# Patient Record
Sex: Male | Born: 1935 | Race: White | Hispanic: No | Marital: Married | Smoking: Former smoker
Health system: Southern US, Community
[De-identification: ages and names within clinical notes are randomized; demographics above are authoritative.]

## PROBLEM LIST (undated history)

## (undated) DIAGNOSIS — R7881 Bacteremia: Secondary | ICD-10-CM

## (undated) DIAGNOSIS — I509 Heart failure, unspecified: Secondary | ICD-10-CM

## (undated) DIAGNOSIS — Z8489 Family history of other specified conditions: Secondary | ICD-10-CM

## (undated) DIAGNOSIS — I472 Ventricular tachycardia, unspecified: Secondary | ICD-10-CM

## (undated) DIAGNOSIS — Z8719 Personal history of other diseases of the digestive system: Secondary | ICD-10-CM

## (undated) DIAGNOSIS — L719 Rosacea, unspecified: Secondary | ICD-10-CM

## (undated) DIAGNOSIS — N289 Disorder of kidney and ureter, unspecified: Secondary | ICD-10-CM

## (undated) DIAGNOSIS — Z87442 Personal history of urinary calculi: Secondary | ICD-10-CM

## (undated) DIAGNOSIS — G629 Polyneuropathy, unspecified: Secondary | ICD-10-CM

## (undated) DIAGNOSIS — Z95810 Presence of automatic (implantable) cardiac defibrillator: Secondary | ICD-10-CM

## (undated) DIAGNOSIS — E785 Hyperlipidemia, unspecified: Secondary | ICD-10-CM

## (undated) DIAGNOSIS — T7840XA Allergy, unspecified, initial encounter: Secondary | ICD-10-CM

## (undated) DIAGNOSIS — E349 Endocrine disorder, unspecified: Secondary | ICD-10-CM

## (undated) DIAGNOSIS — C801 Malignant (primary) neoplasm, unspecified: Secondary | ICD-10-CM

## (undated) DIAGNOSIS — E119 Type 2 diabetes mellitus without complications: Secondary | ICD-10-CM

## (undated) DIAGNOSIS — Z8711 Personal history of peptic ulcer disease: Secondary | ICD-10-CM

## (undated) DIAGNOSIS — I2510 Atherosclerotic heart disease of native coronary artery without angina pectoris: Secondary | ICD-10-CM

## (undated) DIAGNOSIS — I219 Acute myocardial infarction, unspecified: Secondary | ICD-10-CM

## (undated) DIAGNOSIS — I499 Cardiac arrhythmia, unspecified: Secondary | ICD-10-CM

## (undated) DIAGNOSIS — Z950 Presence of cardiac pacemaker: Secondary | ICD-10-CM

## (undated) DIAGNOSIS — N200 Calculus of kidney: Secondary | ICD-10-CM

## (undated) DIAGNOSIS — F419 Anxiety disorder, unspecified: Secondary | ICD-10-CM

## (undated) DIAGNOSIS — N400 Enlarged prostate without lower urinary tract symptoms: Secondary | ICD-10-CM

## (undated) DIAGNOSIS — E039 Hypothyroidism, unspecified: Secondary | ICD-10-CM

## (undated) DIAGNOSIS — R251 Tremor, unspecified: Secondary | ICD-10-CM

## (undated) DIAGNOSIS — I10 Essential (primary) hypertension: Secondary | ICD-10-CM

## (undated) DIAGNOSIS — I519 Heart disease, unspecified: Secondary | ICD-10-CM

## (undated) DIAGNOSIS — I255 Ischemic cardiomyopathy: Secondary | ICD-10-CM

## (undated) DIAGNOSIS — G500 Trigeminal neuralgia: Secondary | ICD-10-CM

## (undated) DIAGNOSIS — I4891 Unspecified atrial fibrillation: Secondary | ICD-10-CM

## (undated) DIAGNOSIS — R12 Heartburn: Secondary | ICD-10-CM

## (undated) DIAGNOSIS — B0229 Other postherpetic nervous system involvement: Secondary | ICD-10-CM

## (undated) DIAGNOSIS — K5792 Diverticulitis of intestine, part unspecified, without perforation or abscess without bleeding: Secondary | ICD-10-CM

## (undated) HISTORY — PX: CORONARY ANGIOPLASTY WITH STENT PLACEMENT: SHX49

## (undated) HISTORY — DX: Personal history of peptic ulcer disease: Z87.11

## (undated) HISTORY — PX: THYROID SURGERY: SHX805

## (undated) HISTORY — DX: Hyperlipidemia, unspecified: E78.5

## (undated) HISTORY — PX: INSERT / REPLACE / REMOVE PACEMAKER: SUR710

## (undated) HISTORY — PX: NASAL SEPTUM SURGERY: SHX37

## (undated) HISTORY — PX: OTHER SURGICAL HISTORY: SHX169

## (undated) HISTORY — PX: PACEMAKER INSERTION: SHX728

## (undated) HISTORY — DX: Cardiac arrhythmia, unspecified: I49.9

## (undated) HISTORY — DX: Anxiety disorder, unspecified: F41.9

## (undated) HISTORY — PX: CORONARY ARTERY BYPASS GRAFT: SHX141

## (undated) HISTORY — PX: CERVICAL DISCECTOMY: SHX98

## (undated) HISTORY — DX: Personal history of other diseases of the digestive system: Z87.19

## (undated) HISTORY — PX: PROSTATE SURGERY: SHX751

## (undated) HISTORY — PX: TONSILLECTOMY: SUR1361

## (undated) HISTORY — DX: Calculus of kidney: N20.0

## (undated) HISTORY — PX: CHOLECYSTECTOMY: SHX55

## (undated) HISTORY — DX: Heart disease, unspecified: I51.9

## (undated) HISTORY — PX: APPENDECTOMY: SHX54

## (undated) HISTORY — PX: BACK SURGERY: SHX140

## (undated) HISTORY — DX: Heartburn: R12

---

## 2007-07-17 DIAGNOSIS — D689 Coagulation defect, unspecified: Secondary | ICD-10-CM

## 2007-07-17 HISTORY — DX: Coagulation defect, unspecified: D68.9

## 2009-03-14 ENCOUNTER — Ambulatory Visit: Payer: Self-pay | Admitting: Pain Medicine

## 2009-04-11 ENCOUNTER — Ambulatory Visit: Payer: Self-pay | Admitting: Pain Medicine

## 2009-08-25 DIAGNOSIS — C4491 Basal cell carcinoma of skin, unspecified: Secondary | ICD-10-CM

## 2009-08-25 HISTORY — DX: Basal cell carcinoma of skin, unspecified: C44.91

## 2009-09-19 ENCOUNTER — Ambulatory Visit: Payer: Self-pay | Admitting: Pain Medicine

## 2009-12-25 ENCOUNTER — Ambulatory Visit: Payer: Self-pay | Admitting: Family Medicine

## 2010-08-15 NOTE — Assessment & Plan Note (Signed)
Summary: EYE INFECTION/JBB   Vital Signs:  Patient Profile:   77 Years Old Male CC:      infection of eyes Height:     73 inches Weight:      201 pounds Temp:     97.6 degrees F oral Pulse rate:   60 / minute Pulse rhythm:   regular Resp:     18 per minute BP sitting:   120 / 64  (left arm) Cuff size:   regular  Vitals Entered By: Providence Crosby LPN (December 25, 2009 12:02 PM)                  Current Allergies: ! CIPROHistory of Present Illness Reason for visit: infection of eyes Chief Complaint: infection of eyes History of Present Illness: swimming in pool ; has had it before  REVIEW OF SYSTEMS Constitutional Symptoms      Denies fever, chills, night sweats, weight loss, weight gain, and fatigue.  Eyes       Complains of eye pain and eye drainage.      Denies change in vision, glasses, contact lenses, and eye surgery. Ear/Nose/Throat/Mouth       Denies hearing loss/aids, change in hearing, ear pain, ear discharge, dizziness, frequent runny nose, frequent nose bleeds, sinus problems, sore throat, hoarseness, and tooth pain or bleeding.  Respiratory       Denies dry cough, productive cough, wheezing, shortness of breath, asthma, bronchitis, and emphysema/COPD.  Cardiovascular       Denies murmurs, chest pain, and tires easily with exhertion.    Gastrointestinal       Denies stomach pain, nausea/vomiting, diarrhea, constipation, blood in bowel movements, and indigestion. Genitourniary       Denies painful urination, kidney stones, and loss of urinary control. Neurological       Denies paralysis, seizures, and fainting/blackouts. Musculoskeletal       Denies muscle pain, joint pain, joint stiffness, decreased range of motion, redness, swelling, muscle weakness, and gout.  Skin       Denies bruising, unusual mles/lumps or sores, and hair/skin or nail changes.  Psych       Denies mood changes, temper/anger issues, anxiety/stress, speech problems, depression, and sleep  problems.  Past History:  Past Medical History: heart disease // pacemaker defibulator  Family History: Father:Deceased at age 24 COPD Mother: Deceased at age 69 heart disease Siblings: 1 brother 39 yoa   Social History: Marital Status: Married Children:  Occupation: retired Radio producer  Physical Exam General appearance: well developed, well nourished, no acute distress Head: normocephalic, atraumatic Eyes: injected conjunctivae Pupils: equal, round, reactive to light Ears: normal, no lesions or deformities Neck: neck supple,  trachea midline, no masses Chest/Lungs: no rales, wheezes, or rhonchi bilateral, breath sounds equal without effort Skin: Mild sunburn on face, nose Assessment New Problems: HYPERTENSION (ICD-401.1) HYPERLIPIDEMIA (ICD-272.4) DIABETES-TYPE II NIDDM, UNSPEC (ICD-250.00)   Plan New Medications/Changes: SULFACETAMIDE SODIUM 10 % SOLN (SULFACETAMIDE SODIUM) 1 or 2 drops both eyes every 4 hours  #15 mls x 0, 12/25/2009, Providence Crosby LPN   The patient and/or caregiver has been counseled thoroughly with regard to medications prescribed including dosage, schedule, interactions, rationale for use, and possible side effects and they verbalize understanding.  Diagnoses and expected course of recovery discussed and will return if not improved as expected or if the condition worsens. Patient and/or caregiver verbalized understanding.  Prescriptions: SULFACETAMIDE SODIUM 10 % SOLN (SULFACETAMIDE SODIUM) 1 or 2 drops both eyes every 4 hours  #  15 mls x 0   Entered by:   Providence Crosby LPN   Authorized by:   Kathrynn Running MD   Signed by:   Providence Crosby LPN on [id number]/9629   Method used:   Electronically to        [id number] (retail)       [address]       [place], [place]  [address]       Ph: [phone]       Fax: [phone]   RxID:   [id number]   The patient was informed that there is no on-call provider or services available at  this clinic during off-hours (when the clinic is closed).  If the patient developed a problem or concern that required immediate attention, the patient was advised to go the the nearest available urgent care or emergency department for medical care.  The patient verbalized understanding.     It was clearly explained to the patient that this [facility] is not intended to be a primary care clinic.  The patient is always better served by the continuity of care and the provider/patient relationships developed with their dedicated primary care provider.  The patient was told to be sure to follow up as soon as possible with their primary care provider to discuss treatments received and to receive further examination and testing.  The patient verbalized understanding. The will f/u with PCP ASAP.   The risks, benefits and possible side effects were clearly explained and discussed with the patient.  The patient verbalized clear understanding.  The patient was given instructions to return if symptoms don't improve, worsen or new changes develop.  If it is not during clinic hours and the patient cannot get back to this clinic then the patient was told to seek medical care at an available urgent care or emergency department.  The patient verbalized understanding.    I have reviewed the above medical office visit documention, including diagnoses, history, medications, clinical lists, orders and plan of care.   Rodney Langton, MD, FAAFP  January 09, 2010 Added new allergy or adverse reaction of CIPRO - Signed

## 2011-05-10 ENCOUNTER — Ambulatory Visit: Payer: Self-pay | Admitting: Internal Medicine

## 2011-05-17 ENCOUNTER — Ambulatory Visit: Payer: Self-pay | Admitting: Internal Medicine

## 2012-10-30 ENCOUNTER — Ambulatory Visit: Payer: Self-pay | Admitting: Unknown Physician Specialty

## 2012-10-31 LAB — PATHOLOGY REPORT

## 2012-11-04 ENCOUNTER — Ambulatory Visit: Payer: Self-pay | Admitting: Unknown Physician Specialty

## 2013-04-07 DIAGNOSIS — C4490 Unspecified malignant neoplasm of skin, unspecified: Secondary | ICD-10-CM

## 2013-04-07 HISTORY — DX: Unspecified malignant neoplasm of skin, unspecified: C44.90

## 2013-09-24 ENCOUNTER — Ambulatory Visit: Payer: Self-pay | Admitting: Unknown Physician Specialty

## 2014-07-14 ENCOUNTER — Telehealth: Payer: Self-pay | Admitting: Neurology

## 2014-07-14 NOTE — Telephone Encounter (Signed)
Pt called/returning your call. C/B [phone]

## 2014-08-31 DIAGNOSIS — I25118 Atherosclerotic heart disease of native coronary artery with other forms of angina pectoris: Secondary | ICD-10-CM | POA: Diagnosis not present

## 2014-08-31 DIAGNOSIS — Z8679 Personal history of other diseases of the circulatory system: Secondary | ICD-10-CM | POA: Diagnosis not present

## 2014-08-31 DIAGNOSIS — E782 Mixed hyperlipidemia: Secondary | ICD-10-CM | POA: Diagnosis not present

## 2014-08-31 DIAGNOSIS — I10 Essential (primary) hypertension: Secondary | ICD-10-CM | POA: Diagnosis not present

## 2014-09-28 DIAGNOSIS — I255 Ischemic cardiomyopathy: Secondary | ICD-10-CM | POA: Diagnosis not present

## 2014-09-28 DIAGNOSIS — Z125 Encounter for screening for malignant neoplasm of prostate: Secondary | ICD-10-CM | POA: Diagnosis not present

## 2014-09-28 DIAGNOSIS — E039 Hypothyroidism, unspecified: Secondary | ICD-10-CM | POA: Diagnosis not present

## 2014-09-28 DIAGNOSIS — I10 Essential (primary) hypertension: Secondary | ICD-10-CM | POA: Diagnosis not present

## 2014-09-28 DIAGNOSIS — I472 Ventricular tachycardia: Secondary | ICD-10-CM | POA: Diagnosis not present

## 2014-09-28 DIAGNOSIS — E119 Type 2 diabetes mellitus without complications: Secondary | ICD-10-CM | POA: Diagnosis not present

## 2014-10-05 DIAGNOSIS — E782 Mixed hyperlipidemia: Secondary | ICD-10-CM | POA: Diagnosis not present

## 2014-10-05 DIAGNOSIS — E039 Hypothyroidism, unspecified: Secondary | ICD-10-CM | POA: Diagnosis not present

## 2014-10-05 DIAGNOSIS — E119 Type 2 diabetes mellitus without complications: Secondary | ICD-10-CM | POA: Diagnosis not present

## 2014-10-05 DIAGNOSIS — I25810 Atherosclerosis of coronary artery bypass graft(s) without angina pectoris: Secondary | ICD-10-CM | POA: Diagnosis not present

## 2015-01-04 DIAGNOSIS — I472 Ventricular tachycardia: Secondary | ICD-10-CM | POA: Diagnosis not present

## 2015-03-03 DIAGNOSIS — I472 Ventricular tachycardia: Secondary | ICD-10-CM | POA: Diagnosis not present

## 2015-03-03 DIAGNOSIS — I255 Ischemic cardiomyopathy: Secondary | ICD-10-CM | POA: Diagnosis not present

## 2015-03-03 DIAGNOSIS — E782 Mixed hyperlipidemia: Secondary | ICD-10-CM | POA: Diagnosis not present

## 2015-03-03 DIAGNOSIS — I10 Essential (primary) hypertension: Secondary | ICD-10-CM | POA: Diagnosis not present

## 2015-03-15 DIAGNOSIS — I25810 Atherosclerosis of coronary artery bypass graft(s) without angina pectoris: Secondary | ICD-10-CM | POA: Diagnosis not present

## 2015-03-15 DIAGNOSIS — I213 ST elevation (STEMI) myocardial infarction of unspecified site: Secondary | ICD-10-CM | POA: Diagnosis not present

## 2015-03-15 DIAGNOSIS — I255 Ischemic cardiomyopathy: Secondary | ICD-10-CM | POA: Diagnosis not present

## 2015-03-24 DIAGNOSIS — I480 Paroxysmal atrial fibrillation: Secondary | ICD-10-CM | POA: Diagnosis not present

## 2015-04-07 DIAGNOSIS — H60332 Swimmer's ear, left ear: Secondary | ICD-10-CM | POA: Diagnosis not present

## 2015-04-07 DIAGNOSIS — I480 Paroxysmal atrial fibrillation: Secondary | ICD-10-CM | POA: Diagnosis not present

## 2015-04-07 DIAGNOSIS — H6123 Impacted cerumen, bilateral: Secondary | ICD-10-CM | POA: Diagnosis not present

## 2015-04-12 DIAGNOSIS — I255 Ischemic cardiomyopathy: Secondary | ICD-10-CM | POA: Diagnosis not present

## 2015-04-12 DIAGNOSIS — I213 ST elevation (STEMI) myocardial infarction of unspecified site: Secondary | ICD-10-CM | POA: Diagnosis not present

## 2015-04-12 DIAGNOSIS — I25810 Atherosclerosis of coronary artery bypass graft(s) without angina pectoris: Secondary | ICD-10-CM | POA: Diagnosis not present

## 2015-04-12 DIAGNOSIS — I472 Ventricular tachycardia: Secondary | ICD-10-CM | POA: Diagnosis not present

## 2015-05-05 DIAGNOSIS — I480 Paroxysmal atrial fibrillation: Secondary | ICD-10-CM | POA: Diagnosis not present

## 2015-05-24 DIAGNOSIS — I480 Paroxysmal atrial fibrillation: Secondary | ICD-10-CM | POA: Diagnosis not present

## 2015-06-04 DIAGNOSIS — Z794 Long term (current) use of insulin: Secondary | ICD-10-CM | POA: Diagnosis not present

## 2015-06-04 DIAGNOSIS — R739 Hyperglycemia, unspecified: Secondary | ICD-10-CM | POA: Diagnosis not present

## 2015-06-04 DIAGNOSIS — E119 Type 2 diabetes mellitus without complications: Secondary | ICD-10-CM | POA: Diagnosis not present

## 2015-06-06 DIAGNOSIS — H5210 Myopia, unspecified eye: Secondary | ICD-10-CM | POA: Diagnosis not present

## 2015-06-06 DIAGNOSIS — Z794 Long term (current) use of insulin: Secondary | ICD-10-CM | POA: Diagnosis not present

## 2015-06-06 DIAGNOSIS — E119 Type 2 diabetes mellitus without complications: Secondary | ICD-10-CM | POA: Diagnosis not present

## 2015-06-06 DIAGNOSIS — H524 Presbyopia: Secondary | ICD-10-CM | POA: Diagnosis not present

## 2015-06-16 DIAGNOSIS — I480 Paroxysmal atrial fibrillation: Secondary | ICD-10-CM | POA: Diagnosis not present

## 2015-06-21 ENCOUNTER — Emergency Department
Admission: EM | Admit: 2015-06-21 | Discharge: 2015-06-21 | Disposition: A | Discharge status: Home / Self Care | Payer: Commercial Managed Care - HMO | Attending: Emergency Medicine | Admitting: Emergency Medicine

## 2015-06-21 ENCOUNTER — Emergency Department: Payer: Commercial Managed Care - HMO

## 2015-06-21 ENCOUNTER — Encounter: Payer: Self-pay | Admitting: Emergency Medicine

## 2015-06-21 DIAGNOSIS — R309 Painful micturition, unspecified: Secondary | ICD-10-CM | POA: Insufficient documentation

## 2015-06-21 DIAGNOSIS — I10 Essential (primary) hypertension: Secondary | ICD-10-CM | POA: Insufficient documentation

## 2015-06-21 DIAGNOSIS — Z87891 Personal history of nicotine dependence: Secondary | ICD-10-CM | POA: Diagnosis not present

## 2015-06-21 DIAGNOSIS — R102 Pelvic and perineal pain: Secondary | ICD-10-CM | POA: Insufficient documentation

## 2015-06-21 DIAGNOSIS — R059 Cough, unspecified: Secondary | ICD-10-CM

## 2015-06-21 DIAGNOSIS — E1165 Type 2 diabetes mellitus with hyperglycemia: Secondary | ICD-10-CM | POA: Diagnosis not present

## 2015-06-21 DIAGNOSIS — Z794 Long term (current) use of insulin: Secondary | ICD-10-CM | POA: Diagnosis not present

## 2015-06-21 DIAGNOSIS — R002 Palpitations: Secondary | ICD-10-CM | POA: Insufficient documentation

## 2015-06-21 DIAGNOSIS — Z950 Presence of cardiac pacemaker: Secondary | ICD-10-CM | POA: Diagnosis not present

## 2015-06-21 DIAGNOSIS — R1030 Lower abdominal pain, unspecified: Secondary | ICD-10-CM | POA: Insufficient documentation

## 2015-06-21 DIAGNOSIS — N23 Unspecified renal colic: Secondary | ICD-10-CM | POA: Diagnosis not present

## 2015-06-21 DIAGNOSIS — R05 Cough: Secondary | ICD-10-CM | POA: Diagnosis not present

## 2015-06-21 HISTORY — DX: Type 2 diabetes mellitus without complications: E11.9

## 2015-06-21 HISTORY — DX: Essential (primary) hypertension: I10

## 2015-06-21 LAB — CBC
HCT: 44.8 % (ref 40.0–52.0)
Hemoglobin: 14.8 g/dL (ref 13.0–18.0)
MCH: 31.4 pg (ref 26.0–34.0)
MCHC: 33 g/dL (ref 32.0–36.0)
MCV: 95.1 fL (ref 80.0–100.0)
Platelets: 147 10*3/uL — ABNORMAL LOW (ref 150–440)
RBC: 4.72 MIL/uL (ref 4.40–5.90)
RDW: 13.8 % (ref 11.5–14.5)
WBC: 5.5 10*3/uL (ref 3.8–10.6)

## 2015-06-21 LAB — URINALYSIS COMPLETE WITH MICROSCOPIC (ARMC ONLY)
Bacteria, UA: NONE SEEN
Bilirubin Urine: NEGATIVE
Glucose, UA: >500 mg/dL — AB
Hgb urine dipstick: NEGATIVE
Ketones, ur: NEGATIVE mg/dL
Leukocytes, UA: NEGATIVE
Nitrite: NEGATIVE
Protein, ur: NEGATIVE mg/dL
Specific Gravity, Urine: 1.023 (ref 1.005–1.030)
Squamous Epithelial / LPF: NONE SEEN
pH: 5 (ref 5.0–8.0)

## 2015-06-21 LAB — GLUCOSE, CAPILLARY
Glucose-Capillary: 340 mg/dL — ABNORMAL HIGH (ref 65–99)
Glucose-Capillary: 159 mg/dL — ABNORMAL HIGH (ref 65–99)

## 2015-06-21 LAB — BASIC METABOLIC PANEL
Anion gap: 8 (ref 5–15)
BUN: 17 mg/dL (ref 6–20)
CO2: 28 mmol/L (ref 22–32)
Calcium: 9.6 mg/dL (ref 8.9–10.3)
Chloride: 101 mmol/L (ref 101–111)
Creatinine, Ser: 0.9 mg/dL (ref 0.61–1.24)
GFR calc Af Amer: >60 mL/min (ref >60)
GFR calc non Af Amer: >60 mL/min (ref >60)
Glucose, Bld: 342 mg/dL — ABNORMAL HIGH (ref 65–99)
Potassium: 3.6 mmol/L (ref 3.5–5.1)
Sodium: 137 mmol/L (ref 135–145)

## 2015-06-21 LAB — TROPONIN I: Troponin I: <0.03 ng/mL (ref <0.031)

## 2015-06-21 MED ORDER — INSULIN ASPART 100 UNIT/ML ~~LOC~~ SOLN
10.0000 [IU] | Freq: Once | SUBCUTANEOUS | Status: AC
  Administered 2015-06-21: 10 [IU] via SUBCUTANEOUS
  Filled 2015-06-21: qty 10

## 2015-06-21 MED ORDER — SODIUM CHLORIDE 0.9 % IV BOLUS (SEPSIS)
1000.0000 mL | Freq: Once | INTRAVENOUS | Status: AC
  Administered 2015-06-21: 1000 mL via INTRAVENOUS

## 2015-06-21 NOTE — ED Provider Notes (Signed)
[facility] Emergency Department Provider Note  ____________________________________________  Time seen: Approximately 9:40 PM  I have reviewed the triage vital signs and the nursing notes.   HISTORY  Chief Complaint Hyperglycemia    HPI LARWENCE Thomas is a 81 y.o. male with a history of DM with multiple recent medication changes presenting with hyperglycemia patient states that 6 months ago he was removed from insulin and switched over to metformin, to which she was allergic. He was then stabilized on glipizide for several months but began to feel poorly and was noted to be hyperglycemic. He was then taken off glipizide and put on 10 units of Lantus at night to be increased eye 2 units daily until glycemic control was obtained. He is currently at 56 units of Lantus at night and continues to have blood sugars into the 300s. He has noted painful burning with urination and some mild right lower quadrant and suprapubic pain with urination only. He has also had a cough that is occasionally productive of mucus without any fever, chills, or shortness of breath. He has not had any chest pain other than some localized pain around his pacemaker which is chronic. He has tried to see his primary care physician who has been out of the country.   Past Medical History  Diagnosis Date  . Diabetes mellitus without complication ([place])   . Hypertension     There are no active problems to display for this patient.   No past surgical history on file.  No current outpatient prescriptions on file.  Allergies Ciprofloxacin  No family history on file.  Social History Social History  Substance Use Topics  . Smoking status: Former Research scientist (life sciences)  . Smokeless tobacco: None  . Alcohol Use: Yes    Review of Systems Constitutional: No fever/chills. No lightheadedness or syncope. Eyes: No visual changes. ENT: No sore throat. No congestion. No ear pain. Cardiovascular: Denies chest  pain, positive subcutaneous pain around the area of his pacemaker, palpitations. Respiratory: Denies shortness of breath.  Positive productive cough. Gastrointestinal: No abdominal pain.  No nausea, no vomiting.  No diarrhea.  No constipation. Genitourinary: Positive for pain with urination and mild superior pubic and lower right pelvic pain with urination. No hematuria. Musculoskeletal: Negative for back pain. Skin: Negative for rash. Neurological: Negative for headaches, focal weakness or numbness.  10-point ROS otherwise negative.  ____________________________________________   PHYSICAL EXAM:  VITAL SIGNS: ED Triage Vitals  Enc Vitals Group     BP 06/21/15 1811 169/81 mmHg     Pulse Rate 06/21/15 1811 69     Resp 06/21/15 1811 18     Temp 06/21/15 1811 97.9 F (36.6 C)     Temp Source 06/21/15 1811 Oral     SpO2 06/21/15 1811 98 %     Weight 06/21/15 1811 202 lb (91.627 kg)     Height 06/21/15 1811 6\' 1"  (1.854 m)     Head Cir --      Peak Flow --      Pain Score 06/21/15 2111 1     Pain Loc --      Pain Edu? --      Excl. in [place]? --     Constitutional: Alert and oriented. Well appearing and in no acute distress. Answer question appropriately. Eyes: Conjunctivae are normal.  EOMI. no scleral icterus. Head: Atraumatic. Nose: No congestion/rhinnorhea. Mouth/Throat: Mucous membranes are moist.  Neck: No stridor.  Supple.  No JVD. Cardiovascular: Normal rate, regular  rhythm. No murmurs, rubs or gallops. Pacemaker placement in the left upper chest without any evidence of swelling, erythema or fluctuance over the insertion site. Respiratory: Normal respiratory effort.  No retractions. Lungs CTAB.  No wheezes, rales or ronchi. Gastrointestinal: Soft and nontender. No distention. No peritoneal signs. Abdominal pain is not reproducible on exam. Musculoskeletal: No LE edema.  Neurologic:  Normal speech and language. No gross focal neurologic deficits are appreciated.  Skin:   Skin is warm, dry and intact. No rash noted. Psychiatric: Mood and affect are normal. Speech and behavior are normal.  Normal judgement.  ____________________________________________   LABS (all labs ordered are listed, but only abnormal results are displayed)  Labs Reviewed  BASIC METABOLIC PANEL - Abnormal; Notable for the following:    Glucose, Bld 342 (*)    All other components within normal limits  GLUCOSE, CAPILLARY - Abnormal; Notable for the following:    Glucose-Capillary 340 (*)    All other components within normal limits  CBC - Abnormal; Notable for the following:    Platelets 147 (*)    All other components within normal limits  BLOOD GAS, VENOUS - Abnormal; Notable for the following:    pCO2, Ven 62 (*)    Bicarbonate 34.2 (*)    Acid-Base Excess 6.5 (*)    All other components within normal limits  URINALYSIS COMPLETEWITH MICROSCOPIC ([facility] ONLY) - Abnormal; Notable for the following:    Color, Urine YELLOW (*)    APPearance CLEAR (*)    Glucose, UA >500 (*)    All other components within normal limits  TROPONIN I  CBG MONITORING, ED   ____________________________________________  EKG  ED ECG REPORT I, Eula Listen, the attending physician, personally viewed and interpreted this ECG.   Date: 06/21/2015  EKG Time: 1818  Rate: 68  Rhythm: normal sinus rhythm; occasional PVCs.  Axis: Normal  Intervals:  ST&T Change: No ST elevation.  ____________________________________________  RADIOLOGY  Dg Chest 2 View  06/21/2015  CLINICAL DATA:  Cough. Weakness. Hyperglycemia. Coronary artery disease. EXAM: CHEST  2 VIEW COMPARISON:  None. FINDINGS: Heart size is within normal limits. AICD seen in expected position. Prior CABG noted. Ectasia of the thoracic aorta is demonstrated. Both lungs are clear. No evidence of pneumothorax or pleural effusion. IMPRESSION: No active cardiopulmonary disease. Electronically Signed   By: Earle Gell M.D.   On: 06/21/2015  22:08    ____________________________________________   PROCEDURES  Procedure(s) performed: None  Critical Care performed: No ____________________________________________   INITIAL IMPRESSION / ASSESSMENT AND PLAN / ED COURSE  Pertinent labs & imaging results that were available during my care of the patient were reviewed by me and considered in my medical decision making (see chart for details).  81 y.o. male with a history of diabetes and multiple medication changes over the past few months presenting with hyperglycemia. He does have painful urination as well as a cough. I will look for other possible causes of hypoglycemia including infection. If his workup here is negative, I will plan to have him follow-up with Dr. Carmie End for medication changes.  ----------------------------------------- 10:48 PM on 06/21/2015 -----------------------------------------  The patient has hyperglycemia without DKA. His urinalysis is not suspicious for UTI. He does not have evidence of pneumonia on chest x-ray or on my clinical exam. I'll plan to discharge home with follow-up with his PMD in 1-2 days to revise his insulin regimen.  ____________________________________________  FINAL CLINICAL IMPRESSION(S) / ED DIAGNOSES  Final diagnoses:  Type 2  diabetes mellitus with hyperglycemia, with long-term current use of insulin (HCC)  Urinary pain  Cough      NEW MEDICATIONS STARTED DURING THIS VISIT:  New Prescriptions   No medications on file     Eula Listen, MD 06/21/15 2249

## 2015-06-21 NOTE — ED Notes (Signed)
Pt discharged home.  Pt in NAD.  Discharge instructions given to pt.  Pt voiced understanding.  No questions or concerns at this time.  Items with pt upon discharge.  No items left in ED.

## 2015-06-21 NOTE — Discharge Instructions (Signed)
Please make an appointment to see your regular doctor in the next 1-2 days to reevaluate your insulin regimen.   Please return to the emergency department if you develop nausea or vomiting, abdominal pain, fever, fainting, or any other symptoms concerning to you.

## 2015-06-21 NOTE — ED Notes (Signed)
Pt sent over for high blood sugar. Pt brought over from [facility]

## 2015-06-23 DIAGNOSIS — I472 Ventricular tachycardia: Secondary | ICD-10-CM | POA: Diagnosis not present

## 2015-06-23 LAB — BLOOD GAS, VENOUS
Acid-Base Excess: 6.5 mmol/L — ABNORMAL HIGH (ref 0.0–3.0)
Bicarbonate: 34.2 mEq/L — ABNORMAL HIGH (ref 21.0–28.0)
Patient temperature: 37
pCO2, Ven: 62 mmHg — ABNORMAL HIGH (ref 44.0–60.0)
pH, Ven: 7.35 (ref 7.320–7.430)

## 2015-06-24 DIAGNOSIS — Z794 Long term (current) use of insulin: Secondary | ICD-10-CM | POA: Diagnosis not present

## 2015-06-24 DIAGNOSIS — E782 Mixed hyperlipidemia: Secondary | ICD-10-CM | POA: Diagnosis not present

## 2015-06-24 DIAGNOSIS — I25810 Atherosclerosis of coronary artery bypass graft(s) without angina pectoris: Secondary | ICD-10-CM | POA: Diagnosis not present

## 2015-06-24 DIAGNOSIS — J302 Other seasonal allergic rhinitis: Secondary | ICD-10-CM | POA: Diagnosis not present

## 2015-06-24 DIAGNOSIS — N200 Calculus of kidney: Secondary | ICD-10-CM | POA: Diagnosis not present

## 2015-06-24 DIAGNOSIS — I10 Essential (primary) hypertension: Secondary | ICD-10-CM | POA: Diagnosis not present

## 2015-06-24 DIAGNOSIS — E119 Type 2 diabetes mellitus without complications: Secondary | ICD-10-CM | POA: Diagnosis not present

## 2015-07-01 DIAGNOSIS — J069 Acute upper respiratory infection, unspecified: Secondary | ICD-10-CM | POA: Diagnosis not present

## 2015-07-01 DIAGNOSIS — R509 Fever, unspecified: Secondary | ICD-10-CM | POA: Diagnosis not present

## 2015-07-01 DIAGNOSIS — Z7901 Long term (current) use of anticoagulants: Secondary | ICD-10-CM | POA: Diagnosis not present

## 2015-07-02 ENCOUNTER — Other Ambulatory Visit: Payer: Self-pay

## 2015-07-02 ENCOUNTER — Emergency Department
Admission: EM | Admit: 2015-07-02 | Discharge: 2015-07-02 | Disposition: A | Discharge status: Home / Self Care | Payer: Commercial Managed Care - HMO | Attending: Emergency Medicine | Admitting: Emergency Medicine

## 2015-07-02 ENCOUNTER — Emergency Department: Payer: Commercial Managed Care - HMO

## 2015-07-02 DIAGNOSIS — I10 Essential (primary) hypertension: Secondary | ICD-10-CM | POA: Insufficient documentation

## 2015-07-02 DIAGNOSIS — Z87891 Personal history of nicotine dependence: Secondary | ICD-10-CM | POA: Diagnosis not present

## 2015-07-02 DIAGNOSIS — B349 Viral infection, unspecified: Secondary | ICD-10-CM | POA: Insufficient documentation

## 2015-07-02 DIAGNOSIS — J209 Acute bronchitis, unspecified: Secondary | ICD-10-CM | POA: Diagnosis not present

## 2015-07-02 DIAGNOSIS — E119 Type 2 diabetes mellitus without complications: Secondary | ICD-10-CM | POA: Insufficient documentation

## 2015-07-02 DIAGNOSIS — S299XXA Unspecified injury of thorax, initial encounter: Secondary | ICD-10-CM | POA: Diagnosis not present

## 2015-07-02 DIAGNOSIS — R05 Cough: Secondary | ICD-10-CM | POA: Diagnosis present

## 2015-07-02 LAB — BASIC METABOLIC PANEL
Anion gap: 8 (ref 5–15)
BUN: 14 mg/dL (ref 6–20)
CO2: 29 mmol/L (ref 22–32)
Calcium: 9.1 mg/dL (ref 8.9–10.3)
Chloride: 99 mmol/L — ABNORMAL LOW (ref 101–111)
Creatinine, Ser: 1 mg/dL (ref 0.61–1.24)
GFR calc Af Amer: >60 mL/min (ref >60)
GFR calc non Af Amer: >60 mL/min (ref >60)
Glucose, Bld: 227 mg/dL — ABNORMAL HIGH (ref 65–99)
Potassium: 3.3 mmol/L — ABNORMAL LOW (ref 3.5–5.1)
Sodium: 136 mmol/L (ref 135–145)

## 2015-07-02 LAB — CBC
HCT: 43.7 % (ref 40.0–52.0)
Hemoglobin: 14.2 g/dL (ref 13.0–18.0)
MCH: 30.8 pg (ref 26.0–34.0)
MCHC: 32.4 g/dL (ref 32.0–36.0)
MCV: 94.9 fL (ref 80.0–100.0)
Platelets: 126 10*3/uL — ABNORMAL LOW (ref 150–440)
RBC: 4.6 MIL/uL (ref 4.40–5.90)
RDW: 13.6 % (ref 11.5–14.5)
WBC: 9.4 10*3/uL (ref 3.8–10.6)

## 2015-07-02 MED ORDER — RANITIDINE HCL 150 MG PO CAPS: 150.0000 mg | ORAL_CAPSULE | Freq: Two times a day (BID) | ORAL | Status: DC

## 2015-07-02 MED ORDER — ALBUTEROL SULFATE HFA 108 (90 BASE) MCG/ACT IN AERS: 2.0000 | INHALATION_SPRAY | RESPIRATORY_TRACT | Status: DC | PRN

## 2015-07-02 MED ORDER — DEXAMETHASONE SODIUM PHOSPHATE 10 MG/ML IJ SOLN
10.0000 mg | Freq: Once | INTRAMUSCULAR | Status: AC
  Administered 2015-07-02: 10 mg via INTRAMUSCULAR
  Filled 2015-07-02: qty 1

## 2015-07-02 MED ORDER — FAMOTIDINE 20 MG PO TABS
40.0000 mg | ORAL_TABLET | Freq: Once | ORAL | Status: AC
  Administered 2015-07-02: 40 mg via ORAL
  Filled 2015-07-02: qty 2

## 2015-07-02 MED ORDER — ONDANSETRON 8 MG PO TBDP: 8.0000 mg | ORAL_TABLET | Freq: Three times a day (TID) | ORAL | Status: DC | PRN

## 2015-07-02 NOTE — ED Notes (Signed)
Pt placed on med hold until 1915, pt made aware and verbalized understanding at this time.

## 2015-07-02 NOTE — ED Provider Notes (Signed)
[facility] Emergency Department Provider Note  ____________________________________________  Time seen: 6:20 PM  I have reviewed the triage vital signs and the nursing notes.   HISTORY  Chief Complaint Cough and URI    HPI Dakota Thomas is a 81 y.o. male reports that his wife was sick with a cold about a week ago. Now for the past 2 days he is having generalized weakness, coughing that is worse at night but nonproductive, nausea, diarrhea today that he took antidiarrheals 4. Denies chest pain or shortness of breath. He saw primary care yesterday who started him on Tessalon and azithromycin. He reports that he still not feeling better. He has fallen twice in the last 2 days but denies any serious injuries. Denies syncope but states that he feels a little bit dizzy when he stands up.     Past Medical History  Diagnosis Date  . Diabetes mellitus without complication ([place])   . Hypertension      There are no active problems to display for this patient.    History reviewed. No pertinent past surgical history.   Current Outpatient Rx  Name  Route  Sig  Dispense  Refill  . albuterol (PROVENTIL HFA) 108 (90 BASE) MCG/ACT inhaler   Inhalation   Inhale 2 puffs into the lungs every 4 (four) hours as needed for wheezing or shortness of breath.   1 Inhaler   0   . ondansetron (ZOFRAN ODT) 8 MG disintegrating tablet   Oral   Take 1 tablet (8 mg total) by mouth every 8 (eight) hours as needed for nausea or vomiting.   20 tablet   0   . ranitidine (ZANTAC) 150 MG capsule   Oral   Take 1 capsule (150 mg total) by mouth 2 (two) times daily.   28 capsule   0      Allergies Ciprofloxacin   No family history on file.  Social History Social History  Substance Use Topics  . Smoking status: Former Research scientist (life sciences)  . Smokeless tobacco: None  . Alcohol Use: Yes    Review of Systems  Constitutional:   No fever or chills. No weight changes Eyes:   No  blurry vision or double vision.  ENT:   Positive sore throat. Cardiovascular:   No chest pain. Respiratory:   No dyspnea positive nonproductive cough. Gastrointestinal:   Positive for upper abdominal pain, with diarrhea.  No BRBPR or melena. Genitourinary:   Negative for dysuria, urinary retention, bloody urine, or difficulty urinating. Musculoskeletal:   Negative for back pain. No joint swelling or pain. Skin:   Negative for rash. Neurological:   Negative for headaches, focal weakness or numbness. Psychiatric:  No anxiety or depression.   Endocrine:  No hot/cold intolerance, changes in energy, or sleep difficulty.  10-point ROS otherwise negative.  ____________________________________________   PHYSICAL EXAM:  VITAL SIGNS: ED Triage Vitals  Enc Vitals Group     BP 07/02/15 1549 105/68 mmHg     Pulse Rate 07/02/15 1549 91     Resp 07/02/15 1549 20     Temp 07/02/15 1549 98.9 F (37.2 C)     Temp Source 07/02/15 1549 Oral     SpO2 07/02/15 1549 96 %     Weight 07/02/15 1549 220 lb (99.791 kg)     Height 07/02/15 1549 6\' 1"  (1.854 m)     Head Cir --      Peak Flow --      Pain Score 07/02/15  1549 6     Pain Loc --      Pain Edu? --      Excl. in [place]? --     Vital signs reviewed, nursing assessments reviewed.   Constitutional:   Alert and oriented. Well appearing and in no distress. Eyes:   No scleral icterus. No conjunctival pallor. PERRL. EOMI ENT   Head:   Normocephalic and atraumatic.   Nose:   No congestion/rhinnorhea. No septal hematoma   Mouth/Throat:   MMM, mild pharyngeal erythema. No peritonsillar mass. No uvula shift.   Neck:   No stridor. No SubQ emphysema. No meningismus. Hematological/Lymphatic/Immunilogical:   No cervical lymphadenopathy. Cardiovascular:   RRR. Normal and symmetric distal pulses are present in all extremities. No murmurs, rubs, or gallops. Respiratory:   Normal respiratory effort without tachypnea nor retractions. Breath  sounds are clear and equal bilaterally. No wheezes/rales/rhonchi. There is inducible wheezing with forceful expiration Gastrointestinal:   Soft with mild left upper quadrant tenderness. No distention. There is no CVA tenderness.  No rebound, rigidity, or guarding. Genitourinary:   deferred Musculoskeletal:   Nontender with normal range of motion in all extremities. No joint effusions.  No lower extremity tenderness.  No edema. Neurologic:   Normal speech and language.  CN 2-10 normal. Motor grossly intact. No pronator drift.  Normal gait. No gross focal neurologic deficits are appreciated.  Skin:    Skin is warm, dry and intact. No rash noted.  No petechiae, purpura, or bullae. Psychiatric:   Mood and affect are normal. Speech and behavior are normal. Patient exhibits appropriate insight and judgment.  ____________________________________________    LABS (pertinent positives/negatives) (all labs ordered are listed, but only abnormal results are displayed) Labs Reviewed  BASIC METABOLIC PANEL - Abnormal; Notable for the following:    Potassium 3.3 (*)    Chloride 99 (*)    Glucose, Bld 227 (*)    All other components within normal limits  CBC - Abnormal; Notable for the following:    Platelets 126 (*)    All other components within normal limits  CULTURE, GROUP A STREP ([facility] ONLY)  CBG MONITORING, ED   ____________________________________________   EKG  Interpreted by me Normal sinus rhythm rate of 94, normal axis and intervals. There is poor R-wave progression in anterior precordial leads. Normal ST segments and T waves. Irregular upslope of the R waves but not consistent with WPW.  ____________________________________________    RADIOLOGY  Chest x-ray unremarkable  ____________________________________________   PROCEDURES   ____________________________________________   INITIAL IMPRESSION / ASSESSMENT AND PLAN / ED COURSE  Pertinent labs & imaging results that  were available during my care of the patient were reviewed by me and considered in my medical decision making (see chart for details).  Vision well-appearing no acute distress. Vital signs are stable and normal. He presented a viral syndrome. He started and started on azithromycin by primary care. Also start him on albuterol give him a dose of Decadron and start him on acid suppression to help maximize symptom relief and assist with maintaining hydration during the course of his viral illness.     ____________________________________________   FINAL CLINICAL IMPRESSION(S) / ED DIAGNOSES  Final diagnoses:  Acute bronchitis, unspecified organism  Viral syndrome      Carrie Mew, MD 07/02/15 [phone]

## 2015-07-02 NOTE — Discharge Instructions (Signed)
Acute Bronchitis Bronchitis is inflammation of the airways that extend from the windpipe into the lungs (bronchi). The inflammation often causes mucus to develop. This leads to a cough, which is the most common symptom of bronchitis.  In acute bronchitis, the condition usually develops suddenly and goes away over time, usually in a couple weeks. Smoking, allergies, and asthma can make bronchitis worse. Repeated episodes of bronchitis may cause further lung problems.  CAUSES Acute bronchitis is most often caused by the same virus that causes a cold. The virus can spread from person to person (contagious) through coughing, sneezing, and touching contaminated objects. SIGNS AND SYMPTOMS   Cough.   Fever.   Coughing up mucus.   Body aches.   Chest congestion.   Chills.   Shortness of breath.   Sore throat.  DIAGNOSIS  Acute bronchitis is usually diagnosed through a physical exam. Your health care provider will also ask you questions about your medical history. Tests, such as chest X-rays, are sometimes done to rule out other conditions.  TREATMENT  Acute bronchitis usually goes away in a couple weeks. Oftentimes, no medical treatment is necessary. Medicines are sometimes given for relief of fever or cough. Antibiotic medicines are usually not needed but may be prescribed in certain situations. In some cases, an inhaler may be recommended to help reduce shortness of breath and control the cough. A cool mist vaporizer may also be used to help thin bronchial secretions and make it easier to clear the chest.  HOME CARE INSTRUCTIONS  Get plenty of rest.   Drink enough fluids to keep your urine clear or pale yellow (unless you have a medical condition that requires fluid restriction). Increasing fluids may help thin your respiratory secretions (sputum) and reduce chest congestion, and it will prevent dehydration.   Take medicines only as directed by your health care provider.  If  you were prescribed an antibiotic medicine, finish it all even if you start to feel better.  Avoid smoking and secondhand smoke. Exposure to cigarette smoke or irritating chemicals will make bronchitis worse. If you are a smoker, consider using nicotine gum or skin patches to help control withdrawal symptoms. Quitting smoking will help your lungs heal faster.   Reduce the chances of another bout of acute bronchitis by washing your hands frequently, avoiding people with cold symptoms, and trying not to touch your hands to your mouth, nose, or eyes.   Keep all follow-up visits as directed by your health care provider.  SEEK MEDICAL CARE IF: Your symptoms do not improve after 1 week of treatment.  SEEK IMMEDIATE MEDICAL CARE IF:  You develop an increased fever or chills.   You have chest pain.   You have severe shortness of breath.  You have bloody sputum.   You develop dehydration.  You faint or repeatedly feel like you are going to pass out.  You develop repeated vomiting.  You develop a severe headache. MAKE SURE YOU:   Understand these instructions.  Will watch your condition.  Will get help right away if you are not doing well or get worse.   This information is not intended to replace advice given to you by your health care provider. Make sure you discuss any questions you have with your health care provider.   Document Released: 08/09/2004 Document Revised: 07/23/2014 Document Reviewed: 12/23/2012 Elsevier Interactive Patient Education 2016 Elsevier Inc.  Viral Infections A viral infection can be caused by different types of viruses.Most viral infections are not serious  and resolve on their own. However, some infections may cause severe symptoms and may lead to further complications. SYMPTOMS Viruses can frequently cause:  Minor sore throat.  Aches and pains.  Headaches.  Runny nose.  Different types of rashes.  Watery  eyes.  Tiredness.  Cough.  Loss of appetite.  Gastrointestinal infections, resulting in nausea, vomiting, and diarrhea. These symptoms do not respond to antibiotics because the infection is not caused by bacteria. However, you might catch a bacterial infection following the viral infection. This is sometimes called a "superinfection." Symptoms of such a bacterial infection may include:  Worsening sore throat with pus and difficulty swallowing.  Swollen neck glands.  Chills and a high or persistent fever.  Severe headache.  Tenderness over the sinuses.  Persistent overall ill feeling (malaise), muscle aches, and tiredness (fatigue).  Persistent cough.  Yellow, green, or brown mucus production with coughing. HOME CARE INSTRUCTIONS   Only take over-the-counter or prescription medicines for pain, discomfort, diarrhea, or fever as directed by your caregiver.  Drink enough water and fluids to keep your urine clear or pale yellow. Sports drinks can provide valuable electrolytes, sugars, and hydration.  Get plenty of rest and maintain proper nutrition. Soups and broths with crackers or rice are fine. SEEK IMMEDIATE MEDICAL CARE IF:   You have severe headaches, shortness of breath, chest pain, neck pain, or an unusual rash.  You have uncontrolled vomiting, diarrhea, or you are unable to keep down fluids.  You or your child has an oral temperature above 102 F (38.9 C), not controlled by medicine.  Your baby is older than 3 months with a rectal temperature of 102 F (38.9 C) or higher.  Your baby is 53 months old or younger with a rectal temperature of 100.4 F (38 C) or higher. MAKE SURE YOU:   Understand these instructions.  Will watch your condition.  Will get help right away if you are not doing well or get worse.   This information is not intended to replace advice given to you by your health care provider. Make sure you discuss any questions you have with your health  care provider.   Document Released: 04/11/2005 Document Revised: 09/24/2011 Document Reviewed: 12/08/2014 Elsevier Interactive Patient Education [employer].

## 2015-07-02 NOTE — ED Notes (Signed)
Pt states that he is experiencing cold symptoms, weakness X 2 days. Fell X 2 times last night while trying to go to the bathroom due to weakness. Pt seen at PCP yesterday and prescribed mediations but states that he is feeling worse. Pt hands noticeably trembling.

## 2015-07-05 LAB — POCT RAPID STREP A: Streptococcus, Group A Screen (Direct): NEGATIVE

## 2015-07-05 LAB — CULTURE, GROUP A STREP (THRC)

## 2015-07-06 DIAGNOSIS — Z794 Long term (current) use of insulin: Secondary | ICD-10-CM | POA: Diagnosis not present

## 2015-07-06 DIAGNOSIS — J40 Bronchitis, not specified as acute or chronic: Secondary | ICD-10-CM | POA: Diagnosis not present

## 2015-07-06 DIAGNOSIS — I25810 Atherosclerosis of coronary artery bypass graft(s) without angina pectoris: Secondary | ICD-10-CM | POA: Diagnosis not present

## 2015-07-06 DIAGNOSIS — E119 Type 2 diabetes mellitus without complications: Secondary | ICD-10-CM | POA: Diagnosis not present

## 2015-07-06 DIAGNOSIS — I480 Paroxysmal atrial fibrillation: Secondary | ICD-10-CM | POA: Diagnosis not present

## 2015-07-17 DIAGNOSIS — I4891 Unspecified atrial fibrillation: Secondary | ICD-10-CM

## 2015-07-17 HISTORY — DX: Unspecified atrial fibrillation: I48.91

## 2015-07-27 DIAGNOSIS — I480 Paroxysmal atrial fibrillation: Secondary | ICD-10-CM | POA: Diagnosis not present

## 2015-08-09 DIAGNOSIS — I472 Ventricular tachycardia: Secondary | ICD-10-CM | POA: Diagnosis not present

## 2015-08-09 DIAGNOSIS — I25810 Atherosclerosis of coronary artery bypass graft(s) without angina pectoris: Secondary | ICD-10-CM | POA: Diagnosis not present

## 2015-08-09 DIAGNOSIS — I255 Ischemic cardiomyopathy: Secondary | ICD-10-CM | POA: Diagnosis not present

## 2015-08-09 DIAGNOSIS — E782 Mixed hyperlipidemia: Secondary | ICD-10-CM | POA: Diagnosis not present

## 2015-08-09 DIAGNOSIS — I480 Paroxysmal atrial fibrillation: Secondary | ICD-10-CM | POA: Diagnosis not present

## 2015-08-09 DIAGNOSIS — I10 Essential (primary) hypertension: Secondary | ICD-10-CM | POA: Diagnosis not present

## 2015-08-09 DIAGNOSIS — E119 Type 2 diabetes mellitus without complications: Secondary | ICD-10-CM | POA: Diagnosis not present

## 2015-08-09 DIAGNOSIS — Z794 Long term (current) use of insulin: Secondary | ICD-10-CM | POA: Diagnosis not present

## 2015-08-09 DIAGNOSIS — I340 Nonrheumatic mitral (valve) insufficiency: Secondary | ICD-10-CM | POA: Diagnosis not present

## 2015-08-17 DIAGNOSIS — L578 Other skin changes due to chronic exposure to nonionizing radiation: Secondary | ICD-10-CM | POA: Diagnosis not present

## 2015-08-17 DIAGNOSIS — L820 Inflamed seborrheic keratosis: Secondary | ICD-10-CM | POA: Diagnosis not present

## 2015-08-17 DIAGNOSIS — D485 Neoplasm of uncertain behavior of skin: Secondary | ICD-10-CM | POA: Diagnosis not present

## 2015-08-17 DIAGNOSIS — L720 Epidermal cyst: Secondary | ICD-10-CM | POA: Diagnosis not present

## 2015-08-17 DIAGNOSIS — D1800 Hemangioma unspecified site: Secondary | ICD-10-CM | POA: Diagnosis not present

## 2015-08-17 DIAGNOSIS — D0462 Carcinoma in situ of skin of left upper limb, including shoulder: Secondary | ICD-10-CM | POA: Diagnosis not present

## 2015-08-17 DIAGNOSIS — C44311 Basal cell carcinoma of skin of nose: Secondary | ICD-10-CM | POA: Diagnosis not present

## 2015-08-17 DIAGNOSIS — L821 Other seborrheic keratosis: Secondary | ICD-10-CM | POA: Diagnosis not present

## 2015-08-17 DIAGNOSIS — C4492 Squamous cell carcinoma of skin, unspecified: Secondary | ICD-10-CM

## 2015-08-17 HISTORY — DX: Squamous cell carcinoma of skin, unspecified: C44.92

## 2015-08-24 DIAGNOSIS — M545 Low back pain: Secondary | ICD-10-CM | POA: Diagnosis not present

## 2015-08-24 DIAGNOSIS — M5136 Other intervertebral disc degeneration, lumbar region: Secondary | ICD-10-CM | POA: Diagnosis not present

## 2015-09-06 DIAGNOSIS — I340 Nonrheumatic mitral (valve) insufficiency: Secondary | ICD-10-CM | POA: Diagnosis not present

## 2015-09-06 DIAGNOSIS — I255 Ischemic cardiomyopathy: Secondary | ICD-10-CM | POA: Diagnosis not present

## 2015-09-06 DIAGNOSIS — E119 Type 2 diabetes mellitus without complications: Secondary | ICD-10-CM | POA: Diagnosis not present

## 2015-09-06 DIAGNOSIS — Z794 Long term (current) use of insulin: Secondary | ICD-10-CM | POA: Diagnosis not present

## 2015-09-06 DIAGNOSIS — I25810 Atherosclerosis of coronary artery bypass graft(s) without angina pectoris: Secondary | ICD-10-CM | POA: Diagnosis not present

## 2015-09-06 DIAGNOSIS — I472 Ventricular tachycardia: Secondary | ICD-10-CM | POA: Diagnosis not present

## 2015-09-06 DIAGNOSIS — E782 Mixed hyperlipidemia: Secondary | ICD-10-CM | POA: Diagnosis not present

## 2015-09-06 DIAGNOSIS — I480 Paroxysmal atrial fibrillation: Secondary | ICD-10-CM | POA: Diagnosis not present

## 2015-09-06 DIAGNOSIS — I10 Essential (primary) hypertension: Secondary | ICD-10-CM | POA: Diagnosis not present

## 2015-09-08 DIAGNOSIS — Z85828 Personal history of other malignant neoplasm of skin: Secondary | ICD-10-CM | POA: Diagnosis not present

## 2015-09-08 DIAGNOSIS — L578 Other skin changes due to chronic exposure to nonionizing radiation: Secondary | ICD-10-CM | POA: Diagnosis not present

## 2015-09-08 DIAGNOSIS — D0462 Carcinoma in situ of skin of left upper limb, including shoulder: Secondary | ICD-10-CM | POA: Diagnosis not present

## 2015-09-30 DIAGNOSIS — Z0100 Encounter for examination of eyes and vision without abnormal findings: Secondary | ICD-10-CM | POA: Diagnosis not present

## 2015-10-01 ENCOUNTER — Emergency Department: Payer: Commercial Managed Care - HMO

## 2015-10-01 ENCOUNTER — Emergency Department
Admission: EM | Admit: 2015-10-01 | Discharge: 2015-10-01 | Disposition: A | Discharge status: Home / Self Care | Payer: Commercial Managed Care - HMO | Attending: Emergency Medicine | Admitting: Emergency Medicine

## 2015-10-01 ENCOUNTER — Encounter: Payer: Self-pay | Admitting: Emergency Medicine

## 2015-10-01 DIAGNOSIS — M545 Low back pain: Secondary | ICD-10-CM | POA: Diagnosis not present

## 2015-10-01 DIAGNOSIS — Z79899 Other long term (current) drug therapy: Secondary | ICD-10-CM | POA: Diagnosis not present

## 2015-10-01 DIAGNOSIS — Z8674 Personal history of sudden cardiac arrest: Secondary | ICD-10-CM | POA: Insufficient documentation

## 2015-10-01 DIAGNOSIS — E119 Type 2 diabetes mellitus without complications: Secondary | ICD-10-CM | POA: Diagnosis not present

## 2015-10-01 DIAGNOSIS — R1031 Right lower quadrant pain: Secondary | ICD-10-CM | POA: Diagnosis present

## 2015-10-01 DIAGNOSIS — Z87891 Personal history of nicotine dependence: Secondary | ICD-10-CM | POA: Insufficient documentation

## 2015-10-01 DIAGNOSIS — N289 Disorder of kidney and ureter, unspecified: Secondary | ICD-10-CM | POA: Diagnosis not present

## 2015-10-01 DIAGNOSIS — I10 Essential (primary) hypertension: Secondary | ICD-10-CM | POA: Diagnosis not present

## 2015-10-01 DIAGNOSIS — R109 Unspecified abdominal pain: Secondary | ICD-10-CM | POA: Diagnosis not present

## 2015-10-01 HISTORY — DX: Disorder of kidney and ureter, unspecified: N28.9

## 2015-10-01 HISTORY — DX: Acute myocardial infarction, unspecified: I21.9

## 2015-10-01 HISTORY — DX: Presence of cardiac pacemaker: Z95.0

## 2015-10-01 LAB — CBC
HCT: 41.9 % (ref 40.0–52.0)
Hemoglobin: 14.1 g/dL (ref 13.0–18.0)
MCH: 31.4 pg (ref 26.0–34.0)
MCHC: 33.6 g/dL (ref 32.0–36.0)
MCV: 93.6 fL (ref 80.0–100.0)
Platelets: 142 10*3/uL — ABNORMAL LOW (ref 150–440)
RBC: 4.48 MIL/uL (ref 4.40–5.90)
RDW: 15.2 % — ABNORMAL HIGH (ref 11.5–14.5)
WBC: 6.5 10*3/uL (ref 3.8–10.6)

## 2015-10-01 LAB — URINALYSIS COMPLETE WITH MICROSCOPIC (ARMC ONLY)
Bacteria, UA: NONE SEEN
Bilirubin Urine: NEGATIVE
Glucose, UA: 50 mg/dL — AB
Hgb urine dipstick: NEGATIVE
Ketones, ur: NEGATIVE mg/dL
Leukocytes, UA: NEGATIVE
Nitrite: NEGATIVE
Protein, ur: NEGATIVE mg/dL
RBC / HPF: NONE SEEN RBC/hpf (ref 0–5)
Specific Gravity, Urine: 1.015 (ref 1.005–1.030)
pH: 7 (ref 5.0–8.0)

## 2015-10-01 LAB — COMPREHENSIVE METABOLIC PANEL
ALT: 28 U/L (ref 17–63)
AST: 40 U/L (ref 15–41)
Albumin: 3.9 g/dL (ref 3.5–5.0)
Alkaline Phosphatase: 64 U/L (ref 38–126)
Anion gap: 4 — ABNORMAL LOW (ref 5–15)
BUN: 11 mg/dL (ref 6–20)
CO2: 31 mmol/L (ref 22–32)
Calcium: 9.1 mg/dL (ref 8.9–10.3)
Chloride: 103 mmol/L (ref 101–111)
Creatinine, Ser: 0.87 mg/dL (ref 0.61–1.24)
GFR calc Af Amer: >60 mL/min (ref >60)
GFR calc non Af Amer: >60 mL/min (ref >60)
Glucose, Bld: 133 mg/dL — ABNORMAL HIGH (ref 65–99)
Potassium: 3.9 mmol/L (ref 3.5–5.1)
Sodium: 138 mmol/L (ref 135–145)
Total Bilirubin: 0.8 mg/dL (ref 0.3–1.2)
Total Protein: 7.3 g/dL (ref 6.5–8.1)

## 2015-10-01 LAB — LIPASE, BLOOD: Lipase: 20 U/L (ref 11–51)

## 2015-10-01 MED ORDER — DIAZEPAM 5 MG PO TABS: 5.0000 mg | ORAL_TABLET | Freq: Three times a day (TID) | ORAL | Status: DC | PRN

## 2015-10-01 MED ORDER — IBUPROFEN 600 MG PO TABS: 600.0000 mg | ORAL_TABLET | Freq: Three times a day (TID) | ORAL | Status: DC | PRN

## 2015-10-01 MED ORDER — OXYCODONE-ACETAMINOPHEN 5-325 MG PO TABS
2.0000 | ORAL_TABLET | Freq: Once | ORAL | Status: AC
  Administered 2015-10-01: 2 via ORAL
  Filled 2015-10-01: qty 2

## 2015-10-01 MED ORDER — POLYETHYLENE GLYCOL 3350 17 G PO PACK: 17.0000 g | PACK | Freq: Every day | ORAL | Status: DC

## 2015-10-01 NOTE — ED Notes (Signed)
Patient presents to the ED with right upper quadrant tenderness and back pain.  Patient states he has had back pain for several days but noticed abdominal tenderness yesterday evening for the first time.  Patient denies nausea, vomiting, or diarrhea.  Area is very tender to the touch.  Patient denies pain when area is not palpated.  Patient is in no obvious distress at this time.

## 2015-10-01 NOTE — ED Notes (Signed)
NAD noted at time of D/C. Pt ambulatory to the lobby at this time. Pt refused wheelchair. Pt states his wife is here to pick him up. Denies comments/concerns at this time.

## 2015-10-01 NOTE — ED Provider Notes (Signed)
[facility] Emergency Department Provider Note     Time seen: ----------------------------------------- 4:45 PM on 10/01/2015 -----------------------------------------    I have reviewed the triage vital signs and the nursing notes.   HISTORY  Chief Complaint Abdominal Pain and Back Pain    HPI Dakota Thomas is a 82 y.o. male who presents to ER for right flank pain. Patient states hurts mostly when you press on the right side is also some right-sided low back pain. Patient states she's had back pain for several days but noticed abdominal tenderness on the right side of the first time. He denies fevers, chills, nausea, vomiting or diarrhea. Patient states the pain is sharp, he's had a history of kidney stones.   Past Medical History  Diagnosis Date  . Diabetes mellitus without complication ([place])   . Hypertension   . Heart attack ([place])   . Pacemaker   . Renal disorder     There are no active problems to display for this patient.   Past Surgical History  Procedure Laterality Date  . Cholecystectomy    . Pacemaker insertion    . Tonsillectomy      Allergies Ciprofloxacin  Social History Social History  Substance Use Topics  . Smoking status: Former Research scientist (life sciences)  . Smokeless tobacco: None  . Alcohol Use: Yes    Review of Systems Constitutional: Negative for fever. Eyes: Negative for visual changes. ENT: Negative for sore throat. Cardiovascular: Negative for chest pain. Respiratory: Negative for shortness of breath. Gastrointestinal: Positive for right flank pain Genitourinary: Negative for dysuria. Musculoskeletal: Negative for back pain. Skin: Negative for rash. Neurological: Negative for headaches, focal weakness or numbness.  10-point ROS otherwise negative.  ____________________________________________   PHYSICAL EXAM:  VITAL SIGNS: ED Triage Vitals  Enc Vitals Group     BP 10/01/15 1506 161/88 mmHg     Pulse Rate 10/01/15  1506 69     Resp 10/01/15 1506 20     Temp 10/01/15 1506 97.7 F (36.5 C)     Temp Source 10/01/15 1506 Oral     SpO2 10/01/15 1506 100 %     Weight 10/01/15 1506 210 lb (95.255 kg)     Height 10/01/15 1506 6\' 1"  (1.854 m)     Head Cir --      Peak Flow --      Pain Score 10/01/15 1507 2     Pain Loc --      Pain Edu? --      Excl. in [place]? --     Constitutional: Alert and oriented. Well appearing and in no distress. Eyes: Conjunctivae are normal. PERRL. Normal extraocular movements. ENT   Head: Normocephalic and atraumatic.   Nose: No congestion/rhinnorhea.   Mouth/Throat: Mucous membranes are moist.   Neck: No stridor. Cardiovascular: Normal rate, regular rhythm. Normal and symmetric distal pulses are present in all extremities. No murmurs, rubs, or gallops. Respiratory: Normal respiratory effort without tachypnea nor retractions. Breath sounds are clear and equal bilaterally. No wheezes/rales/rhonchi. Gastrointestinal: Right flank tenderness, no rebound or guarding. Normal bowel sounds. Musculoskeletal: Nontender with normal range of motion in all extremities. No joint effusions.  No lower extremity tenderness nor edema. Neurologic:  Normal speech and language. No gross focal neurologic deficits are appreciated.  Skin:  Skin is warm, dry and intact. No rash noted. Psychiatric: Mood and affect are normal. Speech and behavior are normal. Patient exhibits appropriate insight and judgment. ____________________________________________  EKG: Interpreted by me. Normal sinus rhythm with  a rate of 67 bpm, normal PR interval, normal QRS, normal QT interval. Normal axis, possible anterior infarct.  ____________________________________________  ED COURSE:  Pertinent labs & imaging results that were available during my care of the patient were reviewed by me and considered in my medical decision making (see chart for details). Patient is in no acute distress, will check  abdominal labs and likely imaging the right side. ____________________________________________    LABS (pertinent positives/negatives)  Labs Reviewed  COMPREHENSIVE METABOLIC PANEL - Abnormal; Notable for the following:    Glucose, Bld 133 (*)    Anion gap 4 (*)    All other components within normal limits  CBC - Abnormal; Notable for the following:    RDW 15.2 (*)    Platelets 142 (*)    All other components within normal limits  URINALYSIS COMPLETEWITH MICROSCOPIC ([facility] ONLY) - Abnormal; Notable for the following:    Color, Urine YELLOW (*)    APPearance CLEAR (*)    Glucose, UA 50 (*)    Squamous Epithelial / LPF 0-5 (*)    All other components within normal limits  LIPASE, BLOOD    RADIOLOGY Images were viewed by me  CT renal protocol IMPRESSION: 1. Significant stool burden, possibly accounting for the patient's right lower quadrant pain. Stool is identified throughout the colon and within the distal normal appearing small bowel loops. 2. No evidence for intrarenal or ureteral stones. 3. Status post appendectomy. 4. Status post cholecystectomy. 5. Cardiomegaly and coronary artery disease. 6. Small left renal cyst. 7. Remote granulomatous disease; granulomata within the spleen. 8. Atherosclerotic disease of the abdominal aorta. 9. Lumbar spondylosis. ____________________________________________  FINAL ASSESSMENT AND PLAN  Flank pain  Plan: Patient with labs and imaging as dictated above. Pain is likely either musculoskeletal or from constipation or combination of both. He'll be discharged with laxatives, muscle relaxants and is encouraged to have close follow-up with his doctor for recheck.   Earleen Newport, MD   Earleen Newport, MD 10/01/15 [phone]

## 2015-10-01 NOTE — Discharge Instructions (Signed)
Flank Pain °Flank pain refers to pain that is located on the side of the body between the upper abdomen and the back. The pain may occur over a short period of time (acute) or may be long-term or reoccurring (chronic). It may be mild or severe. Flank pain can be caused by many things. °CAUSES  °Some of the more common causes of flank pain include: °· Muscle strains.   °· Muscle spasms.   °· A disease of your spine (vertebral disk disease).   °· A lung infection (pneumonia).   °· Fluid around your lungs (pulmonary edema).   °· A kidney infection.   °· Kidney stones.   °· A very painful skin rash caused by the chickenpox virus (shingles).   °· Gallbladder disease.   °HOME CARE INSTRUCTIONS  °Home care will depend on the cause of your pain. In general, °· Rest as directed by your caregiver. °· Drink enough fluids to keep your urine clear or pale yellow. °· Only take over-the-counter or prescription medicines as directed by your caregiver. Some medicines may help relieve the pain. °· Tell your caregiver about any changes in your pain. °· Follow up with your caregiver as directed. °SEEK IMMEDIATE MEDICAL CARE IF:  °· Your pain is not controlled with medicine.   °· You have new or worsening symptoms. °· Your pain increases.   °· You have abdominal pain.   °· You have shortness of breath.   °· You have persistent nausea or vomiting.   °· You have swelling in your abdomen.   °· You feel faint or pass out.   °· You have blood in your urine. °· You have a fever or persistent symptoms for more than 2-3 days. °· You have a fever and your symptoms suddenly get worse. °MAKE SURE YOU:  °· Understand these instructions. °· Will watch your condition. °· Will get help right away if you are not doing well or get worse. °  °This information is not intended to replace advice given to you by your health care provider. Make sure you discuss any questions you have with your health care provider. °  °Document Released: 08/23/2005 Document  Revised: 03/26/2012 Document Reviewed: 02/14/2012 °Elsevier Interactive Patient Education ©2016 Elsevier Inc. ° °

## 2015-10-03 DIAGNOSIS — E119 Type 2 diabetes mellitus without complications: Secondary | ICD-10-CM | POA: Diagnosis not present

## 2015-10-03 DIAGNOSIS — Z794 Long term (current) use of insulin: Secondary | ICD-10-CM | POA: Diagnosis not present

## 2015-10-03 DIAGNOSIS — I472 Ventricular tachycardia: Secondary | ICD-10-CM | POA: Diagnosis not present

## 2015-10-03 DIAGNOSIS — I25810 Atherosclerosis of coronary artery bypass graft(s) without angina pectoris: Secondary | ICD-10-CM | POA: Diagnosis not present

## 2015-10-03 DIAGNOSIS — R0781 Pleurodynia: Secondary | ICD-10-CM | POA: Diagnosis not present

## 2015-10-03 DIAGNOSIS — I10 Essential (primary) hypertension: Secondary | ICD-10-CM | POA: Diagnosis not present

## 2015-10-03 DIAGNOSIS — R0789 Other chest pain: Secondary | ICD-10-CM | POA: Diagnosis not present

## 2015-10-11 DIAGNOSIS — N200 Calculus of kidney: Secondary | ICD-10-CM | POA: Diagnosis not present

## 2015-10-11 DIAGNOSIS — E119 Type 2 diabetes mellitus without complications: Secondary | ICD-10-CM | POA: Diagnosis not present

## 2015-10-11 DIAGNOSIS — E782 Mixed hyperlipidemia: Secondary | ICD-10-CM | POA: Diagnosis not present

## 2015-10-11 DIAGNOSIS — Z794 Long term (current) use of insulin: Secondary | ICD-10-CM | POA: Diagnosis not present

## 2015-10-11 DIAGNOSIS — I480 Paroxysmal atrial fibrillation: Secondary | ICD-10-CM | POA: Diagnosis not present

## 2015-10-11 DIAGNOSIS — J309 Allergic rhinitis, unspecified: Secondary | ICD-10-CM | POA: Diagnosis not present

## 2015-10-11 DIAGNOSIS — I10 Essential (primary) hypertension: Secondary | ICD-10-CM | POA: Diagnosis not present

## 2015-10-11 DIAGNOSIS — I25810 Atherosclerosis of coronary artery bypass graft(s) without angina pectoris: Secondary | ICD-10-CM | POA: Diagnosis not present

## 2015-10-13 DIAGNOSIS — I472 Ventricular tachycardia: Secondary | ICD-10-CM | POA: Diagnosis not present

## 2015-10-18 DIAGNOSIS — Z0000 Encounter for general adult medical examination without abnormal findings: Secondary | ICD-10-CM | POA: Diagnosis not present

## 2015-10-18 DIAGNOSIS — E039 Hypothyroidism, unspecified: Secondary | ICD-10-CM | POA: Diagnosis not present

## 2015-10-18 DIAGNOSIS — I25810 Atherosclerosis of coronary artery bypass graft(s) without angina pectoris: Secondary | ICD-10-CM | POA: Diagnosis not present

## 2015-10-18 DIAGNOSIS — E1140 Type 2 diabetes mellitus with diabetic neuropathy, unspecified: Secondary | ICD-10-CM | POA: Diagnosis not present

## 2015-10-18 DIAGNOSIS — R251 Tremor, unspecified: Secondary | ICD-10-CM | POA: Diagnosis not present

## 2015-10-18 DIAGNOSIS — I255 Ischemic cardiomyopathy: Secondary | ICD-10-CM | POA: Diagnosis not present

## 2015-10-18 DIAGNOSIS — Z794 Long term (current) use of insulin: Secondary | ICD-10-CM | POA: Diagnosis not present

## 2015-10-18 DIAGNOSIS — I480 Paroxysmal atrial fibrillation: Secondary | ICD-10-CM | POA: Diagnosis not present

## 2015-10-18 DIAGNOSIS — I10 Essential (primary) hypertension: Secondary | ICD-10-CM | POA: Diagnosis not present

## 2015-11-08 DIAGNOSIS — I480 Paroxysmal atrial fibrillation: Secondary | ICD-10-CM | POA: Diagnosis not present

## 2015-11-22 ENCOUNTER — Observation Stay
Admission: EM | Admit: 2015-11-22 | Discharge: 2015-11-23 | Disposition: A | Discharge status: Home / Self Care | Payer: Commercial Managed Care - HMO | Attending: Internal Medicine | Admitting: Internal Medicine

## 2015-11-22 DIAGNOSIS — R55 Syncope and collapse: Principal | ICD-10-CM | POA: Diagnosis present

## 2015-11-22 DIAGNOSIS — Z86010 Personal history of colonic polyps: Secondary | ICD-10-CM | POA: Diagnosis not present

## 2015-11-22 DIAGNOSIS — I10 Essential (primary) hypertension: Secondary | ICD-10-CM | POA: Diagnosis present

## 2015-11-22 DIAGNOSIS — I482 Chronic atrial fibrillation: Secondary | ICD-10-CM | POA: Diagnosis not present

## 2015-11-22 DIAGNOSIS — R031 Nonspecific low blood-pressure reading: Secondary | ICD-10-CM | POA: Diagnosis not present

## 2015-11-22 DIAGNOSIS — Z950 Presence of cardiac pacemaker: Secondary | ICD-10-CM | POA: Insufficient documentation

## 2015-11-22 DIAGNOSIS — R509 Fever, unspecified: Secondary | ICD-10-CM | POA: Insufficient documentation

## 2015-11-22 DIAGNOSIS — Z87891 Personal history of nicotine dependence: Secondary | ICD-10-CM | POA: Insufficient documentation

## 2015-11-22 DIAGNOSIS — Z794 Long term (current) use of insulin: Secondary | ICD-10-CM | POA: Insufficient documentation

## 2015-11-22 DIAGNOSIS — Z951 Presence of aortocoronary bypass graft: Secondary | ICD-10-CM | POA: Insufficient documentation

## 2015-11-22 DIAGNOSIS — R2681 Unsteadiness on feet: Secondary | ICD-10-CM | POA: Diagnosis not present

## 2015-11-22 DIAGNOSIS — Z79899 Other long term (current) drug therapy: Secondary | ICD-10-CM | POA: Diagnosis not present

## 2015-11-22 DIAGNOSIS — E119 Type 2 diabetes mellitus without complications: Secondary | ICD-10-CM | POA: Diagnosis not present

## 2015-11-22 DIAGNOSIS — I255 Ischemic cardiomyopathy: Secondary | ICD-10-CM | POA: Diagnosis not present

## 2015-11-22 DIAGNOSIS — Z833 Family history of diabetes mellitus: Secondary | ICD-10-CM | POA: Insufficient documentation

## 2015-11-22 DIAGNOSIS — I081 Rheumatic disorders of both mitral and tricuspid valves: Secondary | ICD-10-CM | POA: Insufficient documentation

## 2015-11-22 DIAGNOSIS — I951 Orthostatic hypotension: Secondary | ICD-10-CM | POA: Insufficient documentation

## 2015-11-22 DIAGNOSIS — N400 Enlarged prostate without lower urinary tract symptoms: Secondary | ICD-10-CM | POA: Insufficient documentation

## 2015-11-22 DIAGNOSIS — I2510 Atherosclerotic heart disease of native coronary artery without angina pectoris: Secondary | ICD-10-CM | POA: Diagnosis present

## 2015-11-22 DIAGNOSIS — Z881 Allergy status to other antibiotic agents status: Secondary | ICD-10-CM | POA: Insufficient documentation

## 2015-11-22 DIAGNOSIS — Z7901 Long term (current) use of anticoagulants: Secondary | ICD-10-CM | POA: Diagnosis not present

## 2015-11-22 DIAGNOSIS — Z8249 Family history of ischemic heart disease and other diseases of the circulatory system: Secondary | ICD-10-CM | POA: Diagnosis not present

## 2015-11-22 DIAGNOSIS — I252 Old myocardial infarction: Secondary | ICD-10-CM | POA: Diagnosis not present

## 2015-11-22 DIAGNOSIS — Z955 Presence of coronary angioplasty implant and graft: Secondary | ICD-10-CM | POA: Diagnosis not present

## 2015-11-22 DIAGNOSIS — N289 Disorder of kidney and ureter, unspecified: Secondary | ICD-10-CM | POA: Diagnosis not present

## 2015-11-22 DIAGNOSIS — E039 Hypothyroidism, unspecified: Secondary | ICD-10-CM | POA: Insufficient documentation

## 2015-11-22 DIAGNOSIS — I4891 Unspecified atrial fibrillation: Secondary | ICD-10-CM | POA: Diagnosis present

## 2015-11-22 HISTORY — DX: Unspecified atrial fibrillation: I48.91

## 2015-11-22 HISTORY — DX: Benign prostatic hyperplasia without lower urinary tract symptoms: N40.0

## 2015-11-22 HISTORY — DX: Atherosclerotic heart disease of native coronary artery without angina pectoris: I25.10

## 2015-11-22 LAB — CBC WITH DIFFERENTIAL/PLATELET
Basophils Absolute: 0.1 10*3/uL (ref 0–0.1)
Basophils Relative: 0 %
Eosinophils Absolute: 0 10*3/uL (ref 0–0.7)
Eosinophils Relative: 0 %
HCT: 41.4 % (ref 40.0–52.0)
Hemoglobin: 13.8 g/dL (ref 13.0–18.0)
Lymphocytes Relative: 11 %
Lymphs Abs: 1.8 10*3/uL (ref 1.0–3.6)
MCH: 30.9 pg (ref 26.0–34.0)
MCHC: 33.3 g/dL (ref 32.0–36.0)
MCV: 92.8 fL (ref 80.0–100.0)
Monocytes Absolute: 2 10*3/uL — ABNORMAL HIGH (ref 0.2–1.0)
Monocytes Relative: 12 %
Neutro Abs: 12.5 10*3/uL — ABNORMAL HIGH (ref 1.4–6.5)
Neutrophils Relative %: 77 %
Platelets: 120 10*3/uL — ABNORMAL LOW (ref 150–440)
RBC: 4.47 MIL/uL (ref 4.40–5.90)
RDW: 14.6 % — ABNORMAL HIGH (ref 11.5–14.5)
WBC: 16.3 10*3/uL — ABNORMAL HIGH (ref 3.8–10.6)

## 2015-11-22 LAB — COMPREHENSIVE METABOLIC PANEL
ALT: 14 U/L — ABNORMAL LOW (ref 17–63)
AST: 25 U/L (ref 15–41)
Albumin: 3.8 g/dL (ref 3.5–5.0)
Alkaline Phosphatase: 55 U/L (ref 38–126)
Anion gap: 8 (ref 5–15)
BUN: 17 mg/dL (ref 6–20)
CO2: 26 mmol/L (ref 22–32)
Calcium: 8.8 mg/dL — ABNORMAL LOW (ref 8.9–10.3)
Chloride: 100 mmol/L — ABNORMAL LOW (ref 101–111)
Creatinine, Ser: 1.18 mg/dL (ref 0.61–1.24)
GFR calc Af Amer: >60 mL/min (ref >60)
GFR calc non Af Amer: 57 mL/min — ABNORMAL LOW (ref >60)
Glucose, Bld: 111 mg/dL — ABNORMAL HIGH (ref 65–99)
Potassium: 4.2 mmol/L (ref 3.5–5.1)
Sodium: 134 mmol/L — ABNORMAL LOW (ref 135–145)
Total Bilirubin: 1.9 mg/dL — ABNORMAL HIGH (ref 0.3–1.2)
Total Protein: 7.2 g/dL (ref 6.5–8.1)

## 2015-11-22 LAB — CK: Total CK: 226 U/L (ref 49–397)

## 2015-11-22 LAB — URINALYSIS COMPLETE WITH MICROSCOPIC (ARMC ONLY)
Bilirubin Urine: NEGATIVE
Glucose, UA: NEGATIVE mg/dL
Leukocytes, UA: NEGATIVE
Nitrite: NEGATIVE
Protein, ur: NEGATIVE mg/dL
Specific Gravity, Urine: 1.016 (ref 1.005–1.030)
pH: 5 (ref 5.0–8.0)

## 2015-11-22 LAB — MAGNESIUM: Magnesium: 1.8 mg/dL (ref 1.7–2.4)

## 2015-11-22 LAB — TROPONIN I: Troponin I: 0.03 ng/mL (ref <0.031)

## 2015-11-22 LAB — LIPASE, BLOOD: Lipase: 17 U/L (ref 11–51)

## 2015-11-22 MED ORDER — METOPROLOL SUCCINATE ER 25 MG PO TB24
25.0000 mg | ORAL_TABLET | Freq: Every day | ORAL | Status: DC
  Administered 2015-11-23: 25 mg via ORAL
  Filled 2015-11-22: qty 1

## 2015-11-22 MED ORDER — ACETAMINOPHEN 325 MG PO TABS
650.0000 mg | ORAL_TABLET | Freq: Four times a day (QID) | ORAL | Status: DC | PRN
  Administered 2015-11-22: 650 mg via ORAL
  Filled 2015-11-22: qty 2

## 2015-11-22 MED ORDER — ONDANSETRON HCL 4 MG PO TABS: 4.0000 mg | ORAL_TABLET | Freq: Four times a day (QID) | ORAL | Status: DC | PRN

## 2015-11-22 MED ORDER — SODIUM CHLORIDE 0.9% FLUSH
3.0000 mL | Freq: Two times a day (BID) | INTRAVENOUS | Status: DC
  Administered 2015-11-22: 3 mL via INTRAVENOUS

## 2015-11-22 MED ORDER — ACETAMINOPHEN 650 MG RE SUPP: 650.0000 mg | Freq: Four times a day (QID) | RECTAL | Status: DC | PRN

## 2015-11-22 MED ORDER — HYDROCODONE-ACETAMINOPHEN 5-325 MG PO TABS
1.0000 | ORAL_TABLET | ORAL | Status: DC | PRN
  Administered 2015-11-23: 1 via ORAL
  Filled 2015-11-22: qty 1

## 2015-11-22 MED ORDER — SODIUM CHLORIDE 0.9 % IV BOLUS (SEPSIS)
500.0000 mL | INTRAVENOUS | Status: AC
  Administered 2015-11-22: 500 mL via INTRAVENOUS

## 2015-11-22 MED ORDER — LEVOTHYROXINE SODIUM 100 MCG PO TABS
100.0000 ug | ORAL_TABLET | Freq: Every day | ORAL | Status: DC
  Administered 2015-11-23: 100 ug via ORAL
  Filled 2015-11-22: qty 1

## 2015-11-22 MED ORDER — ALPRAZOLAM 0.25 MG PO TABS
0.5000 mg | ORAL_TABLET | Freq: Three times a day (TID) | ORAL | Status: DC | PRN
  Administered 2015-11-23: 0.5 mg via ORAL
  Filled 2015-11-22: qty 2

## 2015-11-22 MED ORDER — ATORVASTATIN CALCIUM 20 MG PO TABS
40.0000 mg | ORAL_TABLET | Freq: Every day | ORAL | Status: DC
  Filled 2015-11-22: qty 2

## 2015-11-22 MED ORDER — ONDANSETRON HCL 4 MG/2ML IJ SOLN: 4.0000 mg | Freq: Four times a day (QID) | INTRAMUSCULAR | Status: DC | PRN

## 2015-11-22 MED ORDER — AMIODARONE HCL 200 MG PO TABS
100.0000 mg | ORAL_TABLET | Freq: Every day | ORAL | Status: DC
  Administered 2015-11-23: 100 mg via ORAL
  Filled 2015-11-22: qty 1

## 2015-11-22 MED ORDER — INSULIN ASPART 100 UNIT/ML ~~LOC~~ SOLN: 0.0000 [IU] | Freq: Every day | SUBCUTANEOUS | Status: DC

## 2015-11-22 MED ORDER — INSULIN ASPART 100 UNIT/ML ~~LOC~~ SOLN
0.0000 [IU] | Freq: Three times a day (TID) | SUBCUTANEOUS | Status: DC
  Administered 2015-11-23: 2 [IU] via SUBCUTANEOUS
  Administered 2015-11-23: 1 [IU] via SUBCUTANEOUS
  Filled 2015-11-22: qty 1
  Filled 2015-11-22: qty 2

## 2015-11-22 MED ORDER — SODIUM CHLORIDE 0.9 % IV SOLN
INTRAVENOUS | Status: AC
  Administered 2015-11-22: 23:00:00 via INTRAVENOUS

## 2015-11-22 MED ORDER — PANTOPRAZOLE SODIUM 40 MG PO TBEC
40.0000 mg | DELAYED_RELEASE_TABLET | Freq: Every day | ORAL | Status: DC
Start: 2015-11-23 — End: 2015-11-23
  Administered 2015-11-23: 40 mg via ORAL
  Filled 2015-11-22: qty 1

## 2015-11-22 NOTE — ED Provider Notes (Signed)
[facility] Emergency Department Provider Note  ____________________________________________  Time seen: Approximately 5:05 PM  I have reviewed the triage vital signs and the nursing notes.   HISTORY  Chief Complaint Near Syncope    HPI Dakota Thomas is a 82 y.o. male who arrives by EMS for evaluation of near syncope.  Reportedly he played 18 holes of golf yesterday and between yesterday and today he has had as many as 10 episodes of nearly passing out when he stands up.  He denies chest pain, shortness of breath, fever/chills, abdominal pain, nausea, vomiting, diarrhea.  He states that he may not have had enough fluids since playing golf.  He has had no dysuria and no hematuria.  He has an extensive cardiac history that includes a triple bypass, 5 stents, a chill fibrillation, and reportedly an episode of ventricular tachycardia, but these were minimal years ago.  Dr. Nehemiah Massed is currently his cardiologist.  The episodes of near syncope are severe in intensity but he never completely loses consciousness and they are brief.  He feels better after lying flat.  They are not accompanied with any other symptoms.  They are acute in onset.  Past Medical History  Diagnosis Date  . Diabetes mellitus without complication ([place])   . Hypertension   . Heart attack ([place])   . Pacemaker   . Renal disorder   . CAD (coronary artery disease)   . A-fib ([place])   . BPH (benign prostatic hyperplasia)     Patient Active Problem List   Diagnosis Date Noted  . Near syncope 11/22/2015  . Diabetes ([place]) 11/22/2015  . CAD (coronary artery disease) 11/22/2015  . HTN (hypertension) 11/22/2015  . A-fib ([place]) 11/22/2015  . BPH (benign prostatic hyperplasia) 11/22/2015    Past Surgical History  Procedure Laterality Date  . Cholecystectomy    . Pacemaker insertion    . Tonsillectomy    . Coronary artery bypass graft    . Coronary angioplasty with stent placement    .  Appendectomy      Current Outpatient Rx  Name  Route  Sig  Dispense  Refill  . ALPRAZolam (XANAX) 0.5 MG tablet   Oral   Take 0.5 mg by mouth 3 (three) times daily as needed for anxiety.         Marland Kitchen amiodarone (PACERONE) 200 MG tablet   Oral   Take 100 mg by mouth daily.         Marland Kitchen atorvastatin (LIPITOR) 40 MG tablet   Oral   Take 40 mg by mouth daily.         Marland Kitchen levothyroxine (SYNTHROID, LEVOTHROID) 100 MCG tablet   Oral   Take 100 mcg by mouth daily before breakfast.         . metoprolol succinate (TOPROL-XL) 25 MG 24 hr tablet   Oral   Take 25 mg by mouth daily.         Marland Kitchen omeprazole (PRILOSEC) 20 MG capsule   Oral   Take 20 mg by mouth daily.         Marland Kitchen spironolactone (ALDACTONE) 25 MG tablet   Oral   Take 25 mg by mouth daily.         Marland Kitchen albuterol (PROVENTIL HFA) 108 (90 BASE) MCG/ACT inhaler   Inhalation   Inhale 2 puffs into the lungs every 4 (four) hours as needed for wheezing or shortness of breath.   1 Inhaler   0   . diazepam (  VALIUM) 5 MG tablet   Oral   Take 1 tablet (5 mg total) by mouth every 8 (eight) hours as needed for muscle spasms.   20 tablet   0   . ibuprofen (ADVIL,MOTRIN) 600 MG tablet   Oral   Take 1 tablet (600 mg total) by mouth every 8 (eight) hours as needed.   30 tablet   0   . ondansetron (ZOFRAN ODT) 8 MG disintegrating tablet   Oral   Take 1 tablet (8 mg total) by mouth every 8 (eight) hours as needed for nausea or vomiting.   20 tablet   0   . polyethylene glycol (MIRALAX / GLYCOLAX) packet   Oral   Take 17 g by mouth daily.   14 each   0   . ranitidine (ZANTAC) 150 MG capsule   Oral   Take 1 capsule (150 mg total) by mouth 2 (two) times daily.   28 capsule   0     Allergies Ciprofloxacin  Family History  Problem Relation Age of Onset  . CAD    . Diabetes    . Hypertension      Social History Social History  Substance Use Topics  . Smoking status: Former Research scientist (life sciences)  . Smokeless tobacco: None  .  Alcohol Use: Yes    Review of Systems Constitutional: No fever/chills Eyes: No visual changes. ENT: No sore throat. Cardiovascular: Denies chest pain.  Multiple near syncopal episodes since yesterday. Respiratory: Denies shortness of breath. Gastrointestinal: No abdominal pain.  No nausea, no vomiting.  No diarrhea.  No constipation. Genitourinary: Negative for dysuria. Musculoskeletal: Negative for back pain. Skin: Negative for rash. Neurological: Negative for headaches, focal weakness or numbness.    10-point ROS otherwise negative.  ____________________________________________   PHYSICAL EXAM:  VITAL SIGNS: ED Triage Vitals  Enc Vitals Group     BP --      Pulse --      Resp --      Temp --      Temp src --      SpO2 --      Weight --      Height --      Head Cir --      Peak Flow --      Pain Score --      Pain Loc --      Pain Edu? --      Excl. in [place]? --     Constitutional: Alert and oriented. Well appearing and in no acute distress. Eyes: Conjunctivae are normal. PERRL. EOMI. Head: Atraumatic. Nose: No congestion/rhinnorhea. Mouth/Throat: Mucous membranes are moist.  Oropharynx non-erythematous. Neck: No stridor.  No meningeal signs.   Cardiovascular: Normal rate, regular rhythm. Good peripheral circulation. Grossly normal heart sounds.   Respiratory: Normal respiratory effort.  No retractions. Lungs CTAB. Gastrointestinal: Soft and nontender. No distention.  Musculoskeletal: No lower extremity tenderness nor edema. No gross deformities of extremities. Neurologic:  Normal speech and language. No gross focal neurologic deficits are appreciated.  Skin:  Skin is warm, dry and intact. No rash noted. Psychiatric: Mood and affect are normal. Speech and behavior are normal.  ____________________________________________   LABS (all labs ordered are listed, but only abnormal results are displayed)  Labs Reviewed  CBC WITH DIFFERENTIAL/PLATELET - Abnormal;  Notable for the following:    WBC 16.3 (*)    RDW 14.6 (*)    Platelets 120 (*)    Neutro Abs 12.5 (*)  Monocytes Absolute 2.0 (*)    All other components within normal limits  URINALYSIS COMPLETEWITH MICROSCOPIC ([facility] ONLY) - Abnormal; Notable for the following:    Color, Urine YELLOW (*)    APPearance CLEAR (*)    Ketones, ur TRACE (*)    Hgb urine dipstick 1+ (*)    Bacteria, UA RARE (*)    Squamous Epithelial / LPF 0-5 (*)    All other components within normal limits  COMPREHENSIVE METABOLIC PANEL - Abnormal; Notable for the following:    Sodium 134 (*)    Chloride 100 (*)    Glucose, Bld 111 (*)    Calcium 8.8 (*)    ALT 14 (*)    Total Bilirubin 1.9 (*)    GFR calc non Af Amer 57 (*)    All other components within normal limits  CK  LIPASE, BLOOD  MAGNESIUM  TROPONIN I   ____________________________________________  EKG  ED ECG REPORT I, Maraya Gwilliam, the attending physician, personally viewed and interpreted this ECG.  Date: 11/22/2015 EKG Time: 17:10 Rate: 88 Rhythm: normal sinus rhythm QRS Axis: normal Intervals: normal ST/T Wave abnormalities: normal Conduction Disturbances: none Narrative Interpretation: unremarkable  ____________________________________________  RADIOLOGY   No results found.  ____________________________________________   PROCEDURES  Procedure(s) performed: None  Critical Care performed: No ____________________________________________   INITIAL IMPRESSION / ASSESSMENT AND PLAN / ED COURSE  Pertinent labs & imaging results that were available during my care of the patient were reviewed by me and considered in my medical decision making (see chart for details).  Likely volume depletion leading to orthostatic hypotension and near syncope.  Received 250 mL NS by EMS PTA, but will check orthostatics as well as basic labs, electrolytes, CK, UA, ECG.  Patient in NAD, joking with me, alert and  oriented.  ----------------------------------------- 9:10 PM on 11/22/2015 -----------------------------------------  The patient's labs had to be sent 3 times before the results came back due to hemolysis and contamination with IV fluid.  The lab results now are reassuring.  However I just went in and the patient stood up and then immediately started to fall back over.  The issue again seems to be a pressure issue and not an issue of central vertigo, for example.  I asked staff to repeat the orthostatics and to try ambulating the patient with assistance on either side of him.  ----------------------------------------- 9:58 PM on 11/22/2015 -----------------------------------------  The patient was more orthostatic after 1 L of fluids than he was previously.  He continues to have no chest pain or shortness of breath.  His lungs remain clear.  However I am afraid that he is suffering from more of an autonomic or cardiac cause of orthostasis.  He also lost bladder control when he stood up.  Discussed with patient and family, then with hospitliast - they will admit. ____________________________________________  FINAL CLINICAL IMPRESSION(S) / ED DIAGNOSES  Final diagnoses:  Near syncope  Orthostatic hypotension     MEDICATIONS GIVEN DURING THIS VISIT:  Medications  sodium chloride 0.9 % bolus 500 mL (0 mLs Intravenous Stopped 11/22/15 1946)  sodium chloride 0.9 % bolus 500 mL (0 mLs Intravenous Stopped 11/22/15 2100)     NEW OUTPATIENT MEDICATIONS STARTED DURING THIS VISIT:  New Prescriptions   No medications on file      Note:  This document was prepared using Dragon voice recognition software and may include unintentional dictation errors.   Hinda Kehr, MD 11/22/15 2201

## 2015-11-22 NOTE — ED Notes (Signed)
Patient continues to be orthostatic. Very unsteady. Patient voided on pants and underwear. Patient changed, bedding changed and warm blanket given.

## 2015-11-22 NOTE — H&P (Signed)
[place] at [place] NAME: Dakota Thomas    MR#:  [id number]  DATE OF BIRTH:  Nov 20, 1933  DATE OF ADMISSION:  11/22/2015  PRIMARY CARE PHYSICIAN: Tracie Harrier, MD   REQUESTING/REFERRING PHYSICIAN: Karma Greaser, MD  CHIEF COMPLAINT:   Chief Complaint  Patient presents with  . Near Syncope    HISTORY OF PRESENT ILLNESS:  Dakota Thomas  is a 82 y.o. male who presents with Orthostatic hypotension and subsequent near syncopal events. She states that for the past 2 days he's had 9-10 events. They'll occur when he goes from a sitting or lying position to standing, and he has a near syncope, and also loses control of his bladder.  He received significant IV fluids today, and despite this he is still very orthostatic, raising question of possible development of autonomic instability. Hospitals were called for admission for further evaluation  PAST MEDICAL HISTORY:   Past Medical History  Diagnosis Date  . Diabetes mellitus without complication ([place])   . Hypertension   . Heart attack ([place])   . Pacemaker   . Renal disorder   . CAD (coronary artery disease)   . A-fib ([place])   . BPH (benign prostatic hyperplasia)     PAST SURGICAL HISTORY:   Past Surgical History  Procedure Laterality Date  . Cholecystectomy    . Pacemaker insertion    . Tonsillectomy    . Coronary artery bypass graft    . Coronary angioplasty with stent placement    . Appendectomy      SOCIAL HISTORY:   Social History  Substance Use Topics  . Smoking status: Former Research scientist (life sciences)  . Smokeless tobacco: Not on file  . Alcohol Use: Yes    FAMILY HISTORY:   Family History  Problem Relation Age of Onset  . CAD    . Diabetes    . Hypertension      DRUG ALLERGIES:   Allergies  Allergen Reactions  . Ciprofloxacin     MEDICATIONS AT HOME:   Prior to Admission medications   Medication Sig Start Date End Date Taking? Authorizing Provider  ALPRAZolam Duanne Moron)  0.5 MG tablet Take 0.5 mg by mouth 3 (three) times daily as needed for anxiety.   Yes Historical Provider, MD  amiodarone (PACERONE) 200 MG tablet Take 100 mg by mouth daily.   Yes Historical Provider, MD  atorvastatin (LIPITOR) 40 MG tablet Take 40 mg by mouth daily.   Yes Historical Provider, MD  levothyroxine (SYNTHROID, LEVOTHROID) 100 MCG tablet Take 100 mcg by mouth daily before breakfast.   Yes Historical Provider, MD  metoprolol succinate (TOPROL-XL) 25 MG 24 hr tablet Take 25 mg by mouth daily.   Yes Historical Provider, MD  omeprazole (PRILOSEC) 20 MG capsule Take 20 mg by mouth daily.   Yes Historical Provider, MD  spironolactone (ALDACTONE) 25 MG tablet Take 25 mg by mouth daily.   Yes Historical Provider, MD  albuterol (PROVENTIL HFA) 108 (90 BASE) MCG/ACT inhaler Inhale 2 puffs into the lungs every 4 (four) hours as needed for wheezing or shortness of breath. 07/02/15   Carrie Mew, MD  diazepam (VALIUM) 5 MG tablet Take 1 tablet (5 mg total) by mouth every 8 (eight) hours as needed for muscle spasms. 10/01/15   Earleen Newport, MD  ibuprofen (ADVIL,MOTRIN) 600 MG tablet Take 1 tablet (600 mg total) by mouth every 8 (eight) hours as needed. 10/01/15   Earleen Newport, MD  ondansetron (ZOFRAN ODT) 8  MG disintegrating tablet Take 1 tablet (8 mg total) by mouth every 8 (eight) hours as needed for nausea or vomiting. 07/02/15   Carrie Mew, MD  polyethylene glycol [facility] / Floria Raveling) packet Take 17 g by mouth daily. 10/01/15   Earleen Newport, MD  ranitidine (ZANTAC) 150 MG capsule Take 1 capsule (150 mg total) by mouth 2 (two) times daily. 07/02/15   Carrie Mew, MD    REVIEW OF SYSTEMS:  Review of Systems  Constitutional: Negative for fever, chills, weight loss and malaise/fatigue.  HENT: Negative for ear pain, hearing loss and tinnitus.   Eyes: Negative for blurred vision, double vision, pain and redness.  Respiratory: Negative for cough, hemoptysis and  shortness of breath.   Cardiovascular: Negative for chest pain, palpitations, orthopnea and leg swelling.  Gastrointestinal: Negative for nausea, vomiting, abdominal pain, diarrhea and constipation.  Genitourinary: Negative for dysuria, frequency and hematuria.       Urinary incontinence associated with near syncopal events  Musculoskeletal: Negative for back pain, joint pain and neck pain.  Skin:       No acne, rash, or lesions  Neurological: Negative for dizziness, tremors, focal weakness and weakness.       Near syncope  Endo/Heme/Allergies: Negative for polydipsia. Does not bruise/bleed easily.  Psychiatric/Behavioral: Negative for depression. The patient is not nervous/anxious and does not have insomnia.      VITAL SIGNS:   Filed Vitals:   11/22/15 1845 11/22/15 1900 11/22/15 1915 11/22/15 1930  BP:  124/72  120/73  Pulse: 84 85 86 85  Temp:      TempSrc:      Resp: 25 23 20 17   Height:      Weight:      SpO2: 95% 98% 96% 97%   Wt Readings from Last 3 Encounters:  11/22/15 91.627 kg (202 lb)  10/01/15 95.255 kg (210 lb)  07/02/15 99.791 kg (220 lb)    PHYSICAL EXAMINATION:  Physical Exam  Vitals reviewed. Constitutional: He is oriented to person, place, and time. He appears well-developed and well-nourished. No distress.  HENT:  Head: Normocephalic and atraumatic.  Mouth/Throat: Oropharynx is clear and moist.  Eyes: Conjunctivae and EOM are normal. Pupils are equal, round, and reactive to light. No scleral icterus.  Neck: Normal range of motion. Neck supple. No JVD present. No thyromegaly present.  Cardiovascular: Normal rate and intact distal pulses.  Exam reveals no gallop and no friction rub.   No murmur heard. Irregular rhythm  Respiratory: Effort normal and breath sounds normal. No respiratory distress. He has no wheezes. He has no rales.  GI: Soft. Bowel sounds are normal. He exhibits no distension. There is no tenderness.  Musculoskeletal: Normal range of  motion. He exhibits no edema.  No arthritis, no gout  Lymphadenopathy:    He has no cervical adenopathy.  Neurological: He is alert and oriented to person, place, and time. No cranial nerve deficit.  No dysarthria, no aphasia  Skin: Skin is warm and dry. No rash noted. No erythema.  Psychiatric: He has a normal mood and affect. His behavior is normal. Judgment and thought content normal.    LABORATORY PANEL:   CBC  Recent Labs Lab 11/22/15 1715  WBC 16.3*  HGB 13.8  HCT 41.4  PLT 120*   ------------------------------------------------------------------------------------------------------------------  Chemistries   Recent Labs Lab 11/22/15 1922  NA 134*  K 4.2  CL 100*  CO2 26  GLUCOSE 111*  BUN 17  CREATININE 1.18  CALCIUM 8.8*  MG 1.8  AST 25  ALT 14*  ALKPHOS 55  BILITOT 1.9*   ------------------------------------------------------------------------------------------------------------------  Cardiac Enzymes  Recent Labs Lab 11/22/15 1922  TROPONINI 0.03   ------------------------------------------------------------------------------------------------------------------  RADIOLOGY:  No results found.  EKG:   Orders placed or performed during the hospital encounter of 11/22/15  . ED EKG  . ED EKG  . EKG 12-Lead  . EKG 12-Lead    IMPRESSION AND PLAN:  Principal Problem:   Near syncope - most certainly related to whatever is causing his orthostasis. We will admit him tonight, keep him on telemetry to monitor for arrhythmias, get an echocardiogram in morning, and a cardiology consult. We will also trend his cardiac enzymes tonight. Active Problems:   Diabetes ([place]) - sinus scale insulin with corresponding glucose checks and carb modified diet.   CAD (coronary artery disease) - continue home meds   HTN (hypertension) - currently stable, continue home meds except for any diuretics   A-fib ([place]) - continue home meds and anticoagulation  All the  records are reviewed and case discussed with ED provider. Management plans discussed with the patient and/or family.  DVT PROPHYLAXIS: Systemic anticoagulation  GI PROPHYLAXIS: PPI  ADMISSION STATUS: Observation  CODE STATUS: Full Code Status History    This patient does not have a recorded code status. Please follow your organizational policy for patients in this situation.    Advance Directive Documentation        Most Recent Value   Type of Advance Directive  Healthcare Power of Attorney, Living will   Pre-existing out of facility DNR order (yellow form or pink MOST form)     "MOST" Form in Place?        TOTAL TIME TAKING CARE OF THIS PATIENT: 45 minutes.    Solimar Maiden FIELDING 11/22/2015, 10:02 PM  [facility]  Office  [phone]  CC: Primary care physician; Tracie Harrier, MD

## 2015-11-22 NOTE — ED Notes (Signed)
Dr. Karma Greaser at bedside to discuss results with patient.

## 2015-11-22 NOTE — ED Notes (Signed)
Patient with multiple near syncopal episodes since last night.

## 2015-11-22 NOTE — ED Notes (Signed)
Patient is alert and oriented X 4. Is orthostatic.

## 2015-11-22 NOTE — ED Notes (Signed)
Labs redrawn from right [facility] using butterfly. Rainbow sent to lab.

## 2015-11-23 ENCOUNTER — Observation Stay
Admit: 2015-11-23 | Discharge: 2015-11-23 | Disposition: A | Discharge status: Home / Self Care | Payer: Commercial Managed Care - HMO | Attending: Internal Medicine | Admitting: Internal Medicine

## 2015-11-23 DIAGNOSIS — E119 Type 2 diabetes mellitus without complications: Secondary | ICD-10-CM | POA: Diagnosis not present

## 2015-11-23 DIAGNOSIS — I5022 Chronic systolic (congestive) heart failure: Secondary | ICD-10-CM | POA: Diagnosis not present

## 2015-11-23 DIAGNOSIS — R55 Syncope and collapse: Secondary | ICD-10-CM | POA: Diagnosis not present

## 2015-11-23 DIAGNOSIS — I10 Essential (primary) hypertension: Secondary | ICD-10-CM | POA: Diagnosis not present

## 2015-11-23 LAB — TROPONIN I
Troponin I: 0.04 ng/mL — ABNORMAL HIGH (ref <0.031)
Troponin I: 0.03 ng/mL (ref <0.031)
Troponin I: 0.03 ng/mL (ref <0.031)

## 2015-11-23 LAB — BASIC METABOLIC PANEL
Anion gap: 7 (ref 5–15)
BUN: 16 mg/dL (ref 6–20)
CO2: 27 mmol/L (ref 22–32)
Calcium: 8.7 mg/dL — ABNORMAL LOW (ref 8.9–10.3)
Chloride: 101 mmol/L (ref 101–111)
Creatinine, Ser: 1.06 mg/dL (ref 0.61–1.24)
GFR calc Af Amer: >60 mL/min (ref >60)
GFR calc non Af Amer: >60 mL/min (ref >60)
Glucose, Bld: 127 mg/dL — ABNORMAL HIGH (ref 65–99)
Potassium: 4.4 mmol/L (ref 3.5–5.1)
Sodium: 135 mmol/L (ref 135–145)

## 2015-11-23 LAB — CBC
HCT: 40.1 % (ref 40.0–52.0)
Hemoglobin: 13.4 g/dL (ref 13.0–18.0)
MCH: 30.8 pg (ref 26.0–34.0)
MCHC: 33.5 g/dL (ref 32.0–36.0)
MCV: 91.7 fL (ref 80.0–100.0)
Platelets: 108 10*3/uL — ABNORMAL LOW (ref 150–440)
RBC: 4.37 MIL/uL — ABNORMAL LOW (ref 4.40–5.90)
RDW: 14.6 % — ABNORMAL HIGH (ref 11.5–14.5)
WBC: 13.6 10*3/uL — ABNORMAL HIGH (ref 3.8–10.6)

## 2015-11-23 LAB — GLUCOSE, CAPILLARY
Glucose-Capillary: 131 mg/dL — ABNORMAL HIGH (ref 65–99)
Glucose-Capillary: 165 mg/dL — ABNORMAL HIGH (ref 65–99)

## 2015-11-23 LAB — ECHOCARDIOGRAM COMPLETE
Height: 73 in
Weight: 3219.2 oz

## 2015-11-23 LAB — HEMOGLOBIN A1C: Hgb A1c MFr Bld: 6.9 % — ABNORMAL HIGH (ref 4.0–6.0)

## 2015-11-23 MED ORDER — INSULIN GLARGINE 100 UNIT/ML ~~LOC~~ SOLN
10.0000 [IU] | Freq: Every morning | SUBCUTANEOUS | Status: DC
  Filled 2015-11-23: qty 0.1

## 2015-11-23 MED ORDER — PERFLUTREN LIPID MICROSPHERE
INTRAVENOUS | Status: AC
  Filled 2015-11-23: qty 10

## 2015-11-23 MED ORDER — GABAPENTIN 600 MG PO TABS: 600.0000 mg | ORAL_TABLET | Freq: Three times a day (TID) | ORAL | Status: DC

## 2015-11-23 MED ORDER — ENOXAPARIN SODIUM 40 MG/0.4ML ~~LOC~~ SOLN: 40.0000 mg | SUBCUTANEOUS | Status: DC

## 2015-11-23 NOTE — Evaluation (Addendum)
Physical Therapy Evaluation Patient Details Name: Dakota Thomas MRN: [id number] DOB: [birth year] Today's Date: 11/23/2015   History of Present Illness  Dakota Thomas is a 82 y.o. male who presents with Orthostatic hypotension and subsequent near syncopal events. She states that for the past 2 days he's had 9-10 events. They'll occur when he goes from a sitting or lying position to standing, and he has a near syncope, and also loses control of his bladder. He received significant IV fluids today, and despite this he is still very orthostatic, raising question of possible development of autonomic instability. Hospitals were called for admission for further evaluation  Clinical Impression  Pt demonstrates good speed and sequencing with bed mobility, transfers, and ambulation. He denies any dizziness or lightheadedness during all transfers and ambulation. He is able to ambulate a full lap around RN station without an assistive device. Horizontal and vertical head turns do not result in dizziness or instability. Pt reports that his mobility has returned to baseline. No further PT needs identified at this time. Pt is safe to discharge home with wife when medically appropriate. RN and care manager notified. Will sign off. Please reconsult if status changes.     Follow Up Recommendations No PT follow up    Equipment Recommendations  None recommended by PT    Recommendations for Other Services       Precautions / Restrictions Precautions Precautions: Fall Restrictions Weight Bearing Restrictions: No      Mobility  Bed Mobility Overal bed mobility: Independent             General bed mobility comments: Good speed/sequencing  Transfers Overall transfer level: Needs assistance Equipment used: None Transfers: Sit to/from Stand Sit to Stand: Supervision         General transfer comment: Pt demonstrates excellent speed, sequencing and stability with transfer. Safe hand placement. No  deficits identified.   Ambulation/Gait Ambulation/Gait assistance: Supervision;Min guard Ambulation Distance (Feet): 250 Feet Assistive device: None Gait Pattern/deviations: Step-through pattern   Gait velocity interpretation: at or above normal speed for age/gender General Gait Details: Ambulated first 54' with rolling walker and then discontinued. Pt demonstrates good speed and stability during ambulation. Able to perform vertical and horizontal head turns. Able to perform gait speed changes as well without instability.   Stairs            Wheelchair Mobility    Modified Rankin (Stroke Patients Only)       Balance Overall balance assessment: No apparent balance deficits (not formally assessed)                                           Pertinent Vitals/Pain Pain Assessment: No/denies pain    Home Living Family/patient expects to be discharged to:: Private residence Living Arrangements: Spouse/significant other Available Help at Discharge: Family Type of Home: House (Condo) Home Access: Level entry     Home Layout: One level Home Equipment: Shower seat (No cane or walker)      Prior Function Level of Independence: Independent         Comments: Pt drives. Plays golf regularly     Hand Dominance   Dominant Hand: Right    Extremity/Trunk Assessment   Upper Extremity Assessment: Overall WFL for tasks assessed           Lower Extremity Assessment: Overall WFL for tasks assessed  Communication   Communication: No difficulties  Cognition Arousal/Alertness: Awake/alert Behavior During Therapy: WFL for tasks assessed/performed Overall Cognitive Status: Within Functional Limits for tasks assessed                      General Comments      Exercises        Assessment/Plan    PT Assessment Patent does not need any further PT services  PT Diagnosis     PT Problem List    PT Treatment Interventions     PT  Goals (Current goals can be found in the Care Plan section)      Frequency     Barriers to discharge        Co-evaluation               End of Session Equipment Utilized During Treatment: Gait belt Activity Tolerance: Patient tolerated treatment well Patient left: in chair;with call bell/phone within reach;with chair alarm set Nurse Communication: Mobility status    Functional Assessment Tool Used: clinical judgement, gait speed Functional Limitation: Mobility: Walking and moving around Mobility: Walking and Moving Around Current Status [id number]): At least 1 percent but less than 20 percent impaired, limited or restricted Mobility: Walking and Moving Around Goal Status [phone]): At least 1 percent but less than 20 percent impaired, limited or restricted Mobility: Walking and Moving Around Discharge Status [id number]): At least 1 percent but less than 20 percent impaired, limited or restricted    Time: 1303-1330 PT Time Calculation (min) (ACUTE ONLY): 27 min   Charges:   PT Evaluation $PT Eval Low Complexity: 1 Procedure     PT G Codes:   PT G-Codes **NOT FOR INPATIENT CLASS** Functional Assessment Tool Used: clinical judgement, gait speed Functional Limitation: Mobility: Walking and moving around Mobility: Walking and Moving Around Current Status [id number]): At least 1 percent but less than 20 percent impaired, limited or restricted Mobility: Walking and Moving Around Goal Status [phone]): At least 1 percent but less than 20 percent impaired, limited or restricted Mobility: Walking and Moving Around Discharge Status [phone]): At least 1 percent but less than 20 percent impaired, limited or restricted   Phillips Grout PT, DPT   Dakota Thomas 11/23/2015, 2:39 PM

## 2015-11-23 NOTE — Progress Notes (Signed)
Per patient he takes 600mg  PO gabapentin three times a day and 60 units lantus subq every morning. Dr. Lavetta Nielsen notified. MD rounding now.

## 2015-11-23 NOTE — Care Management Obs Status (Addendum)
[place] NOTIFICATION   Patient Details  Name: Dakota Thomas MRN: [id number] Date of Birth: 1933/12/29   Medicare Observation Status Notification Given:  Yes, patient refused to sign. Placed copy on chart.     Jolly Mango, RN 11/23/2015, 1:15 PM

## 2015-11-23 NOTE — Progress Notes (Signed)
[place] at [place] NAME: Dakota Thomas    MRN#:  [id number]  DATE OF BIRTH:  1934/02/19  SUBJECTIVE:  Hospital Day: none Dakota Thomas is a 82 y.o. male presenting with Near Syncope .   Overnight events: Febrile overnight Interval Events: No complaints this morning has not yet attempted to get out of bed  REVIEW OF SYSTEMS:  CONSTITUTIONAL: No fever, fatigue or weakness.  EYES: No blurred or double vision.  EARS, NOSE, AND THROAT: No tinnitus or ear pain.  RESPIRATORY: No cough, shortness of breath, wheezing or hemoptysis.  CARDIOVASCULAR: No chest pain, orthopnea, edema.  GASTROINTESTINAL: No nausea, vomiting, diarrhea or abdominal pain.  GENITOURINARY: No dysuria, hematuria.  ENDOCRINE: No polyuria, nocturia,  HEMATOLOGY: No anemia, easy bruising or bleeding SKIN: No rash or lesion. MUSCULOSKELETAL: No joint pain or arthritis.   NEUROLOGIC: No tingling, numbness, weakness.  PSYCHIATRY: No anxiety or depression.   DRUG ALLERGIES:   Allergies  Allergen Reactions  . Ciprofloxacin     VITALS:  Blood pressure 124/62, pulse 80, temperature 98.5 F (36.9 C), temperature source Oral, resp. rate 20, height 6\' 1"  (1.854 m), weight 91.264 kg (201 lb 3.2 oz), SpO2 96 %.  PHYSICAL EXAMINATION:  VITAL SIGNS: Filed Vitals:   11/23/15 1009 11/23/15 1111  BP: 122/59 124/62  Pulse: 78 80  Temp:  98.5 F (36.9 C)  Resp:  20   GENERAL:81 y.o.male currently in no acute distress.  HEAD: Normocephalic, atraumatic.  EYES: Pupils equal, round, reactive to light. Extraocular muscles intact. No scleral icterus.  MOUTH: Moist mucosal membrane. Dentition intact. No abscess noted.  EAR, NOSE, THROAT: Clear without exudates. No external lesions.  NECK: Supple. No thyromegaly. No nodules. No JVD.  PULMONARY: Clear to ascultation, without wheeze rails or rhonci. No use of accessory muscles, Good respiratory effort. good air entry  bilaterally CHEST: Nontender to palpation.  CARDIOVASCULAR: S1 and S2. iRegular rate and rhythm. No murmurs, rubs, or gallops. No edema. Pedal pulses 2+ bilaterally.  GASTROINTESTINAL: Soft, nontender, nondistended. No masses. Positive bowel sounds. No hepatosplenomegaly.  MUSCULOSKELETAL: No swelling, clubbing, or edema. Range of motion full in all extremities.  NEUROLOGIC: Cranial nerves II through XII are intact. No gross focal neurological deficits. Sensation intact. Reflexes intact.  SKIN: No ulceration, lesions, rashes, or cyanosis. Skin warm and dry. Turgor intact.  PSYCHIATRIC: Mood, affect within normal limits. The patient is awake, alert and oriented x 3. Insight, judgment intact.      LABORATORY PANEL:   CBC  Recent Labs Lab 11/23/15 0513  WBC 13.6*  HGB 13.4  HCT 40.1  PLT 108*   ------------------------------------------------------------------------------------------------------------------  Chemistries   Recent Labs Lab 11/22/15 1922 11/23/15 0513  NA 134* 135  K 4.2 4.4  CL 100* 101  CO2 26 27  GLUCOSE 111* 127*  BUN 17 16  CREATININE 1.18 1.06  CALCIUM 8.8* 8.7*  MG 1.8  --   AST 25  --   ALT 14*  --   ALKPHOS 55  --   BILITOT 1.9*  --    ------------------------------------------------------------------------------------------------------------------  Cardiac Enzymes  Recent Labs Lab 11/23/15 1138  TROPONINI 0.03   ------------------------------------------------------------------------------------------------------------------  RADIOLOGY:  No results found.  EKG:   Orders placed or performed during the hospital encounter of 11/22/15  . ED EKG  . ED EKG  . EKG 12-Lead  . EKG 12-Lead    ASSESSMENT AND PLAN:   Dakota Thomas is a 82 y.o. male presenting with  Near Syncope . Admitted 11/22/2015 : Day #: none Pre-syncope/orthostasis: IV fluid hydration follow vital signs and symptoms 2. Fever: Check blood cultures urinalysis  unrevealing denies any new respiratory symptoms 3. Atrial fibrillation chronic amiodarone 4. Type 2 diabetes insulin requiring Lantus and sliding scale coverage 5. Hypothyroidism unspecified Synthroid   All the records are reviewed and case discussed with Care Management/Social Workerr. Management plans discussed with the patient, family and they are in agreement.  CODE STATUS: full TOTAL TIME TAKING CARE OF THIS PATIENT: 28 minutes.   POSSIBLE D/C IN 1DAYS, DEPENDING ON CLINICAL CONDITION.   Hower,  Karenann Cai.D on 11/23/2015 at 1:23 PM  Between 7am to 6pm - Pager - [phone]  After 6pm: House Pager: - [facility]  Office  [phone]  CC: Primary care physician; Tracie Harrier, MD

## 2015-11-23 NOTE — Consult Note (Signed)
[facility] Cardiology  CARDIOLOGY CONSULT NOTE  Patient ID: Dakota Thomas MRN: [id number] DOB/AGE: 1934-05-28 82 y.o.  Admit date: 11/22/2015 Referring Physician Hower Primary Physician [facility] Primary Cardiologist Nehemiah Massed Reason for Consultation near-syncope  HPI: 46 year old gentleman referred for evaluation of postural dizziness, orthostatic hypotension and near syncope. The patient has known coronary disease, status post prior MI, CABG, multiple coronary stents with known ischemic cardiomyopathy, chronic systolic congestive heart failure, status post ICD. The patient has had a history of intermittent low blood pressures, postural dizziness with orthostatic hypertension followed by Dr. Nehemiah Massed. Approximately 3 weeks ago metoprolol was decreased with minimal overall improvement. Despite symptoms of postural dizziness, the patient is active, plays golf twice a week, and does water aerobics. He was in his usual state of health yesterday, and he states that muscles gave way without loss of consciousness. She presented to [facility] emergency room where history of intravenous fluids, defer further evaluation. Patient has ruled out for myocardial infarction with negative troponin. Telemetry reveals sinus rhythm. 2-D echocardiogram was performed earlier today which revealed severely reduced left ventricular function, with estimated LV ejection fraction of 20%, with apical dyskinesis and anterior wall akinesis, consistent with prior cardiac imaging studies.  Review of systems complete and found to be negative unless listed above     Past Medical History  Diagnosis Date  . Diabetes mellitus without complication ([place])   . Hypertension   . Heart attack ([place])   . Pacemaker   . Renal disorder   . CAD (coronary artery disease)   . A-fib ([place])   . BPH (benign prostatic hyperplasia)     Past Surgical History  Procedure Laterality Date  . Cholecystectomy    . Pacemaker insertion    . Tonsillectomy    .  Coronary artery bypass graft    . Coronary angioplasty with stent placement    . Appendectomy      Prescriptions prior to admission  Medication Sig Dispense Refill Last Dose  . ALPRAZolam (XANAX) 0.5 MG tablet Take 0.5 mg by mouth 3 (three) times daily as needed for anxiety.     Marland Kitchen amiodarone (PACERONE) 200 MG tablet Take 100 mg by mouth daily.     Marland Kitchen atorvastatin (LIPITOR) 40 MG tablet Take 40 mg by mouth daily.     Marland Kitchen HYDROcodone-acetaminophen (NORCO/VICODIN) 5-325 MG tablet Take 1 tablet by mouth daily as needed.  0   . LANTUS SOLOSTAR 100 UNIT/ML Solostar Pen Inject 10 Units into the skin every morning.   12   . levothyroxine (SYNTHROID, LEVOTHROID) 100 MCG tablet Take 100 mcg by mouth daily before breakfast.     . metoprolol succinate (TOPROL-XL) 25 MG 24 hr tablet Take 25 mg by mouth daily.     Marland Kitchen NOVOLOG FLEXPEN 100 UNIT/ML FlexPen Inject 2-10 Units into the skin 3 (three) times daily. Per sliding scale  12   . omeprazole (PRILOSEC) 20 MG capsule Take 20 mg by mouth daily.     Marland Kitchen spironolactone (ALDACTONE) 25 MG tablet Take 25 mg by mouth daily.     Marland Kitchen albuterol (PROVENTIL HFA) 108 (90 BASE) MCG/ACT inhaler Inhale 2 puffs into the lungs every 4 (four) hours as needed for wheezing or shortness of breath. 1 Inhaler 0   . diazepam (VALIUM) 5 MG tablet Take 1 tablet (5 mg total) by mouth every 8 (eight) hours as needed for muscle spasms. 20 tablet 0   . gabapentin (NEURONTIN) 600 MG tablet Take 1 tablet by mouth 3 (three) times daily.     Marland Kitchen  ibuprofen (ADVIL,MOTRIN) 600 MG tablet Take 1 tablet (600 mg total) by mouth every 8 (eight) hours as needed. 30 tablet 0   . methocarbamol (ROBAXIN) 750 MG tablet Take 1 tablet by mouth 4 (four) times daily as needed.  0   . ondansetron (ZOFRAN ODT) 8 MG disintegrating tablet Take 1 tablet (8 mg total) by mouth every 8 (eight) hours as needed for nausea or vomiting. 20 tablet 0   . polyethylene glycol (MIRALAX / GLYCOLAX) packet Take 17 g by mouth daily. 14  each 0   . ranitidine (ZANTAC) 150 MG capsule Take 1 capsule (150 mg total) by mouth 2 (two) times daily. 28 capsule 0   . VOLTAREN 1 % GEL Apply 2 g topically 2 (two) times daily as needed.  3   . warfarin (COUMADIN) 2.5 MG tablet Take 1 tablet by mouth daily.     Marland Kitchen warfarin (COUMADIN) 5 MG tablet Take 1 tablet by mouth daily.      Social History   Social History  . Marital Status: Married    Spouse Name: N/A  . Number of Children: N/A  . Years of Education: N/A   Occupational History  . Not on file.   Social History Main Topics  . Smoking status: Former Research scientist (life sciences)  . Smokeless tobacco: Not on file  . Alcohol Use: Yes  . Drug Use: No  . Sexual Activity: Not on file   Other Topics Concern  . Not on file   Social History Narrative    Family History  Problem Relation Age of Onset  . CAD    . Diabetes    . Hypertension        Review of systems complete and found to be negative unless listed above      PHYSICAL EXAM  General: Well developed, well nourished, in no acute distress HEENT:  Normocephalic and atramatic Neck:  No JVD.  Lungs: Clear bilaterally to auscultation and percussion. Heart: HRRR . Normal S1 and S2 without gallops or murmurs.  Abdomen: Bowel sounds are positive, abdomen soft and non-tender  Msk:  Back normal, normal gait. Normal strength and tone for age. Extremities: No clubbing, cyanosis or edema.   Neuro: Alert and oriented X 3. Psych:  Good affect, responds appropriately  Labs:   Lab Results  Component Value Date   WBC 13.6* 11/23/2015   HGB 13.4 11/23/2015   HCT 40.1 11/23/2015   MCV 91.7 11/23/2015   PLT 108* 11/23/2015    Recent Labs Lab 11/22/15 1922 11/23/15 0513  NA 134* 135  K 4.2 4.4  CL 100* 101  CO2 26 27  BUN 17 16  CREATININE 1.18 1.06  CALCIUM 8.8* 8.7*  PROT 7.2  --   BILITOT 1.9*  --   ALKPHOS 55  --   ALT 14*  --   AST 25  --   GLUCOSE 111* 127*   Lab Results  Component Value Date   CKTOTAL 226  11/22/2015   TROPONINI 0.03 11/23/2015   No results found for: CHOL No results found for: HDL No results found for: LDLCALC No results found for: TRIG No results found for: CHOLHDL No results found for: LDLDIRECT    Radiology: No results found.  EKG: Sinus rhythm  ASSESSMENT AND PLAN:   1. Near syncope, atypical, no evidence for bradycardia or tachyarrhythmia 2. Episodic low blood pressure, postural dizziness, modestly improved after decrease  metoprolol dose 3. Known CAD, status post prior MI, status post CABG, status  post multiple coronary stents, currently without chest pain, with negative troponin 4. Known ischemic cardiomyopathy, without overt CHF, status post ICD, appears clinically stable  Recommendations  1. Agree with overall current therapy 2. Increase activity, ambulate with assistance, if patient clinical stable, consider discharge   Signed: Monna Crean MD,PhD, [facility] 11/23/2015, 12:49 PM

## 2015-11-23 NOTE — Progress Notes (Signed)
*  PRELIMINARY RESULTS* Echocardiogram 2D Echocardiogram has been performed.  Dakota Thomas 11/23/2015, 8:48 AM

## 2015-11-23 NOTE — Progress Notes (Signed)
Patient walked in hall with PT. Dr. Lavetta Nielsen notified.

## 2015-11-23 NOTE — Progress Notes (Signed)
Patient given discharge teaching and paperwork regarding medications, diet, follow-up appointments and activity. Patient understanding verbalized. No complaints at this time. IV and telemetry discontinued prior to leaving. Skin assessment as previously charted and vitals are stable; on room air. Patient being discharged to home. Caregiver/family present during discharge teaching. No further needs by Care Management. Home meds returned to patient. No new prescriptions.

## 2015-11-23 NOTE — Discharge Summary (Signed)
Dakota Thomas NAME: Dakota Thomas    MR#:  [id number]  DATE OF BIRTH:  Jun 10, 1934  DATE OF ADMISSION:  11/22/2015 ADMITTING PHYSICIAN: Lance Coon, MD  DATE OF DISCHARGE: 11/23/2015  PRIMARY CARE PHYSICIAN: Tracie Harrier, MD    ADMISSION DIAGNOSIS:  Orthostatic hypotension [I95.1] Near syncope [R55]  DISCHARGE DIAGNOSIS:  Principal Problem:   Near syncope Active Problems:   Diabetes (HCC)   CAD (coronary artery disease)   HTN (hypertension)   A-fib ([place])   SECONDARY DIAGNOSIS:   Past Medical History  Diagnosis Date  . Diabetes mellitus without complication ([place])   . Hypertension   . Heart attack ([place])   . Pacemaker   . Renal disorder   . CAD (coronary artery disease)   . A-fib ([place])   . BPH (benign prostatic hyperplasia)     HOSPITAL COURSE:  Dakota Thomas  is a 82 y.o. male admitted 11/22/2015 with chief complaint Near Syncope . Please see H&P performed by Lance Coon, MD for further information. Patient presented with the above complaints. Provided with IV fluid hydration improved back to baseline. He ambulated with physical therapy without difficulty or syncopal episodes. Of note he did experience a one-time fever of 101.2 workup including urinalysis and blood culture unrevealing no further evidence pointing towards infection at time of discharge. Patient provided with anticipatory guidance to return to hospital or PCP if worsens  DISCHARGE CONDITIONS:   Stable  CONSULTS OBTAINED:  Treatment Team:  Isaias Cowman, MD  DRUG ALLERGIES:   Allergies  Allergen Reactions  . Ciprofloxacin     DISCHARGE MEDICATIONS:   Current Discharge Medication List    CONTINUE these medications which have NOT CHANGED   Details  ALPRAZolam (XANAX) 0.5 MG tablet Take 0.5 mg by mouth 3 (three) times daily as needed for anxiety.    amiodarone (PACERONE) 200 MG tablet Take 100 mg by mouth daily.    atorvastatin  (LIPITOR) 40 MG tablet Take 40 mg by mouth daily.    HYDROcodone-acetaminophen (NORCO/VICODIN) 5-325 MG tablet Take 1 tablet by mouth daily as needed. Refills: 0    LANTUS SOLOSTAR 100 UNIT/ML Solostar Pen Inject 10 Units into the skin every morning.  Refills: 12    levothyroxine (SYNTHROID, LEVOTHROID) 100 MCG tablet Take 100 mcg by mouth daily before breakfast.    metoprolol succinate (TOPROL-XL) 25 MG 24 hr tablet Take 25 mg by mouth daily.    NOVOLOG FLEXPEN 100 UNIT/ML FlexPen Inject 2-10 Units into the skin 3 (three) times daily. Per sliding scale Refills: 12    omeprazole (PRILOSEC) 20 MG capsule Take 20 mg by mouth daily.    spironolactone (ALDACTONE) 25 MG tablet Take 25 mg by mouth daily.    albuterol (PROVENTIL HFA) 108 (90 BASE) MCG/ACT inhaler Inhale 2 puffs into the lungs every 4 (four) hours as needed for wheezing or shortness of breath. Qty: 1 Inhaler, Refills: 0    diazepam (VALIUM) 5 MG tablet Take 1 tablet (5 mg total) by mouth every 8 (eight) hours as needed for muscle spasms. Qty: 20 tablet, Refills: 0    gabapentin (NEURONTIN) 600 MG tablet Take 1 tablet by mouth 3 (three) times daily.    ibuprofen (ADVIL,MOTRIN) 600 MG tablet Take 1 tablet (600 mg total) by mouth every 8 (eight) hours as needed. Qty: 30 tablet, Refills: 0    methocarbamol (ROBAXIN) 750 MG tablet Take 1 tablet by mouth 4 (four) times daily as needed. Refills: 0  ondansetron (ZOFRAN ODT) 8 MG disintegrating tablet Take 1 tablet (8 mg total) by mouth every 8 (eight) hours as needed for nausea or vomiting. Qty: 20 tablet, Refills: 0    polyethylene glycol (MIRALAX / GLYCOLAX) packet Take 17 g by mouth daily. Qty: 14 each, Refills: 0    ranitidine (ZANTAC) 150 MG capsule Take 1 capsule (150 mg total) by mouth 2 (two) times daily. Qty: 28 capsule, Refills: 0    VOLTAREN 1 % GEL Apply 2 g topically 2 (two) times daily as needed. Refills: 3    !! warfarin (COUMADIN) 2.5 MG tablet Take 1  tablet by mouth daily.    !! warfarin (COUMADIN) 5 MG tablet Take 1 tablet by mouth daily.     !! - Potential duplicate medications found. Please discuss with provider.       DISCHARGE INSTRUCTIONS:    DIET:  Diabetic diet  DISCHARGE CONDITION:  Stable  ACTIVITY:  Activity as tolerated  OXYGEN:  Home Oxygen: No.   Oxygen Delivery: room air  DISCHARGE LOCATION:  home   If you experience worsening of your admission symptoms, develop shortness of breath, life threatening emergency, suicidal or homicidal thoughts you must seek medical attention immediately by calling 911 or calling your MD immediately  if symptoms less severe.  You Must read complete instructions/literature along with all the possible adverse reactions/side effects for all the Medicines you take and that have been prescribed to you. Take any new Medicines after you have completely understood and accpet all the possible adverse reactions/side effects.   Please note  You were cared for by a hospitalist during your hospital stay. If you have any questions about your discharge medications or the care you received while you were in the hospital after you are discharged, you can call the unit and asked to speak with the hospitalist on call if the hospitalist that took care of you is not available. Once you are discharged, your primary care physician will handle any further medical issues. Please note that NO REFILLS for any discharge medications will be authorized once you are discharged, as it is imperative that you return to your primary care physician (or establish a relationship with a primary care physician if you do not have one) for your aftercare needs so that they can reassess your need for medications and monitor your lab values.    On the day of Discharge:   VITAL SIGNS:  Blood pressure 124/62, pulse 80, temperature 98.5 F (36.9 C), temperature source Oral, resp. rate 20, height 6\' 1"  (1.854 m), weight  91.264 kg (201 lb 3.2 oz), SpO2 96 %.  I/O:   Intake/Output Summary (Last 24 hours) at 11/23/15 1338 Last data filed at 11/23/15 1130  Gross per 24 hour  Intake 936.25 ml  Output      0 ml  Net 936.25 ml    PHYSICAL EXAMINATION:  GENERAL:  82 y.o.-year-old patient lying in the bed with no acute distress.  EYES: Pupils equal, round, reactive to light and accommodation. No scleral icterus. Extraocular muscles intact.  HEENT: Head atraumatic, normocephalic. Oropharynx and nasopharynx clear.  NECK:  Supple, no jugular venous distention. No thyroid enlargement, no tenderness.  LUNGS: Normal breath sounds bilaterally, no wheezing, rales,rhonchi or crepitation. No use of accessory muscles of respiration.  CARDIOVASCULAR: S1, S2 irregular. No murmurs, rubs, or gallops.  ABDOMEN: Soft, non-tender, non-distended. Bowel sounds present. No organomegaly or mass.  EXTREMITIES: No pedal edema, cyanosis, or clubbing.  NEUROLOGIC: Cranial nerves  II through XII are intact. Muscle strength 5/5 in all extremities. Sensation intact. Gait not checked.  PSYCHIATRIC: The patient is alert and oriented x 3.  SKIN: No obvious rash, lesion, or ulcer.   DATA REVIEW:   CBC  Recent Labs Lab 11/23/15 0513  WBC 13.6*  HGB 13.4  HCT 40.1  PLT 108*    Chemistries   Recent Labs Lab 11/22/15 1922 11/23/15 0513  NA 134* 135  K 4.2 4.4  CL 100* 101  CO2 26 27  GLUCOSE 111* 127*  BUN 17 16  CREATININE 1.18 1.06  CALCIUM 8.8* 8.7*  MG 1.8  --   AST 25  --   ALT 14*  --   ALKPHOS 55  --   BILITOT 1.9*  --     Cardiac Enzymes  Recent Labs Lab 11/23/15 1138  TROPONINI 0.03    Microbiology Results  Results for orders placed or performed during the hospital encounter of 11/22/15  CULTURE, BLOOD (ROUTINE X 2) w Reflex to PCR ID Panel     Status: None (Preliminary result)   Collection Time: 11/23/15  9:03 AM  Result Value Ref Range Status   Specimen Description BLOOD LEFT ASSIST CONTROL   Final   Special Requests   Final    BOTTLES DRAWN AEROBIC AND ANAEROBIC  ANAEROBIC 8CC, AEROBIC 12CC   Culture NO GROWTH < 12 HOURS  Final   Report Status PENDING  Incomplete  CULTURE, BLOOD (ROUTINE X 2) w Reflex to PCR ID Panel     Status: None (Preliminary result)   Collection Time: 11/23/15  9:08 AM  Result Value Ref Range Status   Specimen Description BLOOD RIGHT ASSIST CONTROL  Final   Special Requests BOTTLES DRAWN AEROBIC AND ANAEROBIC  [place]  Final   Culture NO GROWTH < 12 HOURS  Final   Report Status PENDING  Incomplete    RADIOLOGY:  No results found.   Management plans discussed with the patient, family and they are in agreement.  CODE STATUS:     Code Status Orders        Start     Ordered   11/22/15 2321  Full code   Continuous     11/22/15 2321    Code Status History    Date Active Date Inactive Code Status Order ID Comments User Context   This patient has a current code status but no historical code status.    Advance Directive Documentation        Most Recent Value   Type of Advance Directive  Healthcare Power of Attorney, Living will   Pre-existing out of facility DNR order (yellow form or pink MOST form)     "MOST" Form in Place?        TOTAL TIME TAKING CARE OF THIS PATIENT: 28 minutes.    Hower,  Karenann Cai.D on 11/23/2015 at 1:38 PM  Between 7am to 6pm - Pager - [phone]  After 6pm go to [email or web address] - [employer] [place]  Office  [phone]  CC: Primary care physician; Tracie Harrier, MD

## 2015-11-23 NOTE — Progress Notes (Signed)
Notified Dr. Marcille Blanco of troponin of 0.04. No new orders.

## 2015-11-23 NOTE — Progress Notes (Signed)
A&O,. Up with assist. Admitted for syncopal episodes. High fall risks due to multiple falls. IV fluids infusing. Troponin at 0.04.

## 2015-11-24 ENCOUNTER — Encounter: Payer: Self-pay | Admitting: Emergency Medicine

## 2015-11-24 ENCOUNTER — Emergency Department: Payer: Commercial Managed Care - HMO

## 2015-11-24 ENCOUNTER — Inpatient Hospital Stay
Admission: EM | Admit: 2015-11-24 | Discharge: 2015-11-28 | DRG: 690 | Disposition: A | Discharge status: Home / Self Care | Payer: Commercial Managed Care - HMO | Attending: Internal Medicine | Admitting: Internal Medicine

## 2015-11-24 DIAGNOSIS — Z7901 Long term (current) use of anticoagulants: Secondary | ICD-10-CM

## 2015-11-24 DIAGNOSIS — Z7982 Long term (current) use of aspirin: Secondary | ICD-10-CM

## 2015-11-24 DIAGNOSIS — I509 Heart failure, unspecified: Secondary | ICD-10-CM | POA: Diagnosis present

## 2015-11-24 DIAGNOSIS — N400 Enlarged prostate without lower urinary tract symptoms: Secondary | ICD-10-CM | POA: Diagnosis present

## 2015-11-24 DIAGNOSIS — Z833 Family history of diabetes mellitus: Secondary | ICD-10-CM

## 2015-11-24 DIAGNOSIS — K529 Noninfective gastroenteritis and colitis, unspecified: Secondary | ICD-10-CM | POA: Diagnosis present

## 2015-11-24 DIAGNOSIS — Z9889 Other specified postprocedural states: Secondary | ICD-10-CM

## 2015-11-24 DIAGNOSIS — E1140 Type 2 diabetes mellitus with diabetic neuropathy, unspecified: Secondary | ICD-10-CM | POA: Diagnosis not present

## 2015-11-24 DIAGNOSIS — N3000 Acute cystitis without hematuria: Secondary | ICD-10-CM | POA: Diagnosis not present

## 2015-11-24 DIAGNOSIS — B952 Enterococcus as the cause of diseases classified elsewhere: Secondary | ICD-10-CM | POA: Diagnosis present

## 2015-11-24 DIAGNOSIS — I110 Hypertensive heart disease with heart failure: Secondary | ICD-10-CM | POA: Diagnosis present

## 2015-11-24 DIAGNOSIS — A499 Bacterial infection, unspecified: Secondary | ICD-10-CM | POA: Diagnosis not present

## 2015-11-24 DIAGNOSIS — Z951 Presence of aortocoronary bypass graft: Secondary | ICD-10-CM

## 2015-11-24 DIAGNOSIS — E876 Hypokalemia: Secondary | ICD-10-CM | POA: Diagnosis present

## 2015-11-24 DIAGNOSIS — N390 Urinary tract infection, site not specified: Secondary | ICD-10-CM

## 2015-11-24 DIAGNOSIS — Z794 Long term (current) use of insulin: Secondary | ICD-10-CM

## 2015-11-24 DIAGNOSIS — Z9049 Acquired absence of other specified parts of digestive tract: Secondary | ICD-10-CM

## 2015-11-24 DIAGNOSIS — Z888 Allergy status to other drugs, medicaments and biological substances status: Secondary | ICD-10-CM | POA: Diagnosis not present

## 2015-11-24 DIAGNOSIS — R7881 Bacteremia: Secondary | ICD-10-CM | POA: Diagnosis not present

## 2015-11-24 DIAGNOSIS — Z87891 Personal history of nicotine dependence: Secondary | ICD-10-CM

## 2015-11-24 DIAGNOSIS — Z955 Presence of coronary angioplasty implant and graft: Secondary | ICD-10-CM | POA: Diagnosis not present

## 2015-11-24 DIAGNOSIS — I252 Old myocardial infarction: Secondary | ICD-10-CM

## 2015-11-24 DIAGNOSIS — Z8249 Family history of ischemic heart disease and other diseases of the circulatory system: Secondary | ICD-10-CM

## 2015-11-24 DIAGNOSIS — I482 Chronic atrial fibrillation: Secondary | ICD-10-CM | POA: Diagnosis present

## 2015-11-24 DIAGNOSIS — Z95810 Presence of automatic (implantable) cardiac defibrillator: Secondary | ICD-10-CM

## 2015-11-24 DIAGNOSIS — I4891 Unspecified atrial fibrillation: Secondary | ICD-10-CM | POA: Diagnosis not present

## 2015-11-24 DIAGNOSIS — I2510 Atherosclerotic heart disease of native coronary artery without angina pectoris: Secondary | ICD-10-CM | POA: Diagnosis not present

## 2015-11-24 DIAGNOSIS — I959 Hypotension, unspecified: Secondary | ICD-10-CM | POA: Diagnosis present

## 2015-11-24 DIAGNOSIS — Z79899 Other long term (current) drug therapy: Secondary | ICD-10-CM

## 2015-11-24 DIAGNOSIS — Z0389 Encounter for observation for other suspected diseases and conditions ruled out: Secondary | ICD-10-CM | POA: Diagnosis not present

## 2015-11-24 DIAGNOSIS — E039 Hypothyroidism, unspecified: Secondary | ICD-10-CM | POA: Diagnosis present

## 2015-11-24 DIAGNOSIS — R05 Cough: Secondary | ICD-10-CM | POA: Diagnosis not present

## 2015-11-24 LAB — BLOOD CULTURE ID PANEL (REFLEXED)
Acinetobacter baumannii: NOT DETECTED
Candida albicans: NOT DETECTED
Candida glabrata: NOT DETECTED
Candida krusei: NOT DETECTED
Candida parapsilosis: NOT DETECTED
Candida tropicalis: NOT DETECTED
Carbapenem resistance: NOT DETECTED
Enterobacter cloacae complex: NOT DETECTED
Enterobacteriaceae species: NOT DETECTED
Enterococcus species: DETECTED — AB
Escherichia coli: NOT DETECTED
Haemophilus influenzae: NOT DETECTED
Klebsiella oxytoca: NOT DETECTED
Klebsiella pneumoniae: NOT DETECTED
Listeria monocytogenes: NOT DETECTED
Methicillin resistance: NOT DETECTED
Neisseria meningitidis: NOT DETECTED
Proteus species: NOT DETECTED
Pseudomonas aeruginosa: NOT DETECTED
Serratia marcescens: NOT DETECTED
Staphylococcus aureus (BCID): NOT DETECTED
Staphylococcus species: NOT DETECTED
Streptococcus agalactiae: NOT DETECTED
Streptococcus pneumoniae: NOT DETECTED
Streptococcus pyogenes: NOT DETECTED
Streptococcus species: NOT DETECTED
Vancomycin resistance: NOT DETECTED

## 2015-11-24 LAB — CBC WITH DIFFERENTIAL/PLATELET
Basophils Absolute: 0 10*3/uL (ref 0–0.1)
Basophils Relative: 0 %
Eosinophils Absolute: 0.1 10*3/uL (ref 0–0.7)
Eosinophils Relative: 1 %
HCT: 36.5 % — ABNORMAL LOW (ref 40.0–52.0)
Hemoglobin: 12.4 g/dL — ABNORMAL LOW (ref 13.0–18.0)
Lymphocytes Relative: 16 %
Lymphs Abs: 1.4 10*3/uL (ref 1.0–3.6)
MCH: 30.9 pg (ref 26.0–34.0)
MCHC: 34 g/dL (ref 32.0–36.0)
MCV: 90.9 fL (ref 80.0–100.0)
Monocytes Absolute: 1.2 10*3/uL — ABNORMAL HIGH (ref 0.2–1.0)
Monocytes Relative: 13 %
Neutro Abs: 6.3 10*3/uL (ref 1.4–6.5)
Neutrophils Relative %: 70 %
Platelets: 121 10*3/uL — ABNORMAL LOW (ref 150–440)
RBC: 4.01 MIL/uL — ABNORMAL LOW (ref 4.40–5.90)
RDW: 14.5 % (ref 11.5–14.5)
WBC: 8.9 10*3/uL (ref 3.8–10.6)

## 2015-11-24 LAB — COMPREHENSIVE METABOLIC PANEL
ALT: 14 U/L — ABNORMAL LOW (ref 17–63)
AST: 19 U/L (ref 15–41)
Albumin: 3.3 g/dL — ABNORMAL LOW (ref 3.5–5.0)
Alkaline Phosphatase: 48 U/L (ref 38–126)
Anion gap: 8 (ref 5–15)
BUN: 16 mg/dL (ref 6–20)
CO2: 24 mmol/L (ref 22–32)
Calcium: 8.5 mg/dL — ABNORMAL LOW (ref 8.9–10.3)
Chloride: 102 mmol/L (ref 101–111)
Creatinine, Ser: 0.96 mg/dL (ref 0.61–1.24)
GFR calc Af Amer: >60 mL/min (ref >60)
GFR calc non Af Amer: >60 mL/min (ref >60)
Glucose, Bld: 181 mg/dL — ABNORMAL HIGH (ref 65–99)
Potassium: 3.4 mmol/L — ABNORMAL LOW (ref 3.5–5.1)
Sodium: 134 mmol/L — ABNORMAL LOW (ref 135–145)
Total Bilirubin: 1.4 mg/dL — ABNORMAL HIGH (ref 0.3–1.2)
Total Protein: 6.8 g/dL (ref 6.5–8.1)

## 2015-11-24 LAB — GLUCOSE, CAPILLARY
Glucose-Capillary: 110 mg/dL — ABNORMAL HIGH (ref 65–99)
Glucose-Capillary: 197 mg/dL — ABNORMAL HIGH (ref 65–99)

## 2015-11-24 LAB — PROTIME-INR
INR: 2.45
Prothrombin Time: 26.3 seconds — ABNORMAL HIGH (ref 11.4–15.0)

## 2015-11-24 LAB — URINALYSIS COMPLETE WITH MICROSCOPIC (ARMC ONLY)
Bilirubin Urine: NEGATIVE
Glucose, UA: NEGATIVE mg/dL
Hgb urine dipstick: NEGATIVE
Leukocytes, UA: NEGATIVE
Nitrite: NEGATIVE
Protein, ur: NEGATIVE mg/dL
Specific Gravity, Urine: 1.012 (ref 1.005–1.030)
Squamous Epithelial / LPF: NONE SEEN
pH: 6 (ref 5.0–8.0)

## 2015-11-24 LAB — MAGNESIUM: Magnesium: 1.8 mg/dL (ref 1.7–2.4)

## 2015-11-24 MED ORDER — WARFARIN SODIUM 5 MG PO TABS: 5.0000 mg | ORAL_TABLET | ORAL | Status: DC

## 2015-11-24 MED ORDER — ALPRAZOLAM 0.5 MG PO TABS
0.5000 mg | ORAL_TABLET | Freq: Once | ORAL | Status: AC
  Administered 2015-11-24: 0.5 mg via ORAL
  Filled 2015-11-24: qty 1

## 2015-11-24 MED ORDER — ACETAMINOPHEN 325 MG PO TABS: 650.0000 mg | ORAL_TABLET | Freq: Four times a day (QID) | ORAL | Status: DC | PRN

## 2015-11-24 MED ORDER — DEXTROSE 5 % IV SOLN
1.0000 g | Freq: Once | INTRAVENOUS | Status: AC
  Administered 2015-11-24: 1 g via INTRAVENOUS
  Filled 2015-11-24: qty 10

## 2015-11-24 MED ORDER — HYDROCODONE-ACETAMINOPHEN 5-325 MG PO TABS
1.0000 | ORAL_TABLET | Freq: Once | ORAL | Status: AC
  Administered 2015-11-24: 1 via ORAL
  Filled 2015-11-24: qty 1

## 2015-11-24 MED ORDER — VITAMIN B-6 50 MG PO TABS
100.0000 mg | ORAL_TABLET | Freq: Every day | ORAL | Status: DC
  Administered 2015-11-26 – 2015-11-27 (×2): 100 mg via ORAL
  Filled 2015-11-24: qty 1
  Filled 2015-11-24 (×4): qty 2

## 2015-11-24 MED ORDER — ALPRAZOLAM 0.5 MG PO TABS
0.5000 mg | ORAL_TABLET | Freq: Three times a day (TID) | ORAL | Status: DC | PRN
  Administered 2015-11-24 – 2015-11-27 (×9): 0.5 mg via ORAL
  Filled 2015-11-24 (×9): qty 1

## 2015-11-24 MED ORDER — WARFARIN SODIUM 5 MG PO TABS
2.5000 mg | ORAL_TABLET | ORAL | Status: DC
  Administered 2015-11-24: 2.5 mg via ORAL
  Filled 2015-11-24: qty 1

## 2015-11-24 MED ORDER — INSULIN ASPART 100 UNIT/ML ~~LOC~~ SOLN
0.0000 [IU] | Freq: Every day | SUBCUTANEOUS | Status: DC
Start: 2015-11-24 — End: 2015-11-28

## 2015-11-24 MED ORDER — POTASSIUM CHLORIDE CRYS ER 20 MEQ PO TBCR
40.0000 meq | EXTENDED_RELEASE_TABLET | Freq: Once | ORAL | Status: AC
  Administered 2015-11-24: 40 meq via ORAL
  Filled 2015-11-24: qty 2

## 2015-11-24 MED ORDER — LEVOTHYROXINE SODIUM 100 MCG PO TABS
100.0000 ug | ORAL_TABLET | Freq: Every day | ORAL | Status: DC
  Administered 2015-11-25 – 2015-11-27 (×3): 100 ug via ORAL
  Filled 2015-11-24 (×3): qty 1

## 2015-11-24 MED ORDER — LOPERAMIDE HCL 2 MG PO CAPS
2.0000 mg | ORAL_CAPSULE | ORAL | Status: DC | PRN
  Administered 2015-11-26 – 2015-11-27 (×7): 2 mg via ORAL
  Filled 2015-11-24 (×7): qty 1

## 2015-11-24 MED ORDER — INSULIN GLARGINE 100 UNIT/ML ~~LOC~~ SOLN
25.0000 [IU] | Freq: Every day | SUBCUTANEOUS | Status: DC
  Administered 2015-11-24 – 2015-11-27 (×4): 25 [IU] via SUBCUTANEOUS
  Filled 2015-11-24 (×5): qty 0.25

## 2015-11-24 MED ORDER — PIPERACILLIN-TAZOBACTAM 3.375 G IVPB 30 MIN
3.3750 g | INTRAVENOUS | Status: AC
  Administered 2015-11-24: 3.375 g via INTRAVENOUS
  Filled 2015-11-24: qty 50

## 2015-11-24 MED ORDER — GABAPENTIN 600 MG PO TABS
600.0000 mg | ORAL_TABLET | Freq: Three times a day (TID) | ORAL | Status: DC
  Administered 2015-11-24 – 2015-11-27 (×10): 600 mg via ORAL
  Filled 2015-11-24 (×11): qty 1

## 2015-11-24 MED ORDER — ONDANSETRON HCL 4 MG/2ML IJ SOLN: 4.0000 mg | Freq: Four times a day (QID) | INTRAMUSCULAR | Status: DC | PRN

## 2015-11-24 MED ORDER — ALBUTEROL SULFATE (2.5 MG/3ML) 0.083% IN NEBU: 2.5000 mg | INHALATION_SOLUTION | RESPIRATORY_TRACT | Status: DC | PRN

## 2015-11-24 MED ORDER — GABAPENTIN 300 MG PO CAPS
600.0000 mg | ORAL_CAPSULE | Freq: Once | ORAL | Status: AC
  Administered 2015-11-24: 600 mg via ORAL
  Filled 2015-11-24: qty 2

## 2015-11-24 MED ORDER — AMIODARONE HCL 200 MG PO TABS
100.0000 mg | ORAL_TABLET | Freq: Every day | ORAL | Status: DC
  Administered 2015-11-25 – 2015-11-27 (×3): 100 mg via ORAL
  Filled 2015-11-24 (×3): qty 1

## 2015-11-24 MED ORDER — PANTOPRAZOLE SODIUM 40 MG PO TBEC
40.0000 mg | DELAYED_RELEASE_TABLET | Freq: Every day | ORAL | Status: DC
  Administered 2015-11-25 – 2015-11-27 (×3): 40 mg via ORAL
  Filled 2015-11-24 (×3): qty 1

## 2015-11-24 MED ORDER — WARFARIN - PHARMACIST DOSING INPATIENT
Freq: Every day | Status: DC
  Administered 2015-11-24 – 2015-11-27 (×4)

## 2015-11-24 MED ORDER — INSULIN ASPART 100 UNIT/ML ~~LOC~~ SOLN
0.0000 [IU] | Freq: Three times a day (TID) | SUBCUTANEOUS | Status: DC
  Administered 2015-11-25 – 2015-11-26 (×4): 1 [IU] via SUBCUTANEOUS
  Administered 2015-11-26: 2 [IU] via SUBCUTANEOUS
  Administered 2015-11-26: 1 [IU] via SUBCUTANEOUS
  Administered 2015-11-27: 3 [IU] via SUBCUTANEOUS
  Filled 2015-11-24: qty 1
  Filled 2015-11-24: qty 2
  Filled 2015-11-24 (×3): qty 1
  Filled 2015-11-24: qty 3
  Filled 2015-11-24: qty 1

## 2015-11-24 MED ORDER — SODIUM CHLORIDE 0.9 % IV SOLN
INTRAVENOUS | Status: DC
  Administered 2015-11-24 – 2015-11-25 (×2): via INTRAVENOUS

## 2015-11-24 MED ORDER — ONDANSETRON HCL 4 MG PO TABS: 4.0000 mg | ORAL_TABLET | Freq: Four times a day (QID) | ORAL | Status: DC | PRN

## 2015-11-24 MED ORDER — ACETAMINOPHEN 650 MG RE SUPP: 650.0000 mg | Freq: Four times a day (QID) | RECTAL | Status: DC | PRN

## 2015-11-24 MED ORDER — OMEGA-3-ACID ETHYL ESTERS 1 G PO CAPS
1000.0000 mg | ORAL_CAPSULE | Freq: Every day | ORAL | Status: DC
Start: 2015-11-24 — End: 2015-11-28
  Administered 2015-11-25 – 2015-11-27 (×3): 1000 mg via ORAL
  Filled 2015-11-24 (×3): qty 1

## 2015-11-24 MED ORDER — PIPERACILLIN-TAZOBACTAM 3.375 G IVPB
3.3750 g | Freq: Three times a day (TID) | INTRAVENOUS | Status: DC
  Administered 2015-11-24 – 2015-11-25 (×2): 3.375 g via INTRAVENOUS
  Filled 2015-11-24 (×4): qty 50

## 2015-11-24 NOTE — Progress Notes (Signed)
ANTICOAGULATION CONSULT NOTE - Initial Consult  Pharmacy Consult for Coumadin Indication: atrial fibrillation  Allergies  Allergen Reactions  . Ciprofloxacin   . Levaquin [Levofloxacin] Swelling    Reaction: swelling of throat   . Tannic Acid Hives    Patient Measurements: Height: 6\' 1"  (185.4 cm) Weight: 202 lb (91.627 kg) IBW/kg (Calculated) : 79.9  Vital Signs: Temp: 98.4 F (36.9 C) (05/11 0923) Temp Source: Oral (05/11 0923) BP: 117/65 mmHg (05/11 1500) Pulse Rate: 59 (05/11 1500)  Labs:  Recent Labs  11/22/15 1715  11/22/15 1922 11/22/15 2325 11/23/15 0513 11/23/15 1138 11/24/15 1006  HGB 13.8  --   --   --  13.4  --  12.4*  HCT 41.4  --   --   --  40.1  --  36.5*  PLT 120*  --   --   --  108*  --  121*  LABPROT  --   --   --   --   --   --  26.3*  INR  --   --   --   --   --   --  2.45  CREATININE  --   --  1.18  --  1.06  --  0.96  CKTOTAL  --   --  226  --   --   --   --   TROPONINI  --   < > 0.03 0.04* 0.03 0.03  --   < > = values in this interval not displayed.  Estimated Creatinine Clearance: 70.5 mL/min (by C-G formula based on Cr of 0.96).   Medical History: Past Medical History  Diagnosis Date  . Diabetes mellitus without complication ([place])   . Hypertension   . Heart attack ([place])   . Pacemaker   . Renal disorder   . CAD (coronary artery disease)   . A-fib ([place])   . BPH (benign prostatic hyperplasia)     Medications:  Prescriptions prior to admission  Medication Sig Dispense Refill Last Dose  . Alpha-Lipoic Acid 600 MG CAPS Take 600 mg by mouth daily.   11/23/2015 at Unknown time  . ALPRAZolam (XANAX) 0.5 MG tablet Take 0.5 mg by mouth 3 (three) times daily as needed for anxiety.   prn at prn  . amiodarone (PACERONE) 200 MG tablet Take 100 mg by mouth daily.   11/24/2015 at Unknown time  . Coenzyme Q10 (CO Q 10) 10 MG CAPS Take 10 mg by mouth daily.   11/24/2015 at Unknown time  . gabapentin (NEURONTIN) 600 MG tablet Take 1 tablet by  mouth 3 (three) times daily.   11/24/2015 at Unknown time  . HYDROcodone-acetaminophen (NORCO/VICODIN) 5-325 MG tablet Take 1 tablet by mouth daily as needed.  0 prn at prn  . insulin glargine (LANTUS) 100 unit/mL SOPN Inject 25 Units into the skin at bedtime.   11/23/2015 at Unknown time  . levothyroxine (SYNTHROID, LEVOTHROID) 100 MCG tablet Take 100 mcg by mouth daily before breakfast.   11/24/2015 at Unknown time  . loperamide (IMODIUM) 2 MG capsule Take 2 mg by mouth as needed for diarrhea or loose stools.   prn at prn  . metoprolol succinate (TOPROL-XL) 25 MG 24 hr tablet Take 25 mg by mouth daily.   11/24/2015 at 0600  . Multiple Vitamin (MULTIVITAMIN WITH MINERALS) TABS tablet Take 1 tablet by mouth daily.   11/23/2015 at Unknown time  . NOVOLOG FLEXPEN 100 UNIT/ML FlexPen Inject 2-10 Units into the skin 3 (three) times  daily. Per sliding scale  12 11/24/2015 at Unknown time  . omeprazole (PRILOSEC) 20 MG capsule Take 20 mg by mouth daily.   11/24/2015 at Unknown time  . Potassium Gluconate 550 (90 K) MG TABS Take 90 mg by mouth every other day.   11/23/2015 at Unknown time  . pyridOXINE (B-6) 50 MG tablet Take 100 mg by mouth daily.   11/23/2015 at Unknown time  . spironolactone (ALDACTONE) 25 MG tablet Take 25 mg by mouth daily.   11/24/2015 at Unknown time  . testosterone cypionate (DEPOTESTOSTERONE CYPIONATE) 200 MG/ML injection Inject 200 mg into the muscle every 28 (twenty-eight) days.   Past Month at Unknown time  . warfarin (COUMADIN) 2.5 MG tablet Take 1 tablet by mouth daily. Take 2.5mg  Monday and Thursday   11/21/2015 at pm   . warfarin (COUMADIN) 5 MG tablet Take 1 tablet by mouth daily. Take 5mg  Sunday, Tuesday, Wednesday, Friday, Saturday   11/23/2015 at Unknown time   Scheduled:  . amiodarone  100 mg Oral Daily  . gabapentin  600 mg Oral TID  . insulin aspart  0-5 Units Subcutaneous QHS  . insulin aspart  0-9 Units Subcutaneous TID WC  . insulin glargine  25 Units Subcutaneous QHS  .  [START ON 11/25/2015] levothyroxine  100 mcg Oral QAC breakfast  . omega-3 acid ethyl esters  1,000 mg Oral Daily  . pantoprazole  40 mg Oral Daily  . piperacillin-tazobactam (ZOSYN)  IV  3.375 g Intravenous Q8H  . potassium chloride  40 mEq Oral Once  . pyridOXINE  100 mg Oral Daily  . warfarin  2.5 mg Oral Once per day on Mon Thu  . [START ON 11/25/2015] warfarin  5 mg Oral Once per day on Sun Tue Wed Fri Sat  . Warfarin - Pharmacist Dosing Inpatient   Does not apply q1800   Infusions:  . sodium chloride 50 mL/hr at 11/24/15 1645    Assessment: 82 y/o M admitted with bacteremia on warfarin PTA for atrial fibrillation.   Goal of Therapy:  INR 2-3  Plan:  INR is therapeutic at 2.45. Will continue outpatient dosing of Coumadin 5 mg daily except 2.5 mg on Monday and Thursday. Will f/u AM INR.   Ulice Dash D 11/24/2015,5:39 PM

## 2015-11-24 NOTE — ED Provider Notes (Signed)
[facility] Emergency Department Provider Note   ____________________________________________  Time seen: Approximately 10am I have reviewed the triage vital signs and the triage nursing note.  HISTORY  Chief Complaint Abnormal Labs    Historian Patient  HPI Dakota Thomas is a 82 y.o. male who was admitted overnight 5/9-5/10 ( discharged yesterday) for near syncope. He was found at that time had an elevated white blood cell count, but no documented fevers, negative blood cultures within 12 hours anddischarged home feeling a little bit better.  Patient states he was called this morning and told he had an abnormal laboratory finding.  Patient states he's continued to feel fatigued and multiple episodes of dizziness/near syncope. No chest pain. He's felt hot without a documented fever.  This morning she had some urinary incontinence which is abnormal for him. Denies frequency or dysuria.    Past Medical History  Diagnosis Date  . Diabetes mellitus without complication ([place])   . Hypertension   . Heart attack ([place])   . Pacemaker   . Renal disorder   . CAD (coronary artery disease)   . A-fib ([place])   . BPH (benign prostatic hyperplasia)     Patient Active Problem List   Diagnosis Date Noted  . Bacteremia 11/24/2015  . Near syncope 11/22/2015  . Diabetes ([place]) 11/22/2015  . CAD (coronary artery disease) 11/22/2015  . HTN (hypertension) 11/22/2015  . A-fib ([place]) 11/22/2015  . BPH (benign prostatic hyperplasia) 11/22/2015    Past Surgical History  Procedure Laterality Date  . Cholecystectomy    . Pacemaker insertion    . Tonsillectomy    . Coronary artery bypass graft    . Coronary angioplasty with stent placement    . Appendectomy      Current Outpatient Rx  Name  Route  Sig  Dispense  Refill  . Alpha-Lipoic Acid 600 MG CAPS   Oral   Take 600 mg by mouth daily.         Marland Kitchen ALPRAZolam (XANAX) 0.5 MG tablet   Oral   Take 0.5 mg by mouth  3 (three) times daily as needed for anxiety.         Marland Kitchen amiodarone (PACERONE) 200 MG tablet   Oral   Take 100 mg by mouth daily.         . Coenzyme Q10 (CO Q 10) 10 MG CAPS   Oral   Take 10 mg by mouth daily.         Marland Kitchen gabapentin (NEURONTIN) 600 MG tablet   Oral   Take 1 tablet by mouth 3 (three) times daily.         Marland Kitchen HYDROcodone-acetaminophen (NORCO/VICODIN) 5-325 MG tablet   Oral   Take 1 tablet by mouth daily as needed.      0   . insulin glargine (LANTUS) 100 unit/mL SOPN   Subcutaneous   Inject 25 Units into the skin at bedtime.         Marland Kitchen levothyroxine (SYNTHROID, LEVOTHROID) 100 MCG tablet   Oral   Take 100 mcg by mouth daily before breakfast.         . loperamide (IMODIUM) 2 MG capsule   Oral   Take 2 mg by mouth as needed for diarrhea or loose stools.         . metoprolol succinate (TOPROL-XL) 25 MG 24 hr tablet   Oral   Take 25 mg by mouth daily.         Marland Kitchen  Multiple Vitamin (MULTIVITAMIN WITH MINERALS) TABS tablet   Oral   Take 1 tablet by mouth daily.         Marland Kitchen NOVOLOG FLEXPEN 100 UNIT/ML FlexPen   Subcutaneous   Inject 2-10 Units into the skin 3 (three) times daily. Per sliding scale      12     Dispense as written.   Marland Kitchen omeprazole (PRILOSEC) 20 MG capsule   Oral   Take 20 mg by mouth daily.         . Potassium Gluconate 550 (90 K) MG TABS   Oral   Take 90 mg by mouth every other day.         . pyridOXINE (B-6) 50 MG tablet   Oral   Take 100 mg by mouth daily.         Marland Kitchen spironolactone (ALDACTONE) 25 MG tablet   Oral   Take 25 mg by mouth daily.         Marland Kitchen testosterone cypionate (DEPOTESTOSTERONE CYPIONATE) 200 MG/ML injection   Intramuscular   Inject 200 mg into the muscle every 28 (twenty-eight) days.         Marland Kitchen warfarin (COUMADIN) 2.5 MG tablet   Oral   Take 1 tablet by mouth daily. Take 2.5mg  Monday and Thursday         . warfarin (COUMADIN) 5 MG tablet   Oral   Take 1 tablet by mouth daily. Take 5mg   Sunday, Tuesday, Wednesday, Friday, Saturday           Allergies Ciprofloxacin; Levaquin; and Tannic acid  Family History  Problem Relation Age of Onset  . CAD    . Diabetes    . Hypertension      Social History Social History  Substance Use Topics  . Smoking status: Former Research scientist (life sciences)  . Smokeless tobacco: None  . Alcohol Use: Yes    Review of Systems  Constitutional:Subjective fevers without documented. Eyes: Negative for visual changes. ENT: Negative for sore throat. Cardiovascular: Negative for chest pain. Respiratory: Negative for shortness of breath. Gastrointestinal: Negative for abdominal pain, vomiting and diarrhea. Genitourinary: Negative for dysuria. Musculoskeletal: Negative for back pain. Skin: Negative for rash. Neurological: Negative for headache. Positive for dizziness and fatigue. 10 point Review of Systems otherwise negative ____________________________________________   PHYSICAL EXAM:  VITAL SIGNS: ED Triage Vitals  Enc Vitals Group     BP 11/24/15 0923 101/65 mmHg     Pulse Rate 11/24/15 0923 72     Resp 11/24/15 0923 16     Temp 11/24/15 0923 98.4 F (36.9 C)     Temp Source 11/24/15 0923 Oral     SpO2 11/24/15 0923 92 %     Weight 11/24/15 0923 202 lb (91.627 kg)     Height 11/24/15 0923 6\' 1"  (1.854 m)     Head Cir --      Peak Flow --      Pain Score 11/24/15 0927 0     Pain Loc --      Pain Edu? --      Excl. in [place]? --      Constitutional: Alert and oriented. Well appearing and in no distress. HEENT   Head: Normocephalic and atraumatic.      Eyes: Conjunctivae are normal. PERRL. Normal extraocular movements.      Ears:         Nose: No congestion/rhinnorhea.   Mouth/Throat: Mucous membranes are moist.   Neck: No stridor. Cardiovascular/Chest: Normal rate, regular  rhythm.  No murmurs, rubs, or gallops. Respiratory: Normal respiratory effort without tachypnea nor retractions. Breath sounds are clear and equal  bilaterally. No wheezes/rales/rhonchi. Gastrointestinal: Soft. No distention, no guarding, no rebound. Nontender.    Genitourinary/rectal:Deferred Musculoskeletal: Nontender with normal range of motion in all extremities. No joint effusions.  No lower extremity tenderness.  No edema. Neurologic:  Normal speech and language. No gross or focal neurologic deficits are appreciated. Skin:  Skin is warm, dry and intact. No rash noted. Psychiatric: Mood and affect are normal. Speech and behavior are normal. Patient exhibits appropriate insight and judgment.  ____________________________________________   EKG I, Lisa Roca, MD, the attending physician have personally viewed and interpreted all ECGs.  None ____________________________________________  LABS (pertinent positives/negatives)  Urinalysis rare bacteria, 0-5 white blood cells, 0-5 red blood cells, negative for nitrites and trace ketones are present White blood count 8.9, hemoglobin is 12.4 and platelet count 121 Sodium 134, potassium 3.4 and otherwise no significant abnormalities on metabolic panel  ____________________________________________  RADIOLOGY All Xrays were viewed by me. Imaging interpreted by Radiologist.  Chest two-view: No acute cardio point process. __________________________________________  PROCEDURES  Procedure(s) performed: None  Critical Care performed: None  ____________________________________________   ED COURSE / ASSESSMENT AND PLAN  Pertinent labs & imaging results that were available during my care of the patient were reviewed by me and considered in my medical decision making (see chart for details).   I reviewed patient's blood culture result positive for enterococcus within the last 24 hours.  In terms of his symptoms, he is stating that the near syncope is a little bit improved, but still there and he still very fatigued. He is also now having some urinary incontinence.  His white  blood cell count is actually improved and within normal limits today, however I am concerned about the positive enterococcus blood culture indicating bacteremia with some persistent symptoms.  I am going to give him a dose of Rocephin, thinking that the most likely etiology may be UTI. His urine does show rare bacteria culture will be sent.    CONSULTATIONS:   Hospitalist for [employer]. Bridgett Larsson.   Patient / Family / Caregiver informed of clinical course, medical decision-making process, and agree with plan.   ___________________________________________   FINAL CLINICAL IMPRESSION(S) / ED DIAGNOSES   Final diagnoses:  UTI (lower urinary tract infection)  Bacteremia              Note: This dictation was prepared with Dragon dictation. Any transcriptional errors that result from this process are unintentional   Lisa Roca, MD 11/24/15 1433

## 2015-11-24 NOTE — ED Notes (Signed)
Pt presents to ED after receiving a call from his pharmacy telling him his labwork was abnormal. Pt states he does not know what labs are abnormal. Pt states he feels a little foggy and has no bladder control. Pt states was discharged from [facility] yesterday after being treated for dehydration.

## 2015-11-24 NOTE — H&P (Addendum)
[facility] [place] NAME: Dakota Thomas    MR#:  [id number]  DATE OF BIRTH:  02-22-1934  DATE OF ADMISSION:  11/24/2015  PRIMARY CARE PHYSICIAN: Tracie Harrier, MD   REQUESTING/REFERRING PHYSICIAN: Lisa Roca, MD  CHIEF COMPLAINT:   Chief Complaint  Patient presents with  . Abnormal Labs    Positive blood culture. HISTORY OF PRESENT ILLNESS:  Dakota Thomas  is a 82 y.o. male with a known history of Hypertension, chronic A. fib and CAD. The patient was discharged yesterday after workup for near syncope. The patient has no complaints. He was called back due to positive blood culture, which showed positive enterococcus. He complains of fatigue and multiple episodes of dizziness and near syncope episode. He has no chest pain or palpitation. He had leukocytosis 2 days ago, but WBC is normal today. His blood pressure decreased to 80s, was treated with normal saline.  PAST MEDICAL HISTORY:   Past Medical History  Diagnosis Date  . Diabetes mellitus without complication ([place])   . Hypertension   . Heart attack ([place])   . Pacemaker   . Renal disorder   . CAD (coronary artery disease)   . A-fib ([place])   . BPH (benign prostatic hyperplasia)     PAST SURGICAL HISTORY:   Past Surgical History  Procedure Laterality Date  . Cholecystectomy    . Pacemaker insertion    . Tonsillectomy    . Coronary artery bypass graft    . Coronary angioplasty with stent placement    . Appendectomy      SOCIAL HISTORY:   Social History  Substance Use Topics  . Smoking status: Former Research scientist (life sciences)  . Smokeless tobacco: Not on file  . Alcohol Use: Yes    FAMILY HISTORY:   Family History  Problem Relation Age of Onset  . CAD    . Diabetes    . Hypertension      DRUG ALLERGIES:   Allergies  Allergen Reactions  . Ciprofloxacin   . Levaquin [Levofloxacin] Swelling    Reaction: swelling of throat   . Tannic Acid Hives    REVIEW OF  SYSTEMS:  CONSTITUTIONAL: No fever, Dizziness and generalized weakness.  EYES: No blurred or double vision.  EARS, NOSE, AND THROAT: No tinnitus or ear pain.  RESPIRATORY: No cough, shortness of breath, wheezing or hemoptysis.  CARDIOVASCULAR: No chest pain, orthopnea, edema.  GASTROINTESTINAL: No nausea, vomiting,  or abdominal pain. Had chronic diarrhea, but no bowel movement today. GENITOURINARY: No dysuria, hematuria.  ENDOCRINE: No polyuria, nocturia,  HEMATOLOGY: No anemia, easy bruising or bleeding SKIN: No rash or lesion. MUSCULOSKELETAL: No joint pain or arthritis.   NEUROLOGIC: No tingling, numbness, weakness.  PSYCHIATRY: No anxiety or depression.   MEDICATIONS AT HOME:   Prior to Admission medications   Medication Sig Start Date End Date Taking? Authorizing Provider  Alpha-Lipoic Acid 600 MG CAPS Take 600 mg by mouth daily.   Yes Historical Provider, MD  ALPRAZolam Duanne Moron) 0.5 MG tablet Take 0.5 mg by mouth 3 (three) times daily as needed for anxiety.   Yes Historical Provider, MD  amiodarone (PACERONE) 200 MG tablet Take 100 mg by mouth daily.   Yes Historical Provider, MD  Coenzyme Q10 (CO Q 10) 10 MG CAPS Take 10 mg by mouth daily.   Yes Historical Provider, MD  gabapentin (NEURONTIN) 600 MG tablet Take 1 tablet by mouth 3 (three) times daily. 10/10/15  Yes Historical Provider, MD  HYDROcodone-acetaminophen (  NORCO/VICODIN) 5-325 MG tablet Take 1 tablet by mouth daily as needed. 10/12/15  Yes Historical Provider, MD  insulin glargine (LANTUS) 100 unit/mL SOPN Inject 25 Units into the skin at bedtime.   Yes Historical Provider, MD  levothyroxine (SYNTHROID, LEVOTHROID) 100 MCG tablet Take 100 mcg by mouth daily before breakfast.   Yes Historical Provider, MD  loperamide (IMODIUM) 2 MG capsule Take 2 mg by mouth as needed for diarrhea or loose stools.   Yes Historical Provider, MD  metoprolol succinate (TOPROL-XL) 25 MG 24 hr tablet Take 25 mg by mouth daily.   Yes Historical  Provider, MD  Multiple Vitamin (MULTIVITAMIN WITH MINERALS) TABS tablet Take 1 tablet by mouth daily.   Yes Historical Provider, MD  NOVOLOG FLEXPEN 100 UNIT/ML FlexPen Inject 2-10 Units into the skin 3 (three) times daily. Per sliding scale 10/04/15  Yes Historical Provider, MD  omeprazole (PRILOSEC) 20 MG capsule Take 20 mg by mouth daily.   Yes Historical Provider, MD  Potassium Gluconate 550 (90 K) MG TABS Take 90 mg by mouth every other day.   Yes Historical Provider, MD  pyridOXINE (B-6) 50 MG tablet Take 100 mg by mouth daily.   Yes Historical Provider, MD  spironolactone (ALDACTONE) 25 MG tablet Take 25 mg by mouth daily.   Yes Historical Provider, MD  testosterone cypionate (DEPOTESTOSTERONE CYPIONATE) 200 MG/ML injection Inject 200 mg into the muscle every 28 (twenty-eight) days.   Yes Historical Provider, MD  warfarin (COUMADIN) 2.5 MG tablet Take 1 tablet by mouth daily. Take 2.5mg  Monday and Thursday 09/09/15  Yes Historical Provider, MD  warfarin (COUMADIN) 5 MG tablet Take 1 tablet by mouth daily. Take 5mg  Sunday, Tuesday, Wednesday, Friday, Saturday 09/09/15  Yes Historical Provider, MD      VITAL SIGNS:  Blood pressure 117/65, pulse 59, temperature 98.4 F (36.9 C), temperature source Oral, resp. rate 16, height 6\' 1"  (1.854 m), weight 91.627 kg (202 lb), SpO2 92 %.  PHYSICAL EXAMINATION:  GENERAL:  82 y.o.-year-old patient lying in the bed with no acute distress.  EYES: Pupils equal, round, reactive to light and accommodation. No scleral icterus. Extraocular muscles intact.  HEENT: Head atraumatic, normocephalic. Oropharynx and nasopharynx clear.  NECK:  Supple, no jugular venous distention. No thyroid enlargement, no tenderness.  LUNGS: Normal breath sounds bilaterally, no wheezing, rales,rhonchi or crepitation. No use of accessory muscles of respiration.  CARDIOVASCULAR: S1, S2 normal. No murmurs, rubs, or gallops.  ABDOMEN: Soft, nontender, nondistended. Bowel sounds  present. No organomegaly or mass.  EXTREMITIES: No pedal edema, cyanosis, or clubbing.  NEUROLOGIC: Cranial nerves II through XII are intact. Muscle strength 5/5 in all extremities. Sensation intact. Gait not checked.  PSYCHIATRIC: The patient is alert and oriented x 3.  SKIN: No obvious rash, lesion, or ulcer.   LABORATORY PANEL:   CBC  Recent Labs Lab 11/24/15 1006  WBC 8.9  HGB 12.4*  HCT 36.5*  PLT 121*   ------------------------------------------------------------------------------------------------------------------  Chemistries   Recent Labs Lab 11/22/15 1922  11/24/15 1006  NA 134*  < > 134*  K 4.2  < > 3.4*  CL 100*  < > 102  CO2 26  < > 24  GLUCOSE 111*  < > 181*  BUN 17  < > 16  CREATININE 1.18  < > 0.96  CALCIUM 8.8*  < > 8.5*  MG 1.8  --   --   AST 25  --  19  ALT 14*  --  14*  ALKPHOS 55  --  48  BILITOT 1.9*  --  1.4*  < > = values in this interval not displayed. ------------------------------------------------------------------------------------------------------------------  Cardiac Enzymes  Recent Labs Lab 11/23/15 1138  TROPONINI 0.03   ------------------------------------------------------------------------------------------------------------------  RADIOLOGY:  Dg Chest 2 View  11/24/2015  CLINICAL DATA:  Cough with leukocytosis. History of diabetes and hypertension. EXAM: CHEST  2 VIEW COMPARISON:  07/02/2015 and 06/21/2015. FINDINGS: The left subclavian AICD leads appear unchanged. The heart size and mediastinal contours are stable status post CABG. There is interval improved aeration of the lungs which are now clear. There is mild chronic central airway thickening. There is a stable calcified left lower lobe granuloma. No pleural effusion or pneumothorax. Mild degenerative changes throughout the spine are stable. IMPRESSION: No acute cardiopulmonary process. Electronically Signed   By: Richardean Sale M.D.   On: 11/24/2015 10:40    EKG:    Orders placed or performed during the hospital encounter of 11/22/15  . ED EKG  . ED EKG  . EKG 12-Lead  . EKG 12-Lead    IMPRESSION AND PLAN:   Bacteremia. The patient will be admitted to medical floor. Continue Zosyn. Follow-up blood culture and ID consult.  Hypotension. Hold hypertension medication and give IV fluid support.  Hypokalemia. Give potassium supplement and follow-up magnesium level and potassium level.  Hypertension. Hold hypertension medication due to hypotension.  Chronic A. Fib. Hold Lopressor due to hypotension. Continue amiodarone,  Coumadin pharmacy to dose.  Diabetes. Start sliding scale and continue Lantus 25 units at bedtime.   All the records are reviewed and case discussed with ED provider. Management plans discussed with the patient, family and they are in agreement.  CODE STATUS: Full code TOTAL TIME TAKING CARE OF THIS PATIENT: 53 minutes.    Demetrios Loll M.D on 11/24/2015 at 4:16 PM  Between 7am to 6pm - Pager - [phone]  After 6pm go to [email or web address] - password [email or web address] [place] [facility]  Office  [phone]  CC: Primary care physician; Tracie Harrier, MD

## 2015-11-24 NOTE — Progress Notes (Signed)
Pharmacy Antibiotic Note  Dakota Thomas is a 82 y.o. male admitted on 11/24/2015 with bacteremia. Patient presented to ED 5/10 and was discharged. BCID results from 5/10 showing Enterococcus with no vancomycin resistance.  Pharmacy has been consulted for Zosyn dosing.  Plan: Spoke to Dr. Bridgett Larsson regarding BCID results with enterococcus and no vancomycin resistance in 1/2 blood cultures from 5/10. Dr. Bridgett Larsson wants to treat with Zosyn for now which should cover the Enterococcus as well as ampicillin pending further culture/sensitivity results.   Zosyn 3.375 g iv once in ED then Zosyn 3.375g IV q8h (4 hour infusion) starting 6 hours after initial dose.  Height: 6\' 1"  (185.4 cm) Weight: 202 lb (91.627 kg) IBW/kg (Calculated) : 79.9  Temp (24hrs), Avg:98.4 F (36.9 C), Min:98.4 F (36.9 C), Max:98.4 F (36.9 C)   Recent Labs Lab 11/22/15 1715 11/22/15 1922 11/23/15 0513 11/24/15 1006  WBC 16.3*  --  13.6* 8.9  CREATININE  --  1.18 1.06 0.96    Estimated Creatinine Clearance: 70.5 mL/min (by C-G formula based on Cr of 0.96).    Allergies  Allergen Reactions  . Ciprofloxacin   . Levaquin [Levofloxacin] Swelling    Reaction: swelling of throat   . Tannic Acid Hives    Antimicrobials this admission: Ceftriaxone 5/11 >> x 1 Zosyn 5/11 >>   Dose adjustments this admission:   Microbiology results: 5/10 BCx: Enterococcus (no vanc resistance) in 1/2 5/11 BCx: pending 5/11 UCx: pending    Thank you for allowing pharmacy to be a part of this patient's care.  Ulice Dash D 11/24/2015 4:27 PM

## 2015-11-24 NOTE — Progress Notes (Signed)
BCID Follow up BCID with one bottle growing Enterococcus (vanc resistance not detected).  Spoke with Dr. Lavetta Nielsen, who was the discharging physician, who stated to call pt and see how he is doing. If he has sxs (e.g. fever, chills), then recommend to come back to the hospital but if he is asymptomatic ok to stay at home. Called pt and informed him of this. Pt stated that he still feels tired and not his usual self and will come back to the emergency department for follow up.

## 2015-11-25 LAB — CBC
HCT: 36.4 % — ABNORMAL LOW (ref 40.0–52.0)
Hemoglobin: 12.2 g/dL — ABNORMAL LOW (ref 13.0–18.0)
MCH: 31.2 pg (ref 26.0–34.0)
MCHC: 33.5 g/dL (ref 32.0–36.0)
MCV: 93.1 fL (ref 80.0–100.0)
Platelets: 132 10*3/uL — ABNORMAL LOW (ref 150–440)
RBC: 3.91 MIL/uL — ABNORMAL LOW (ref 4.40–5.90)
RDW: 14.4 % (ref 11.5–14.5)
WBC: 7.2 10*3/uL (ref 3.8–10.6)

## 2015-11-25 LAB — GLUCOSE, CAPILLARY
Glucose-Capillary: 127 mg/dL — ABNORMAL HIGH (ref 65–99)
Glucose-Capillary: 135 mg/dL — ABNORMAL HIGH (ref 65–99)
Glucose-Capillary: 136 mg/dL — ABNORMAL HIGH (ref 65–99)
Glucose-Capillary: 182 mg/dL — ABNORMAL HIGH (ref 65–99)

## 2015-11-25 LAB — BASIC METABOLIC PANEL
Anion gap: 8 (ref 5–15)
BUN: 13 mg/dL (ref 6–20)
CO2: 25 mmol/L (ref 22–32)
Calcium: 8.2 mg/dL — ABNORMAL LOW (ref 8.9–10.3)
Chloride: 104 mmol/L (ref 101–111)
Creatinine, Ser: 0.9 mg/dL (ref 0.61–1.24)
GFR calc Af Amer: >60 mL/min (ref >60)
GFR calc non Af Amer: >60 mL/min (ref >60)
Glucose, Bld: 136 mg/dL — ABNORMAL HIGH (ref 65–99)
Potassium: 4.1 mmol/L (ref 3.5–5.1)
Sodium: 137 mmol/L (ref 135–145)

## 2015-11-25 LAB — PROTIME-INR
INR: 2.78
Prothrombin Time: 28.9 seconds — ABNORMAL HIGH (ref 11.4–15.0)

## 2015-11-25 MED ORDER — SODIUM CHLORIDE 0.9 % IV SOLN
2.0000 g | INTRAVENOUS | Status: DC
  Administered 2015-11-25 – 2015-11-28 (×15): 2 g via INTRAVENOUS
  Filled 2015-11-25 (×22): qty 2000

## 2015-11-25 MED ORDER — HYDROCODONE-ACETAMINOPHEN 5-325 MG PO TABS
1.0000 | ORAL_TABLET | Freq: Four times a day (QID) | ORAL | Status: DC | PRN
  Administered 2015-11-25 – 2015-11-27 (×2): 1 via ORAL
  Filled 2015-11-25 (×2): qty 1

## 2015-11-25 MED ORDER — VANCOMYCIN HCL 10 G IV SOLR
1250.0000 mg | INTRAVENOUS | Status: AC
  Administered 2015-11-25: 1250 mg via INTRAVENOUS
  Filled 2015-11-25: qty 1250

## 2015-11-25 MED ORDER — WARFARIN SODIUM 5 MG PO TABS
2.5000 mg | ORAL_TABLET | Freq: Every day | ORAL | Status: DC
  Administered 2015-11-25: 2.5 mg via ORAL
  Filled 2015-11-25: qty 1

## 2015-11-25 MED ORDER — VANCOMYCIN HCL 10 G IV SOLR
1250.0000 mg | Freq: Two times a day (BID) | INTRAVENOUS | Status: DC
  Administered 2015-11-25 – 2015-11-26 (×2): 1250 mg via INTRAVENOUS
  Filled 2015-11-25 (×3): qty 1250

## 2015-11-25 NOTE — Consult Note (Addendum)
[facility] Infectious Disease     Reason for Consult: enterococcal bacteremia   Referring Physician: Bobetta Lime Date of Admission:  11/24/2015   Active Problems:   Bacteremia   HPI: Dakota Thomas is a 82 y.o. male admitted after bcx turned + for enterococcus in one of 2 bottles done for a fever during recent admission 5/9 with near syncope and orthostatic hypotension.   He does have severe CHF, CAD and has an AICD in place.  He has improved clincially with fluids. He does on further questioning report not feeling himself for the last month and he and his wife report occas chills for 1-2 weeks.  He had a fever to 101.2 at last admission and did not know he had a temperature.  He has had another low grade temp to 100.5 since admission. WBC on initial admission was 16.3 but now down to 7.2  He has a hx of what sounds like BPH and had cyrotherapy in the distant past. HAs been having some R groin discomfort with urination recently but no recent procedures, dental work He had episode of RUQ pain a month ago and had wu at [facility] with CT stone protocol 3/18 which revealed no stones, but did have enlarged prostate.    Past Medical History  Diagnosis Date  . Diabetes mellitus without complication ([place])   . Hypertension   . Heart attack ([place])   . Pacemaker   . Renal disorder   . CAD (coronary artery disease)   . A-fib ([place])   . BPH (benign prostatic hyperplasia)    Past Surgical History  Procedure Laterality Date  . Cholecystectomy    . Pacemaker insertion    . Tonsillectomy    . Coronary artery bypass graft    . Coronary angioplasty with stent placement    . Appendectomy     Social History  Substance Use Topics  . Smoking status: Former Research scientist (life sciences)  . Smokeless tobacco: None  . Alcohol Use: Yes   Family History  Problem Relation Age of Onset  . CAD    . Diabetes    . Hypertension      Allergies:  Allergies  Allergen Reactions  . Ciprofloxacin   . Levaquin [Levofloxacin] Swelling     Reaction: swelling of throat   . Tannic Acid Hives    Current antibiotics: Antibiotics Given (last 72 hours)    Date/Time Action Medication Dose Rate   11/24/15 2140 Given   piperacillin-tazobactam (ZOSYN) IVPB 3.375 g 3.375 g 12.5 mL/hr   11/25/15 0543 Given   piperacillin-tazobactam (ZOSYN) IVPB 3.375 g 3.375 g 12.5 mL/hr      MEDICATIONS: . amiodarone  100 mg Oral Daily  . gabapentin  600 mg Oral TID  . insulin aspart  0-5 Units Subcutaneous QHS  . insulin aspart  0-9 Units Subcutaneous TID WC  . insulin glargine  25 Units Subcutaneous QHS  . levothyroxine  100 mcg Oral QAC breakfast  . omega-3 acid ethyl esters  1,000 mg Oral Daily  . pantoprazole  40 mg Oral Daily  . piperacillin-tazobactam (ZOSYN)  IV  3.375 g Intravenous Q8H  . pyridOXINE  100 mg Oral Daily  . warfarin  2.5 mg Oral q1800  . Warfarin - Pharmacist Dosing Inpatient   Does not apply q1800    Review of Systems - 11 systems reviewed and negative per HPI   OBJECTIVE: Temp:  [97.7 F (36.5 C)-100.5 F (38.1 C)] 97.7 F (36.5 C) (05/12 1220) Pulse Rate:  [  57-70] 57 (05/12 1220) Resp:  [16-20] 16 (05/12 1220) BP: (109-117)/(57-65) 109/57 mmHg (05/12 1220) SpO2:  [92 %-98 %] 97 % (05/12 1220) Physical Exam  Constitutional: He is oriented to person, place, and time. He appears well-developed and well-nourished. No distress.  HENT: [place], eomi Mouth/Throat: Oropharynx is clear and moist. No oropharyngeal exudate.  Cardiovascular: Normal rate, regular rhythm, distant L AICD site with no tenderness, or erythema Pulmonary/Chest: Effort normal and breath sounds normal. No respiratory distress. He has no wheezes.  Abdominal: Soft. Bowel sounds are normal. He exhibits no distension. There is no tenderness.  Lymphadenopathy: He has no cervical adenopathy.  Neurological: He is alert and oriented to person, place, and time.  Skin: Skin is warm and dry. No rash noted. No erythema. Some bruising, no stigmata of  endocarditis Psychiatric: He has a normal mood and affect. His behavior is normal.   LABS: Results for orders placed or performed during the hospital encounter of 11/24/15 (from the past 48 hour(s))  Urinalysis complete, with microscopic     Status: Abnormal   Collection Time: 11/24/15 10:06 AM  Result Value Ref Range   Color, Urine YELLOW (A) YELLOW   APPearance CLEAR (A) CLEAR   Glucose, UA NEGATIVE NEGATIVE mg/dL   Bilirubin Urine NEGATIVE NEGATIVE   Ketones, ur TRACE (A) NEGATIVE mg/dL   Specific Gravity, Urine 1.012 1.005 - 1.030   Hgb urine dipstick NEGATIVE NEGATIVE   pH 6.0 5.0 - 8.0   Protein, ur NEGATIVE NEGATIVE mg/dL   Nitrite NEGATIVE NEGATIVE   Leukocytes, UA NEGATIVE NEGATIVE   RBC / HPF 0-5 0 - 5 RBC/hpf   WBC, UA 0-5 0 - 5 WBC/hpf   Bacteria, UA RARE (A) NONE SEEN   Squamous Epithelial / LPF NONE SEEN NONE SEEN   Mucous PRESENT    Hyaline Casts, UA PRESENT   Urine culture     Status: None (Preliminary result)   Collection Time: 11/24/15 10:06 AM  Result Value Ref Range   Specimen Description URINE, CLEAN CATCH    Special Requests Normal    Culture CULTURE REINCUBATED FOR BETTER GROWTH    Report Status PENDING   CBC with Differential     Status: Abnormal   Collection Time: 11/24/15 10:06 AM  Result Value Ref Range   WBC 8.9 3.8 - 10.6 K/uL   RBC 4.01 (L) 4.40 - 5.90 MIL/uL   Hemoglobin 12.4 (L) 13.0 - 18.0 g/dL   HCT 36.5 (L) 40.0 - 52.0 %   MCV 90.9 80.0 - 100.0 fL   MCH 30.9 26.0 - 34.0 pg   MCHC 34.0 32.0 - 36.0 g/dL   RDW 14.5 11.5 - 14.5 %   Platelets 121 (L) 150 - 440 K/uL   Neutrophils Relative % 70% %   Neutro Abs 6.3 1.4 - 6.5 K/uL   Lymphocytes Relative 16% %   Lymphs Abs 1.4 1.0 - 3.6 K/uL   Monocytes Relative 13% %   Monocytes Absolute 1.2 (H) 0.2 - 1.0 K/uL   Eosinophils Relative 1% %   Eosinophils Absolute 0.1 0 - 0.7 K/uL   Basophils Relative 0% %   Basophils Absolute 0.0 0 - 0.1 K/uL  Comprehensive metabolic panel     Status:  Abnormal   Collection Time: 11/24/15 10:06 AM  Result Value Ref Range   Sodium 134 (L) 135 - 145 mmol/L   Potassium 3.4 (L) 3.5 - 5.1 mmol/L   Chloride 102 101 - 111 mmol/L   CO2 24 22 -  32 mmol/L   Glucose, Bld 181 (H) 65 - 99 mg/dL   BUN 16 6 - 20 mg/dL   Creatinine, Ser 0.96 0.61 - 1.24 mg/dL   Calcium 8.5 (L) 8.9 - 10.3 mg/dL   Total Protein 6.8 6.5 - 8.1 g/dL   Albumin 3.3 (L) 3.5 - 5.0 g/dL   AST 19 15 - 41 U/L   ALT 14 (L) 17 - 63 U/L   Alkaline Phosphatase 48 38 - 126 U/L   Total Bilirubin 1.4 (H) 0.3 - 1.2 mg/dL   GFR calc non Af Amer >60 >60 mL/min   GFR calc Af Amer >60 >60 mL/min    Comment: (NOTE) The eGFR has been calculated using the CKD EPI equation. This calculation has not been validated in all clinical situations. eGFR's persistently <60 mL/min signify possible Chronic Kidney Disease.    Anion gap 8 5 - 15  Protime-INR     Status: Abnormal   Collection Time: 11/24/15 10:06 AM  Result Value Ref Range   Prothrombin Time 26.3 (H) 11.4 - 15.0 seconds   INR 2.45   Magnesium     Status: None   Collection Time: 11/24/15 10:06 AM  Result Value Ref Range   Magnesium 1.8 1.7 - 2.4 mg/dL  Glucose, capillary     Status: Abnormal   Collection Time: 11/24/15  5:02 PM  Result Value Ref Range   Glucose-Capillary 110 (H) 65 - 99 mg/dL  Glucose, capillary     Status: Abnormal   Collection Time: 11/24/15  8:49 PM  Result Value Ref Range   Glucose-Capillary 197 (H) 65 - 99 mg/dL  Basic metabolic panel     Status: Abnormal   Collection Time: 11/25/15  4:54 AM  Result Value Ref Range   Sodium 137 135 - 145 mmol/L   Potassium 4.1 3.5 - 5.1 mmol/L   Chloride 104 101 - 111 mmol/L   CO2 25 22 - 32 mmol/L   Glucose, Bld 136 (H) 65 - 99 mg/dL   BUN 13 6 - 20 mg/dL   Creatinine, Ser 0.90 0.61 - 1.24 mg/dL   Calcium 8.2 (L) 8.9 - 10.3 mg/dL   GFR calc non Af Amer >60 >60 mL/min   GFR calc Af Amer >60 >60 mL/min    Comment: (NOTE) The eGFR has been calculated using the  CKD EPI equation. This calculation has not been validated in all clinical situations. eGFR's persistently <60 mL/min signify possible Chronic Kidney Disease.    Anion gap 8 5 - 15  CBC     Status: Abnormal   Collection Time: 11/25/15  4:54 AM  Result Value Ref Range   WBC 7.2 3.8 - 10.6 K/uL   RBC 3.91 (L) 4.40 - 5.90 MIL/uL   Hemoglobin 12.2 (L) 13.0 - 18.0 g/dL   HCT 36.4 (L) 40.0 - 52.0 %   MCV 93.1 80.0 - 100.0 fL   MCH 31.2 26.0 - 34.0 pg   MCHC 33.5 32.0 - 36.0 g/dL   RDW 14.4 11.5 - 14.5 %   Platelets 132 (L) 150 - 440 K/uL  Protime-INR     Status: Abnormal   Collection Time: 11/25/15  4:54 AM  Result Value Ref Range   Prothrombin Time 28.9 (H) 11.4 - 15.0 seconds   INR 2.78   Glucose, capillary     Status: Abnormal   Collection Time: 11/25/15  7:32 AM  Result Value Ref Range   Glucose-Capillary 127 (H) 65 - 99 mg/dL   Comment  1 Notify RN   Glucose, capillary     Status: Abnormal   Collection Time: 11/25/15 11:23 AM  Result Value Ref Range   Glucose-Capillary 135 (H) 65 - 99 mg/dL   Comment 1 Notify RN    No components found for: ESR, C REACTIVE PROTEIN MICRO: Recent Results (from the past 720 hour(s))  CULTURE, BLOOD (ROUTINE X 2) w Reflex to PCR ID Panel     Status: None (Preliminary result)   Collection Time: 11/23/15  9:03 AM  Result Value Ref Range Status   Specimen Description BLOOD LEFT ASSIST CONTROL  Final   Special Requests   Final    BOTTLES DRAWN AEROBIC AND ANAEROBIC  ANAEROBIC 8CC, AEROBIC 12CC   Culture NO GROWTH 1 DAY  Final   Report Status PENDING  Incomplete  CULTURE, BLOOD (ROUTINE X 2) w Reflex to PCR ID Panel     Status: Abnormal (Preliminary result)   Collection Time: 11/23/15  9:08 AM  Result Value Ref Range Status   Specimen Description BLOOD RIGHT ASSIST CONTROL  Final   Special Requests BOTTLES DRAWN AEROBIC AND ANAEROBIC  [place]  Final   Culture  Setup Time   Final    GRAM POSITIVE COCCI AEROBIC BOTTLE ONLY CRITICAL RESULT CALLED TO,  READ BACK BY AND VERIFIED WITH: [place] ON 11/24/15 AT 0531 BY TLB CONFIRMED BY TLB/CAF    Culture (A)  Final    ENTEROCOCCUS SPECIES AEROBIC BOTTLE ONLY SUSCEPTIBILITIES TO FOLLOW    Report Status PENDING  Incomplete  Blood Culture ID Panel (Reflexed)     Status: Abnormal   Collection Time: 11/23/15  9:08 AM  Result Value Ref Range Status   Enterococcus species DETECTED (A) NOT DETECTED Final    Comment: CRITICAL RESULT CALLED TO, READ BACK BY AND VERIFIED WITH: Nate Cookson at 0531 11/24/15 TLB    Vancomycin resistance NOT DETECTED NOT DETECTED Final   Listeria monocytogenes NOT DETECTED NOT DETECTED Final   Staphylococcus species NOT DETECTED NOT DETECTED Final   Staphylococcus aureus NOT DETECTED NOT DETECTED Final   Methicillin resistance NOT DETECTED NOT DETECTED Final   Streptococcus species NOT DETECTED NOT DETECTED Final   Streptococcus agalactiae NOT DETECTED NOT DETECTED Final   Streptococcus pneumoniae NOT DETECTED NOT DETECTED Final   Streptococcus pyogenes NOT DETECTED NOT DETECTED Final   Acinetobacter baumannii NOT DETECTED NOT DETECTED Final   Enterobacteriaceae species NOT DETECTED NOT DETECTED Final   Enterobacter cloacae complex NOT DETECTED NOT DETECTED Final   Escherichia coli NOT DETECTED NOT DETECTED Final   Klebsiella oxytoca NOT DETECTED NOT DETECTED Final   Klebsiella pneumoniae NOT DETECTED NOT DETECTED Final   Proteus species NOT DETECTED NOT DETECTED Final   Serratia marcescens NOT DETECTED NOT DETECTED Final   Carbapenem resistance NOT DETECTED NOT DETECTED Final   Haemophilus influenzae NOT DETECTED NOT DETECTED Final   Neisseria meningitidis NOT DETECTED NOT DETECTED Final   Pseudomonas aeruginosa NOT DETECTED NOT DETECTED Final   Candida albicans NOT DETECTED NOT DETECTED Final   Candida glabrata NOT DETECTED NOT DETECTED Final   Candida krusei NOT DETECTED NOT DETECTED Final   Candida parapsilosis NOT DETECTED NOT DETECTED Final   Candida  tropicalis NOT DETECTED NOT DETECTED Final  Urine culture     Status: None (Preliminary result)   Collection Time: 11/24/15 10:06 AM  Result Value Ref Range Status   Specimen Description URINE, CLEAN CATCH  Final   Special Requests Normal  Final   Culture CULTURE REINCUBATED FOR  BETTER GROWTH  Final   Report Status PENDING  Incomplete    IMAGING: Dg Chest 2 View  11/24/2015  CLINICAL DATA:  Cough with leukocytosis. History of diabetes and hypertension. EXAM: CHEST  2 VIEW COMPARISON:  07/02/2015 and 06/21/2015. FINDINGS: The left subclavian AICD leads appear unchanged. The heart size and mediastinal contours are stable status post CABG. There is interval improved aeration of the lungs which are now clear. There is mild chronic central airway thickening. There is a stable calcified left lower lobe granuloma. No pleural effusion or pneumothorax. Mild degenerative changes throughout the spine are stable. IMPRESSION: No acute cardiopulmonary process. Electronically Signed   By: Richardean Sale M.D.   On: 11/24/2015 10:40    Assessment:   JESIAH GRISMER is a 82 y.o. male with enterococcal bacteremia likely from a urinary source.  I spoke with micro and it does appear enterococcus is also growing in his urine culture and he has BPH on CT scan and having urinary symptoms of discomfort and frequency I am very concerned for possible seeding of his AICD wires or endocarditis since he has has had several weeks of feeling poorer than usual and has had a week or 2 of intermittent chills.   Recommendations Repeat cx is pending from blood and urine Would continue inpatient status and IV abx until TEE done Add back vanco until sensitivities available. Change zosyn to ampicillin - I have ordered both of these. If over weekend the organism is sensitive to ampicillin I would change to just ampicillin. If repeat BCX is positive over the weekend please add synergistic gentimicin  I spoke with Dr Ubaldo Glassing in  cardiology and will do on Monday  a TEE  If vegetation noted would need consideration of removal of AICD wires and would need the addition of gent to his abx regimen.   Consider starting BPH meds given sxs and enlargement on CT scan  This was a high level consult and I discussed the case with cardiology, and with hospitalist. Discussed the above recs extensively with patient and his wife Thank you very much for allowing me to participate in the care of this patient. Please call with questions.   Cheral Marker. Ola Spurr, MD

## 2015-11-25 NOTE — Progress Notes (Signed)
ANTICOAGULATION CONSULT NOTE - Initial Consult  Pharmacy Consult for Coumadin Indication: atrial fibrillation  Allergies  Allergen Reactions  . Ciprofloxacin   . Levaquin [Levofloxacin] Swelling    Reaction: swelling of throat   . Tannic Acid Hives    Patient Measurements: Height: 6\' 1"  (185.4 cm) Weight: 202 lb (91.627 kg) IBW/kg (Calculated) : 79.9  Vital Signs: Temp: 98.2 F (36.8 C) (05/12 0539) Temp Source: Oral (05/12 0539) BP: 109/63 mmHg (05/12 0539) Pulse Rate: 66 (05/12 0539)  Labs:  Recent Labs  11/22/15 1922 11/22/15 2325 11/23/15 0513 11/23/15 1138 11/24/15 1006 11/25/15 0454  HGB  --   --  13.4  --  12.4* 12.2*  HCT  --   --  40.1  --  36.5* 36.4*  PLT  --   --  108*  --  121* 132*  LABPROT  --   --   --   --  26.3* 28.9*  INR  --   --   --   --  2.45 2.78  CREATININE 1.18  --  1.06  --  0.96 0.90  CKTOTAL 226  --   --   --   --   --   TROPONINI 0.03 0.04* 0.03 0.03  --   --     Estimated Creatinine Clearance: 75.2 mL/min (by C-G formula based on Cr of 0.9).   Medical History: Past Medical History  Diagnosis Date  . Diabetes mellitus without complication ([place])   . Hypertension   . Heart attack ([place])   . Pacemaker   . Renal disorder   . CAD (coronary artery disease)   . A-fib ([place])   . BPH (benign prostatic hyperplasia)     Assessment: Pharmacy consulted to dose warfarin for 82 yo male with history significant for atrial fibrillation. Patient takes warfarin 5mg  on Sunday/Tuesday/Wednesday/Friday/Saturday and warfarin 2.5mg  on Monday/Thursday. Patient is currently receiving Zosyn which has the potential to effect INR.   Goal of Therapy:  INR 2-3  Plan:  Patient received warfarin 2.5mg  on 5/11. Will order warfarin 2.5mg  on 5/12 and obtain INR with am labs.   Pharmacy will continue to monitor and adjust per consult.    Nghia Mcentee L 11/25/2015,8:38 AM

## 2015-11-25 NOTE — Progress Notes (Signed)
Pharmacy Antibiotic Note  Dakota Thomas is a 82 y.o. male admitted on 11/24/2015 with bacteremia. Patient presented to ED 5/10 and was discharged. BCID results from 5/10 showing Enterococcus with no vancomycin resistance.  Pharmacy has been consulted for Zosyn dosing. Zosyn d/c per ID with plans to use ampicillin and vancomycin due to concern for enterococcal endocarditis. Plan is to d/c vancomycin if organism is sensitive to ampicillin. If repeat BCx is positive, will need gentamicin synergy per ID.   Ke: 0.07 h-1  Vd: 64.1 L  T1/2: 9.9 h   Plan: Vancomycin 1250 mg iv q 12 hours with stacked dosing and a trough with the 5th total dose. Goal trough 15-20 mcg/ml.   Height: 6\' 1"  (185.4 cm) Weight: 202 lb (91.627 kg) IBW/kg (Calculated) : 79.9  Temp (24hrs), Avg:98.8 F (37.1 C), Min:97.7 F (36.5 C), Max:100.5 F (38.1 C)   Recent Labs Lab 11/22/15 1715 11/22/15 1922 11/23/15 0513 11/24/15 1006 11/25/15 0454  WBC 16.3*  --  13.6* 8.9 7.2  CREATININE  --  1.18 1.06 0.96 0.90    Estimated Creatinine Clearance: 75.2 mL/min (by C-G formula based on Cr of 0.9).    Allergies  Allergen Reactions  . Ciprofloxacin   . Levaquin [Levofloxacin] Swelling    Reaction: swelling of throat   . Tannic Acid Hives    Antimicrobials this admission: Ceftriaxone 5/11 >> x 1 Zosyn 5/11 >> 5/12 Ampicillin 5/12 >>  Vancomycin 5/12 >>  Dose adjustments this admission:   Microbiology results: 5/10 BCx: Enterococcus (no vanc resistance) in 1/2 5/11 BCx: NGTD 5/11 UCx: pending    Thank you for allowing pharmacy to be a part of this patient's care.  Ulice Dash D 11/25/2015 3:12 PM

## 2015-11-25 NOTE — Progress Notes (Signed)
[facility] [place] NAME: Dakota Thomas    MR#:  [id number]  DATE OF BIRTH:  Jun 20, 1934  SUBJECTIVE:   Patient recently discharged from the hospital due to syncopal symptoms. He returned back to the hospital his blood cultures were positive for enterococcus. Patient presently complains of some dysuria and frequency. No fevers. No other complaints presently.  REVIEW OF SYSTEMS:    Review of Systems  Constitutional: Negative for fever and chills.  HENT: Negative for congestion and tinnitus.   Eyes: Negative for blurred vision and double vision.  Respiratory: Negative for cough, shortness of breath and wheezing.   Cardiovascular: Negative for chest pain, orthopnea and PND.  Gastrointestinal: Negative for nausea, vomiting, abdominal pain and diarrhea.  Genitourinary: Positive for dysuria and frequency. Negative for hematuria.  Neurological: Negative for dizziness, sensory change and focal weakness.  All other systems reviewed and are negative.   Nutrition: Heart Healthy/Carb control.  Tolerating Diet: Yes Tolerating PT: Ambulatory   DRUG ALLERGIES:   Allergies  Allergen Reactions  . Ciprofloxacin   . Levaquin [Levofloxacin] Swelling    Reaction: swelling of throat   . Tannic Acid Hives    VITALS:  Blood pressure 109/57, pulse 57, temperature 97.7 F (36.5 C), temperature source Oral, resp. rate 16, height 6\' 1"  (1.854 m), weight 91.627 kg (202 lb), SpO2 97 %.  PHYSICAL EXAMINATION:   Physical Exam  GENERAL:  82 y.o.-year-old patient lying in the bed with no acute distress.  EYES: Pupils equal, round, reactive to light and accommodation. No scleral icterus. Extraocular muscles intact.  HEENT: Head atraumatic, normocephalic. Oropharynx and nasopharynx clear.  NECK:  Supple, no jugular venous distention. No thyroid enlargement, no tenderness.  LUNGS: Normal breath sounds bilaterally, no wheezing, rales, rhonchi. No use of  accessory muscles of respiration.  CARDIOVASCULAR: S1, S2 normal. No murmurs, rubs, or gallops.  ABDOMEN: Soft, nontender, nondistended. Bowel sounds present. No organomegaly or mass.  EXTREMITIES: No cyanosis, clubbing or edema b/l.    NEUROLOGIC: Cranial nerves II through XII are intact. No focal Motor or sensory deficits b/l.   PSYCHIATRIC: The patient is alert and oriented x 3.  SKIN: No obvious rash, lesion, or ulcer.    LABORATORY PANEL:   CBC  Recent Labs Lab 11/25/15 0454  WBC 7.2  HGB 12.2*  HCT 36.4*  PLT 132*   ------------------------------------------------------------------------------------------------------------------  Chemistries   Recent Labs Lab 11/24/15 1006 11/25/15 0454  NA 134* 137  K 3.4* 4.1  CL 102 104  CO2 24 25  GLUCOSE 181* 136*  BUN 16 13  CREATININE 0.96 0.90  CALCIUM 8.5* 8.2*  MG 1.8  --   AST 19  --   ALT 14*  --   ALKPHOS 48  --   BILITOT 1.4*  --    ------------------------------------------------------------------------------------------------------------------  Cardiac Enzymes  Recent Labs Lab 11/23/15 1138  TROPONINI 0.03   ------------------------------------------------------------------------------------------------------------------  RADIOLOGY:  Dg Chest 2 View  11/24/2015  CLINICAL DATA:  Cough with leukocytosis. History of diabetes and hypertension. EXAM: CHEST  2 VIEW COMPARISON:  07/02/2015 and 06/21/2015. FINDINGS: The left subclavian AICD leads appear unchanged. The heart size and mediastinal contours are stable status post CABG. There is interval improved aeration of the lungs which are now clear. There is mild chronic central airway thickening. There is a stable calcified left lower lobe granuloma. No pleural effusion or pneumothorax. Mild degenerative changes throughout the spine are stable. IMPRESSION: No acute cardiopulmonary process. Electronically Signed  By: Richardean Sale M.D.   On: 11/24/2015 10:40      ASSESSMENT AND PLAN:   82 year old male with past medical history of diabetes, hypertension, history of AICD, chronic atrial fibrillation, BPH, coronary artery disease who presents to the hospital due to positive blood cultures.  1. Bacteremia-patient's blood cultures are positive for enterococcus. Source is unclear but suspected to be urinary in nature given his dysuria and frequency. -Appreciate infectious disease input and they're concerned about possible endocarditis given his prolonged symptoms of chills and fever and not feeling well. Plan for doing a TEE on Monday. -Antibiotics changed to IV vancomycin, ampicillin. Repeat blood cultures so far negative. -Follow fever curve and follow clinically.  2. History of atrial fibrillation-currently rate controlled.  -continue amiodarone. Continue warfarin.  3. Hypothyroidism-continue Synthroid.  4. Type 2 diabetes without complication-continue Lantus, sliding scale insulin.  5. Chronic diarrhea-continue as needed Imodium.  6. Diabetic neuropathy-continue gabapentin.     All the records are reviewed and case discussed with Care Management/Social Workerr. Management plans discussed with the patient, family and they are in agreement.  CODE STATUS: Full  DVT Prophylaxis: Coumadin  TOTAL TIME TAKING CARE OF THIS PATIENT: 30 minutes.   POSSIBLE D/C IN 2-3 DAYS, DEPENDING ON CLINICAL CONDITION.   Henreitta Leber M.D on 11/25/2015 at 2:56 PM  Between 7am to 6pm - Pager - [phone]  After 6pm go to [email or web address] - password [email or web address] [facility]  Office  [phone]  CC: Primary care physician; Tracie Harrier, MD

## 2015-11-25 NOTE — Progress Notes (Signed)
Called Dr. Verdell Carmine to get an order for Norco.  He gave a verbal order over the phone.

## 2015-11-26 LAB — GLUCOSE, CAPILLARY
Glucose-Capillary: 123 mg/dL — ABNORMAL HIGH (ref 65–99)
Glucose-Capillary: 197 mg/dL — ABNORMAL HIGH (ref 65–99)
Glucose-Capillary: 122 mg/dL — ABNORMAL HIGH (ref 65–99)
Glucose-Capillary: 172 mg/dL — ABNORMAL HIGH (ref 65–99)

## 2015-11-26 LAB — BASIC METABOLIC PANEL
Anion gap: 3 — ABNORMAL LOW (ref 5–15)
BUN: 10 mg/dL (ref 6–20)
CO2: 29 mmol/L (ref 22–32)
Calcium: 8.4 mg/dL — ABNORMAL LOW (ref 8.9–10.3)
Chloride: 107 mmol/L (ref 101–111)
Creatinine, Ser: 0.78 mg/dL (ref 0.61–1.24)
GFR calc Af Amer: >60 mL/min (ref >60)
GFR calc non Af Amer: >60 mL/min (ref >60)
Glucose, Bld: 160 mg/dL — ABNORMAL HIGH (ref 65–99)
Potassium: 3.9 mmol/L (ref 3.5–5.1)
Sodium: 139 mmol/L (ref 135–145)

## 2015-11-26 LAB — URINE CULTURE
Culture: >=100000 — AB
Special Requests: NORMAL

## 2015-11-26 LAB — PROTIME-INR
INR: 3.12
Prothrombin Time: 31.5 seconds — ABNORMAL HIGH (ref 11.4–15.0)

## 2015-11-26 LAB — CBC
HCT: 36.9 % — ABNORMAL LOW (ref 40.0–52.0)
Hemoglobin: 12.3 g/dL — ABNORMAL LOW (ref 13.0–18.0)
MCH: 31.2 pg (ref 26.0–34.0)
MCHC: 33.4 g/dL (ref 32.0–36.0)
MCV: 93.3 fL (ref 80.0–100.0)
Platelets: 136 10*3/uL — ABNORMAL LOW (ref 150–440)
RBC: 3.96 MIL/uL — ABNORMAL LOW (ref 4.40–5.90)
RDW: 14.7 % — ABNORMAL HIGH (ref 11.5–14.5)
WBC: 6.2 10*3/uL (ref 3.8–10.6)

## 2015-11-26 MED ORDER — TAMSULOSIN HCL 0.4 MG PO CAPS
0.4000 mg | ORAL_CAPSULE | Freq: Every day | ORAL | Status: DC
  Administered 2015-11-26 – 2015-11-27 (×2): 0.4 mg via ORAL
  Filled 2015-11-26 (×2): qty 1

## 2015-11-26 MED ORDER — WARFARIN SODIUM 2 MG PO TABS
2.0000 mg | ORAL_TABLET | Freq: Every day | ORAL | Status: DC
  Administered 2015-11-26 – 2015-11-27 (×2): 2 mg via ORAL
  Filled 2015-11-26 (×2): qty 1

## 2015-11-26 NOTE — Progress Notes (Signed)
Patient ID: Dakota Thomas, male   DOB: Apr 16, 1934, 82 y.o.   MRN: [id number] [facility] PROGRESS NOTE  Dakota Thomas [id number] DOB: 04/26/1934 DOA: 11/24/2015 PCP: Tracie Harrier, MD  HPI/Subjective: Patient feeling fine. Urinating quite a bit. Painful in the abdomen with urination.  Objective: Filed Vitals:   11/26/15 0406 11/26/15 1306  BP: 133/74 116/69  Pulse: 64 63  Temp: 98.3 F (36.8 C) 98.2 F (36.8 C)  Resp: 18 16    Filed Weights   11/24/15 0923  Weight: 91.627 kg (202 lb)    ROS: Review of Systems  Constitutional: Negative for fever and chills.  Eyes: Negative for blurred vision.  Respiratory: Negative for cough and shortness of breath.   Cardiovascular: Negative for chest pain.  Gastrointestinal: Positive for abdominal pain. Negative for nausea, vomiting, diarrhea and constipation.  Genitourinary: Positive for dysuria.  Musculoskeletal: Negative for joint pain.  Neurological: Negative for dizziness and headaches.   Exam: Physical Exam  Constitutional: He is oriented to person, place, and time.  HENT:  Nose: No mucosal edema.  Mouth/Throat: No oropharyngeal exudate or posterior oropharyngeal edema.  Eyes: Conjunctivae, EOM and lids are normal. Pupils are equal, round, and reactive to light.  Neck: No JVD present. Carotid bruit is not present. No edema present. No thyroid mass and no thyromegaly present.  Cardiovascular: S1 normal and S2 normal.  Exam reveals no gallop.   No murmur heard. Pulses:      Dorsalis pedis pulses are 2+ on the right side, and 2+ on the left side.  Respiratory: No respiratory distress. He has no wheezes. He has no rhonchi. He has no rales.  GI: Soft. Bowel sounds are normal. There is tenderness in the right lower quadrant.  Musculoskeletal:       Right ankle: He exhibits swelling.       Left ankle: He exhibits swelling.  Lymphadenopathy:    He has no cervical adenopathy.  Neurological: He is alert and oriented to  person, place, and time. No cranial nerve deficit.  Skin: Skin is warm. No rash noted. Nails show no clubbing.  Psychiatric: He has a normal mood and affect.      Data Reviewed: Basic Metabolic Panel:  Recent Labs Lab 11/22/15 1922 11/23/15 0513 11/24/15 1006 11/25/15 0454 11/26/15 0441  NA 134* 135 134* 137 139  K 4.2 4.4 3.4* 4.1 3.9  CL 100* 101 102 104 107  CO2 26 27 24 25 29   GLUCOSE 111* 127* 181* 136* 160*  BUN 17 16 16 13 10   CREATININE 1.18 1.06 0.96 0.90 0.78  CALCIUM 8.8* 8.7* 8.5* 8.2* 8.4*  MG 1.8  --  1.8  --   --    Liver Function Tests:  Recent Labs Lab 11/22/15 1922 11/24/15 1006  AST 25 19  ALT 14* 14*  ALKPHOS 55 48  BILITOT 1.9* 1.4*  PROT 7.2 6.8  ALBUMIN 3.8 3.3*    Recent Labs Lab 11/22/15 1922  LIPASE 17   CBC:  Recent Labs Lab 11/22/15 1715 11/23/15 0513 11/24/15 1006 11/25/15 0454 11/26/15 0441  WBC 16.3* 13.6* 8.9 7.2 6.2  NEUTROABS 12.5*  --  6.3  --   --   HGB 13.8 13.4 12.4* 12.2* 12.3*  HCT 41.4 40.1 36.5* 36.4* 36.9*  MCV 92.8 91.7 90.9 93.1 93.3  PLT 120* 108* 121* 132* 136*   Cardiac Enzymes:  Recent Labs Lab 11/22/15 1922 11/22/15 2325 11/23/15 0513 11/23/15 1138  CKTOTAL 226  --   --   --  TROPONINI 0.03 0.04* 0.03 0.03    CBG:  Recent Labs Lab 11/25/15 1123 11/25/15 1633 11/25/15 2105 11/26/15 0739 11/26/15 1119  GLUCAP 135* 136* 182* 123* 197*    Recent Results (from the past 240 hour(s))  CULTURE, BLOOD (ROUTINE X 2) w Reflex to PCR ID Panel     Status: None (Preliminary result)   Collection Time: 11/23/15  9:03 AM  Result Value Ref Range Status   Specimen Description BLOOD LEFT ASSIST CONTROL  Final   Special Requests   Final    BOTTLES DRAWN AEROBIC AND ANAEROBIC  ANAEROBIC 8CC, AEROBIC 12CC   Culture NO GROWTH 3 DAYS  Final   Report Status PENDING  Incomplete  CULTURE, BLOOD (ROUTINE X 2) w Reflex to PCR ID Panel     Status: Abnormal (Preliminary result)   Collection Time:  11/23/15  9:08 AM  Result Value Ref Range Status   Specimen Description BLOOD RIGHT ASSIST CONTROL  Final   Special Requests BOTTLES DRAWN AEROBIC AND ANAEROBIC  [place]  Final   Culture  Setup Time   Final    GRAM POSITIVE COCCI AEROBIC BOTTLE ONLY CRITICAL RESULT CALLED TO, READ BACK BY AND VERIFIED WITH: NATE COOKSON ON 11/24/15 AT 0531 BY TLB CONFIRMED BY TLB/CAF    Culture ENTEROCOCCUS FAECALIS AEROBIC BOTTLE ONLY  (A)  Final   Report Status PENDING  Incomplete   Organism ID, Bacteria ENTEROCOCCUS FAECALIS  Final      Susceptibility   Enterococcus faecalis - MIC*    AMPICILLIN <=2 SENSITIVE Sensitive     VANCOMYCIN 1 SENSITIVE Sensitive     GENTAMICIN SYNERGY SENSITIVE Sensitive     LINEZOLID 2 SENSITIVE Sensitive     * ENTEROCOCCUS FAECALIS  Blood Culture ID Panel (Reflexed)     Status: Abnormal   Collection Time: 11/23/15  9:08 AM  Result Value Ref Range Status   Enterococcus species DETECTED (A) NOT DETECTED Final    Comment: CRITICAL RESULT CALLED TO, READ BACK BY AND VERIFIED WITH: Nate Cookson at 0531 11/24/15 TLB    Vancomycin resistance NOT DETECTED NOT DETECTED Final   Listeria monocytogenes NOT DETECTED NOT DETECTED Final   Staphylococcus species NOT DETECTED NOT DETECTED Final   Staphylococcus aureus NOT DETECTED NOT DETECTED Final   Methicillin resistance NOT DETECTED NOT DETECTED Final   Streptococcus species NOT DETECTED NOT DETECTED Final   Streptococcus agalactiae NOT DETECTED NOT DETECTED Final   Streptococcus pneumoniae NOT DETECTED NOT DETECTED Final   Streptococcus pyogenes NOT DETECTED NOT DETECTED Final   Acinetobacter baumannii NOT DETECTED NOT DETECTED Final   Enterobacteriaceae species NOT DETECTED NOT DETECTED Final   Enterobacter cloacae complex NOT DETECTED NOT DETECTED Final   Escherichia coli NOT DETECTED NOT DETECTED Final   Klebsiella oxytoca NOT DETECTED NOT DETECTED Final   Klebsiella pneumoniae NOT DETECTED NOT DETECTED Final   Proteus  species NOT DETECTED NOT DETECTED Final   Serratia marcescens NOT DETECTED NOT DETECTED Final   Carbapenem resistance NOT DETECTED NOT DETECTED Final   Haemophilus influenzae NOT DETECTED NOT DETECTED Final   Neisseria meningitidis NOT DETECTED NOT DETECTED Final   Pseudomonas aeruginosa NOT DETECTED NOT DETECTED Final   Candida albicans NOT DETECTED NOT DETECTED Final   Candida glabrata NOT DETECTED NOT DETECTED Final   Candida krusei NOT DETECTED NOT DETECTED Final   Candida parapsilosis NOT DETECTED NOT DETECTED Final   Candida tropicalis NOT DETECTED NOT DETECTED Final  Urine culture     Status: Abnormal   Collection  Time: 11/24/15 10:06 AM  Result Value Ref Range Status   Specimen Description URINE, CLEAN CATCH  Final   Special Requests Normal  Final   Culture >=100,000 COLONIES/mL ENTEROCOCCUS FAECALIS (A)  Final   Report Status 11/26/2015 FINAL  Final   Organism ID, Bacteria ENTEROCOCCUS FAECALIS (A)  Final      Susceptibility   Enterococcus faecalis - MIC*    AMPICILLIN <=2 SENSITIVE Sensitive     LEVOFLOXACIN 1 SENSITIVE Sensitive     NITROFURANTOIN <=16 SENSITIVE Sensitive     VANCOMYCIN 1 SENSITIVE Sensitive     * >=100,000 COLONIES/mL ENTEROCOCCUS FAECALIS  Culture, blood (routine x 2)     Status: None (Preliminary result)   Collection Time: 11/24/15 10:06 AM  Result Value Ref Range Status   Specimen Description BLOOD RIGHT HAND  Final   Special Requests BOTTLES DRAWN AEROBIC AND ANAEROBIC 7ML  Final   Culture NO GROWTH 2 DAYS  Final   Report Status PENDING  Incomplete  Culture, blood (routine x 2)     Status: None (Preliminary result)   Collection Time: 11/24/15 10:45 AM  Result Value Ref Range Status   Specimen Description BLOOD LEFT AC  Final   Special Requests   Final    BOTTLES DRAWN AEROBIC AND ANAEROBIC AER 6ML ANA 5ML   Culture NO GROWTH 2 DAYS  Final   Report Status PENDING  Incomplete     Scheduled Meds: . amiodarone  100 mg Oral Daily  . ampicillin  (OMNIPEN) IV  2 g Intravenous Q4H  . gabapentin  600 mg Oral TID  . insulin aspart  0-5 Units Subcutaneous QHS  . insulin aspart  0-9 Units Subcutaneous TID WC  . insulin glargine  25 Units Subcutaneous QHS  . levothyroxine  100 mcg Oral QAC breakfast  . omega-3 acid ethyl esters  1,000 mg Oral Daily  . pantoprazole  40 mg Oral Daily  . pyridOXINE  100 mg Oral Daily  . vancomycin  1,250 mg Intravenous Q12H  . warfarin  2 mg Oral q1800  . Warfarin - Pharmacist Dosing Inpatient   Does not apply q1800   Continuous Infusions: . sodium chloride 50 mL/hr at 11/25/15 1225    Assessment/Plan:  1. Bacteremia with enterococcus. Likely urine source. IV antibiotics until TEE done. Can get rid of the vancomycin since the enterococcus is sensitive to ampicillin.  2. Urinary frequency and lower abdominal pain. I will start low-dose Flomax  3. Atrial fibrillation on amiodarone and warfarin 4. Hypothyroidism unspecified on Synthroid 5. Type 2 diabetes without complication on Lantus and sliding scale insulin 6. Chronic diarrhea. As needed Imodium 7. Diabetic neuropathy on gabapentin  Code Status:     Code Status Orders        Start     Ordered   11/24/15 1624  Full code   Continuous     11/24/15 1623    Code Status History    Date Active Date Inactive Code Status Order ID Comments User Context   11/22/2015 11:21 PM 11/23/2015  5:31 PM Full Code [id number]  Lance Coon, MD Inpatient    Advance Directive Documentation        Most Recent Value   Type of Advance Directive  Healthcare Power of Attorney, Living will   Pre-existing out of facility DNR order (yellow form or pink MOST form)     "MOST" Form in Place?       Family Communication: Wife at bedside Disposition Plan: Awaiting TEE on  Monday and then further recommendations by infectious disease on antibiotic therapy  Consultants:  Infectious disease  Cardiology  Antibiotics:  Ampicillin  Time spent: 25 minutes  Loletha Grayer  [employer]

## 2015-11-26 NOTE — Progress Notes (Signed)
ANTICOAGULATION CONSULT NOTE -Follow Up Consult  Pharmacy Consult for Coumadin Indication: atrial fibrillation  Allergies  Allergen Reactions  . Ciprofloxacin   . Levaquin [Levofloxacin] Swelling    Reaction: swelling of throat   . Tannic Acid Hives    Patient Measurements: Height: 6\' 1"  (185.4 cm) Weight: 202 lb (91.627 kg) IBW/kg (Calculated) : 79.9  Vital Signs: Temp: 98.3 F (36.8 C) (05/13 0406) Temp Source: Oral (05/13 0406) BP: 133/74 mmHg (05/13 0406) Pulse Rate: 64 (05/13 0406)  Labs:  Recent Labs  11/23/15 1138  11/24/15 1006 11/25/15 0454 11/26/15 0441  HGB  --   < > 12.4* 12.2* 12.3*  HCT  --   --  36.5* 36.4* 36.9*  PLT  --   --  121* 132* 136*  LABPROT  --   --  26.3* 28.9* 31.5*  INR  --   --  2.45 2.78 3.12  CREATININE  --   --  0.96 0.90 0.78  TROPONINI 0.03  --   --   --   --   < > = values in this interval not displayed.  Estimated Creatinine Clearance: 84.6 mL/min (by C-G formula based on Cr of 0.78).   Medical History: Past Medical History  Diagnosis Date  . Diabetes mellitus without complication ([place])   . Hypertension   . Heart attack ([place])   . Pacemaker   . Renal disorder   . CAD (coronary artery disease)   . A-fib ([place])   . BPH (benign prostatic hyperplasia)     Assessment: Pharmacy consulted to dose warfarin for 82 yo male with history significant for atrial fibrillation. Patient takes warfarin 5mg  on Sunday/Tuesday/Wednesday/Friday/Saturday and warfarin 2.5mg  on Monday/Thursday. Patient is currently receiving Zosyn which has the potential to effect INR.   5/11 INR 2.45, Warfarin 2.5 mg 5/12 INR 2.78, Warfarin 2.5 mg 5/13 INR 3.12  Goal of Therapy:  INR 2-3  Plan:  INR is supratherapeutic. Will decrease dose to warfarin 2mg  on 5/13 and obtain INR with am labs.   Pharmacy will continue to monitor and adjust per consult.    [place] Clinical Pharmacist 11/26/2015,8:18 AM

## 2015-11-26 NOTE — Progress Notes (Deleted)
Notified Dr Leslye Peer of pt bladder scan result of 576 ml; also notified Dr that pt became agitated when I offered him the urinal and pt refused to try to void; Dr acknowledged, ordered I&O cath q6 hrs PRN

## 2015-11-27 LAB — GLUCOSE, CAPILLARY
Glucose-Capillary: 114 mg/dL — ABNORMAL HIGH (ref 65–99)
Glucose-Capillary: 216 mg/dL — ABNORMAL HIGH (ref 65–99)
Glucose-Capillary: 115 mg/dL — ABNORMAL HIGH (ref 65–99)
Glucose-Capillary: 172 mg/dL — ABNORMAL HIGH (ref 65–99)

## 2015-11-27 LAB — VANCOMYCIN, TROUGH: Vancomycin Tr: 5 ug/mL — ABNORMAL LOW (ref 10–20)

## 2015-11-27 LAB — PROTIME-INR
INR: 2.78
Prothrombin Time: 28.9 seconds — ABNORMAL HIGH (ref 11.4–15.0)

## 2015-11-27 NOTE — Progress Notes (Signed)
Patient ID: Dakota Thomas, male   DOB: 23-May-1934, 82 y.o.   MRN: [id number] [facility] PROGRESS NOTE  Dakota Thomas [id number] DOB: 1933/10/31 DOA: 11/24/2015 PCP: Tracie Harrier, MD  HPI/Subjective: Patient feeling okay. Less pain with urination and less abdominal pain. Offers no other complaints  Objective: Filed Vitals:   11/26/15 2059 11/27/15 0520  BP: 122/64 111/63  Pulse: 64 66  Temp: 98.6 F (37 C) 98.2 F (36.8 C)  Resp: 16 17    Filed Weights   11/24/15 0923  Weight: 91.627 kg (202 lb)    ROS: Review of Systems  Constitutional: Negative for fever and chills.  Eyes: Negative for blurred vision.  Respiratory: Negative for cough and shortness of breath.   Cardiovascular: Negative for chest pain.  Gastrointestinal: Positive for abdominal pain and diarrhea. Negative for nausea, vomiting and constipation.  Genitourinary: Positive for dysuria.  Musculoskeletal: Negative for joint pain.  Neurological: Negative for dizziness and headaches.   Exam: Physical Exam  Constitutional: He is oriented to person, place, and time.  HENT:  Nose: No mucosal edema.  Mouth/Throat: No oropharyngeal exudate or posterior oropharyngeal edema.  Eyes: Conjunctivae, EOM and lids are normal. Pupils are equal, round, and reactive to light.  Neck: No JVD present. Carotid bruit is not present. No edema present. No thyroid mass and no thyromegaly present.  Cardiovascular: S1 normal and S2 normal.  Exam reveals no gallop.   No murmur heard. Pulses:      Dorsalis pedis pulses are 2+ on the right side, and 2+ on the left side.  Respiratory: No respiratory distress. He has no wheezes. He has no rhonchi. He has no rales.  GI: Soft. Bowel sounds are normal. There is no tenderness.  Musculoskeletal:       Right ankle: He exhibits swelling.       Left ankle: He exhibits swelling.  Lymphadenopathy:    He has no cervical adenopathy.  Neurological: He is alert and oriented to person,  place, and time. No cranial nerve deficit.  Skin: Skin is warm. No rash noted. Nails show no clubbing.  Psychiatric: He has a normal mood and affect.      Data Reviewed: Basic Metabolic Panel:  Recent Labs Lab 11/22/15 1922 11/23/15 0513 11/24/15 1006 11/25/15 0454 11/26/15 0441  NA 134* 135 134* 137 139  K 4.2 4.4 3.4* 4.1 3.9  CL 100* 101 102 104 107  CO2 26 27 24 25 29   GLUCOSE 111* 127* 181* 136* 160*  BUN 17 16 16 13 10   CREATININE 1.18 1.06 0.96 0.90 0.78  CALCIUM 8.8* 8.7* 8.5* 8.2* 8.4*  MG 1.8  --  1.8  --   --    Liver Function Tests:  Recent Labs Lab 11/22/15 1922 11/24/15 1006  AST 25 19  ALT 14* 14*  ALKPHOS 55 48  BILITOT 1.9* 1.4*  PROT 7.2 6.8  ALBUMIN 3.8 3.3*    Recent Labs Lab 11/22/15 1922  LIPASE 17   CBC:  Recent Labs Lab 11/22/15 1715 11/23/15 0513 11/24/15 1006 11/25/15 0454 11/26/15 0441  WBC 16.3* 13.6* 8.9 7.2 6.2  NEUTROABS 12.5*  --  6.3  --   --   HGB 13.8 13.4 12.4* 12.2* 12.3*  HCT 41.4 40.1 36.5* 36.4* 36.9*  MCV 92.8 91.7 90.9 93.1 93.3  PLT 120* 108* 121* 132* 136*   Cardiac Enzymes:  Recent Labs Lab 11/22/15 1922 11/22/15 2325 11/23/15 0513 11/23/15 1138  CKTOTAL 226  --   --   --  TROPONINI 0.03 0.04* 0.03 0.03    CBG:  Recent Labs Lab 11/26/15 1119 11/26/15 1636 11/26/15 2122 11/27/15 0737 11/27/15 1140  GLUCAP 197* 122* 172* 114* 216*    Recent Results (from the past 240 hour(s))  CULTURE, BLOOD (ROUTINE X 2) w Reflex to PCR ID Panel     Status: None (Preliminary result)   Collection Time: 11/23/15  9:03 AM  Result Value Ref Range Status   Specimen Description BLOOD LEFT ASSIST CONTROL  Final   Special Requests   Final    BOTTLES DRAWN AEROBIC AND ANAEROBIC  ANAEROBIC 8CC, AEROBIC 12CC   Culture NO GROWTH 4 DAYS  Final   Report Status PENDING  Incomplete  CULTURE, BLOOD (ROUTINE X 2) w Reflex to PCR ID Panel     Status: Abnormal (Preliminary result)   Collection Time: 11/23/15  9:08  AM  Result Value Ref Range Status   Specimen Description BLOOD RIGHT ASSIST CONTROL  Final   Special Requests BOTTLES DRAWN AEROBIC AND ANAEROBIC  [place]  Final   Culture  Setup Time   Final    GRAM POSITIVE COCCI AEROBIC BOTTLE ONLY CRITICAL RESULT CALLED TO, READ BACK BY AND VERIFIED WITH: NATE COOKSON ON 11/24/15 AT 0531 BY TLB CONFIRMED BY TLB/CAF    Culture ENTEROCOCCUS FAECALIS AEROBIC BOTTLE ONLY  (A)  Final   Report Status PENDING  Incomplete   Organism ID, Bacteria ENTEROCOCCUS FAECALIS  Final      Susceptibility   Enterococcus faecalis - MIC*    AMPICILLIN <=2 SENSITIVE Sensitive     VANCOMYCIN 1 SENSITIVE Sensitive     GENTAMICIN SYNERGY SENSITIVE Sensitive     LINEZOLID 2 SENSITIVE Sensitive     * ENTEROCOCCUS FAECALIS  Blood Culture ID Panel (Reflexed)     Status: Abnormal   Collection Time: 11/23/15  9:08 AM  Result Value Ref Range Status   Enterococcus species DETECTED (A) NOT DETECTED Final    Comment: CRITICAL RESULT CALLED TO, READ BACK BY AND VERIFIED WITH: Nate Cookson at 0531 11/24/15 TLB    Vancomycin resistance NOT DETECTED NOT DETECTED Final   Listeria monocytogenes NOT DETECTED NOT DETECTED Final   Staphylococcus species NOT DETECTED NOT DETECTED Final   Staphylococcus aureus NOT DETECTED NOT DETECTED Final   Methicillin resistance NOT DETECTED NOT DETECTED Final   Streptococcus species NOT DETECTED NOT DETECTED Final   Streptococcus agalactiae NOT DETECTED NOT DETECTED Final   Streptococcus pneumoniae NOT DETECTED NOT DETECTED Final   Streptococcus pyogenes NOT DETECTED NOT DETECTED Final   Acinetobacter baumannii NOT DETECTED NOT DETECTED Final   Enterobacteriaceae species NOT DETECTED NOT DETECTED Final   Enterobacter cloacae complex NOT DETECTED NOT DETECTED Final   Escherichia coli NOT DETECTED NOT DETECTED Final   Klebsiella oxytoca NOT DETECTED NOT DETECTED Final   Klebsiella pneumoniae NOT DETECTED NOT DETECTED Final   Proteus species NOT  DETECTED NOT DETECTED Final   Serratia marcescens NOT DETECTED NOT DETECTED Final   Carbapenem resistance NOT DETECTED NOT DETECTED Final   Haemophilus influenzae NOT DETECTED NOT DETECTED Final   Neisseria meningitidis NOT DETECTED NOT DETECTED Final   Pseudomonas aeruginosa NOT DETECTED NOT DETECTED Final   Candida albicans NOT DETECTED NOT DETECTED Final   Candida glabrata NOT DETECTED NOT DETECTED Final   Candida krusei NOT DETECTED NOT DETECTED Final   Candida parapsilosis NOT DETECTED NOT DETECTED Final   Candida tropicalis NOT DETECTED NOT DETECTED Final  Urine culture     Status: Abnormal   Collection  Time: 11/24/15 10:06 AM  Result Value Ref Range Status   Specimen Description URINE, CLEAN CATCH  Final   Special Requests Normal  Final   Culture >=100,000 COLONIES/mL ENTEROCOCCUS FAECALIS (A)  Final   Report Status 11/26/2015 FINAL  Final   Organism ID, Bacteria ENTEROCOCCUS FAECALIS (A)  Final      Susceptibility   Enterococcus faecalis - MIC*    AMPICILLIN <=2 SENSITIVE Sensitive     LEVOFLOXACIN 1 SENSITIVE Sensitive     NITROFURANTOIN <=16 SENSITIVE Sensitive     VANCOMYCIN 1 SENSITIVE Sensitive     * >=100,000 COLONIES/mL ENTEROCOCCUS FAECALIS  Culture, blood (routine x 2)     Status: None (Preliminary result)   Collection Time: 11/24/15 10:06 AM  Result Value Ref Range Status   Specimen Description BLOOD RIGHT HAND  Final   Special Requests BOTTLES DRAWN AEROBIC AND ANAEROBIC 7ML  Final   Culture NO GROWTH 3 DAYS  Final   Report Status PENDING  Incomplete  Culture, blood (routine x 2)     Status: None (Preliminary result)   Collection Time: 11/24/15 10:45 AM  Result Value Ref Range Status   Specimen Description BLOOD LEFT AC  Final   Special Requests   Final    BOTTLES DRAWN AEROBIC AND ANAEROBIC AER 6ML ANA 5ML   Culture NO GROWTH 3 DAYS  Final   Report Status PENDING  Incomplete     Scheduled Meds: . amiodarone  100 mg Oral Daily  . ampicillin (OMNIPEN) IV   2 g Intravenous Q4H  . gabapentin  600 mg Oral TID  . insulin aspart  0-5 Units Subcutaneous QHS  . insulin aspart  0-9 Units Subcutaneous TID WC  . insulin glargine  25 Units Subcutaneous QHS  . levothyroxine  100 mcg Oral QAC breakfast  . omega-3 acid ethyl esters  1,000 mg Oral Daily  . pantoprazole  40 mg Oral Daily  . pyridOXINE  100 mg Oral Daily  . tamsulosin  0.4 mg Oral QPC supper  . warfarin  2 mg Oral q1800  . Warfarin - Pharmacist Dosing Inpatient   Does not apply q1800    Assessment/Plan:  1. Bacteremia with enterococcus. Likely urine source. IV antibiotics until TEE done. Continue ampicillin. Based on the results of the TEE will decide on PICC line and antibiotics versus oral antibiotics. 2. Urinary frequency and lower abdominal pain. I will start low-dose Flomax.  3. Atrial fibrillation on amiodarone and warfarin 4. Hypothyroidism unspecified on Synthroid 5. Type 2 diabetes without complication on Lantus and sliding scale insulin 6. Chronic diarrhea. As needed Imodium 7. Diabetic neuropathy on gabapentin  Code Status:     Code Status Orders        Start     Ordered   11/24/15 1624  Full code   Continuous     11/24/15 1623    Code Status History    Date Active Date Inactive Code Status Order ID Comments User Context   11/22/2015 11:21 PM 11/23/2015  5:31 PM Full Code [id number]  Lance Coon, MD Inpatient    Advance Directive Documentation        Most Recent Value   Type of Advance Directive  Healthcare Power of Attorney, Living will   Pre-existing out of facility DNR order (yellow form or pink MOST form)     "MOST" Form in Place?       Family Communication: Wife at bedside Disposition Plan: Awaiting TEE on Monday and then further recommendations by  infectious disease on antibiotic therapy  Consultants:  Infectious disease  Cardiology  Antibiotics:  Ampicillin  Time spent: 24 minutes  [place], [place]

## 2015-11-27 NOTE — Progress Notes (Signed)
ANTICOAGULATION CONSULT NOTE -Follow Up Consult  Pharmacy Consult for Coumadin Indication: atrial fibrillation  Allergies  Allergen Reactions  . Ciprofloxacin   . Levaquin [Levofloxacin] Swelling    Reaction: swelling of throat   . Tannic Acid Hives    Patient Measurements: Height: 6\' 1"  (185.4 cm) Weight: 202 lb (91.627 kg) IBW/kg (Calculated) : 79.9  Vital Signs: Temp: 98.2 F (36.8 C) (05/14 0520) Temp Source: Oral (05/14 0520) BP: 111/63 mmHg (05/14 0520) Pulse Rate: 66 (05/14 0520)  Labs:  Recent Labs  11/24/15 1006 11/25/15 0454 11/26/15 0441 11/27/15 0339  HGB 12.4* 12.2* 12.3*  --   HCT 36.5* 36.4* 36.9*  --   PLT 121* 132* 136*  --   LABPROT 26.3* 28.9* 31.5* 28.9*  INR 2.45 2.78 3.12 2.78  CREATININE 0.96 0.90 0.78  --     Estimated Creatinine Clearance: 84.6 mL/min (by C-G formula based on Cr of 0.78).   Medical History: Past Medical History  Diagnosis Date  . Diabetes mellitus without complication ([place])   . Hypertension   . Heart attack ([place])   . Pacemaker   . Renal disorder   . CAD (coronary artery disease)   . A-fib ([place])   . BPH (benign prostatic hyperplasia)     Assessment: Pharmacy consulted to dose warfarin for 82 yo male with history significant for atrial fibrillation. Patient takes warfarin 5mg  on Sunday/Tuesday/Wednesday/Friday/Saturday and warfarin 2.5mg  on Monday/Thursday. Patient is currently receiving Zosyn which has the potential to effect INR.   5/11 INR 2.45, Warfarin 2.5 mg 5/12 INR 2.78, Warfarin 2.5 mg 5/13 INR 3.12, Warfarin 2 mg 5/14 INR 2.78  Goal of Therapy:  INR 2-3  Plan:  INR is now therapeutic. Will continue current dose of warfarin 2mg   and obtain INR with am labs.   Pharmacy will continue to monitor and adjust per consult.    [place] Clinical Pharmacist 11/27/2015,7:14 AM

## 2015-11-27 NOTE — Consult Note (Signed)
[name]  CARDIOLOGY CONSULT NOTE  Patient ID: Dakota Thomas MRN: [id number] DOB/AGE: 12-01-1933 82 y.o.  Admit date: 11/24/2015 Referring Physician Dr. Earleen Newport Primary Physician Dr. Early Chars Primary Cardiologist Dr. Nehemiah Massed Reason for Consultation bacteremia For tee  HPI: Pt is a 82 yo male with hstory of severe chf, cad s/p aicd with histor of apical thrombus treated with chronic warfarin who was admitted after bcx revealed enterococcus after blood cultures were drawn for fever. . Transthoracic echo revealed ef of 20% with no vegetations. No apical thrombus noted but apex was dyskinetic.   Review of Systems  Constitutional: Positive for fever.  HENT: Negative.   Eyes: Negative.   Respiratory: Positive for shortness of breath.   Cardiovascular: Negative.   Gastrointestinal: Negative.   Genitourinary: Negative.   Musculoskeletal: Negative.   Skin: Negative.   Neurological: Negative.   Endo/Heme/Allergies: Negative.   Psychiatric/Behavioral: Negative.     Past Medical History  Diagnosis Date  . Diabetes mellitus without complication ([place])   . Hypertension   . Heart attack ([place])   . Pacemaker   . Renal disorder   . CAD (coronary artery disease)   . A-fib ([place])   . BPH (benign prostatic hyperplasia)     Family History  Problem Relation Age of Onset  . CAD    . Diabetes    . Hypertension      Social History   Social History  . Marital Status: Married    Spouse Name: N/A  . Number of Children: N/A  . Years of Education: N/A   Occupational History  . Not on file.   Social History Main Topics  . Smoking status: Former Research scientist (life sciences)  . Smokeless tobacco: Not on file  . Alcohol Use: Yes  . Drug Use: No  . Sexual Activity: Not on file   Other Topics Concern  . Not on file   Social History Narrative    Past Surgical History  Procedure Laterality Date  . Cholecystectomy    . Pacemaker insertion    . Tonsillectomy     . Coronary artery bypass graft    . Coronary angioplasty with stent placement    . Appendectomy       Prescriptions prior to admission  Medication Sig Dispense Refill Last Dose  . Alpha-Lipoic Acid 600 MG CAPS Take 600 mg by mouth daily.   11/23/2015 at Unknown time  . ALPRAZolam (XANAX) 0.5 MG tablet Take 0.5 mg by mouth 3 (three) times daily as needed for anxiety.   prn at prn  . amiodarone (PACERONE) 200 MG tablet Take 100 mg by mouth daily.   11/24/2015 at Unknown time  . Coenzyme Q10 (CO Q 10) 10 MG CAPS Take 10 mg by mouth daily.   11/24/2015 at Unknown time  . gabapentin (NEURONTIN) 600 MG tablet Take 1 tablet by mouth 3 (three) times daily.   11/24/2015 at Unknown time  . HYDROcodone-acetaminophen (NORCO/VICODIN) 5-325 MG tablet Take 1 tablet by mouth daily as needed.  0 prn at prn  . insulin glargine (LANTUS) 100 unit/mL SOPN Inject 25 Units into the skin at bedtime.   11/23/2015 at Unknown time  . levothyroxine (SYNTHROID, LEVOTHROID) 100 MCG tablet Take 100 mcg by mouth daily before breakfast.   11/24/2015 at Unknown time  . loperamide (IMODIUM) 2 MG capsule Take 2 mg by mouth as needed for diarrhea or loose stools.   prn at prn  . metoprolol succinate (TOPROL-XL) 25 MG 24  hr tablet Take 25 mg by mouth daily.   11/24/2015 at 0600  . Multiple Vitamin (MULTIVITAMIN WITH MINERALS) TABS tablet Take 1 tablet by mouth daily.   11/23/2015 at Unknown time  . NOVOLOG FLEXPEN 100 UNIT/ML FlexPen Inject 2-10 Units into the skin 3 (three) times daily. Per sliding scale  12 11/24/2015 at Unknown time  . omeprazole (PRILOSEC) 20 MG capsule Take 20 mg by mouth daily.   11/24/2015 at Unknown time  . Potassium Gluconate 550 (90 K) MG TABS Take 90 mg by mouth every other day.   11/23/2015 at Unknown time  . pyridOXINE (B-6) 50 MG tablet Take 100 mg by mouth daily.   11/23/2015 at Unknown time  . spironolactone (ALDACTONE) 25 MG tablet Take 25 mg by mouth daily.   11/24/2015 at Unknown time  . testosterone  cypionate (DEPOTESTOSTERONE CYPIONATE) 200 MG/ML injection Inject 200 mg into the muscle every 28 (twenty-eight) days.   Past Month at Unknown time  . warfarin (COUMADIN) 2.5 MG tablet Take 1 tablet by mouth daily. Take 2.5mg  Monday and Thursday   11/21/2015 at pm   . warfarin (COUMADIN) 5 MG tablet Take 1 tablet by mouth daily. Take 5mg  Sunday, Tuesday, Wednesday, Friday, Saturday   11/23/2015 at Unknown time    Physical Exam: Blood pressure 111/63, pulse 66, temperature 98.2 F (36.8 C), temperature source Oral, resp. rate 17, height 6\' 1"  (1.854 m), weight 91.627 kg (202 lb), SpO2 96 %.   Wt Readings from Last 1 Encounters:  11/24/15 91.627 kg (202 lb)     General appearance: alert and cooperative Resp: clear to auscultation bilaterally Cardio: regular rate and rhythm Extremities: extremities normal, atraumatic, no cyanosis or edema Neurologic: Grossly normal  Labs:   Lab Results  Component Value Date   WBC 6.2 11/26/2015   HGB 12.3* 11/26/2015   HCT 36.9* 11/26/2015   MCV 93.3 11/26/2015   PLT 136* 11/26/2015    Recent Labs Lab 11/24/15 1006  11/26/15 0441  NA 134*  < > 139  K 3.4*  < > 3.9  CL 102  < > 107  CO2 24  < > 29  BUN 16  < > 10  CREATININE 0.96  < > 0.78  CALCIUM 8.5*  < > 8.4*  PROT 6.8  --   --   BILITOT 1.4*  --   --   ALKPHOS 48  --   --   ALT 14*  --   --   AST 19  --   --   GLUCOSE 181*  < > 160*  < > = values in this interval not displayed. Lab Results  Component Value Date   CKTOTAL 226 11/22/2015   TROPONINI 0.03 11/23/2015        ASSESSMENT AND PLAN:  Bactaremia with no vegetaions noted on transthorqacic echo. For tee to better evaluate for vegetations. Risk and benefits explained to patient. Tere in am.  Signed: Teodoro Spray MD, [facility] 11/27/2015, 11:15 AM

## 2015-11-28 ENCOUNTER — Inpatient Hospital Stay
Admit: 2015-11-28 | Discharge: 2015-11-28 | Disposition: A | Discharge status: Home / Self Care | Payer: Commercial Managed Care - HMO | Attending: Cardiology | Admitting: Cardiology

## 2015-11-28 ENCOUNTER — Encounter: Payer: Self-pay | Admitting: Cardiology

## 2015-11-28 ENCOUNTER — Encounter
Admission: EM | Disposition: A | Discharge status: Home / Self Care | Payer: Self-pay | Source: Home / Self Care | Attending: Internal Medicine

## 2015-11-28 ENCOUNTER — Inpatient Hospital Stay: Admit: 2015-11-28 | Payer: Commercial Managed Care - HMO

## 2015-11-28 HISTORY — PX: TEE WITHOUT CARDIOVERSION: SHX5443

## 2015-11-28 LAB — CULTURE, BLOOD (ROUTINE X 2): Culture: NO GROWTH

## 2015-11-28 LAB — GLUCOSE, CAPILLARY: Glucose-Capillary: 132 mg/dL — ABNORMAL HIGH (ref 65–99)

## 2015-11-28 LAB — PROTIME-INR
INR: 2.74
Prothrombin Time: 28.6 seconds — ABNORMAL HIGH (ref 11.4–15.0)

## 2015-11-28 SURGERY — ECHOCARDIOGRAM, TRANSESOPHAGEAL
Anesthesia: Moderate Sedation

## 2015-11-28 MED ORDER — SODIUM CHLORIDE 0.9 % IV SOLN
INTRAVENOUS | Status: DC
  Administered 2015-11-28: 08:00:00 via INTRAVENOUS

## 2015-11-28 MED ORDER — AMOXICILLIN 500 MG PO CAPS: 1000.0000 mg | ORAL_CAPSULE | Freq: Two times a day (BID) | ORAL | Status: DC

## 2015-11-28 MED ORDER — FENTANYL CITRATE (PF) 100 MCG/2ML IJ SOLN
INTRAMUSCULAR | Status: AC | PRN
  Administered 2015-11-28: 50 ug via INTRAVENOUS
  Administered 2015-11-28 (×2): 25 ug via INTRAVENOUS

## 2015-11-28 MED ORDER — FENTANYL CITRATE (PF) 100 MCG/2ML IJ SOLN
INTRAMUSCULAR | Status: AC
  Filled 2015-11-28: qty 4

## 2015-11-28 MED ORDER — WARFARIN SODIUM 2 MG PO TABS: 2.0000 mg | ORAL_TABLET | Freq: Every day | ORAL | Status: DC

## 2015-11-28 MED ORDER — TAMSULOSIN HCL 0.4 MG PO CAPS: 0.4000 mg | ORAL_CAPSULE | Freq: Every day | ORAL | Status: DC

## 2015-11-28 MED ORDER — MIDAZOLAM HCL 5 MG/5ML IJ SOLN
INTRAMUSCULAR | Status: AC
  Filled 2015-11-28: qty 10

## 2015-11-28 MED ORDER — AMOXICILLIN 500 MG PO CAPS
1000.0000 mg | ORAL_CAPSULE | Freq: Two times a day (BID) | ORAL | Status: DC
  Filled 2015-11-28 (×2): qty 2

## 2015-11-28 MED ORDER — BUTAMBEN-TETRACAINE-BENZOCAINE 2-2-14 % EX AERO
INHALATION_SPRAY | CUTANEOUS | Status: AC
  Filled 2015-11-28: qty 20

## 2015-11-28 MED ORDER — MIDAZOLAM HCL 2 MG/2ML IJ SOLN
INTRAMUSCULAR | Status: AC | PRN
  Administered 2015-11-28 (×2): 1 mg via INTRAVENOUS
  Administered 2015-11-28: 2 mg via INTRAVENOUS

## 2015-11-28 MED ORDER — SODIUM CHLORIDE FLUSH 0.9 % IV SOLN
INTRAVENOUS | Status: AC
  Filled 2015-11-28: qty 10

## 2015-11-28 MED ORDER — LIDOCAINE VISCOUS 2 % MT SOLN
OROMUCOSAL | Status: AC
  Filled 2015-11-28: qty 15

## 2015-11-28 MED ORDER — NOVOLOG FLEXPEN 100 UNIT/ML ~~LOC~~ SOPN: PEN_INJECTOR | SUBCUTANEOUS | Status: DC

## 2015-11-28 NOTE — Procedures (Addendum)
   TRANSESOPHAGEAL ECHOCARDIOGRAM   NAME:  Dakota Thomas   MRN: [id number] DOB:  18-Jun-1934   ADMIT DATE: 11/24/2015  INDICATIONS:   PROCEDURE:   Informed consent was obtained prior to the procedure. The risks, benefits and alternatives for the procedure were discussed and the patient comprehended these risks.  Risks include, but are not limited to, cough, sore throat, vomiting, nausea, somnolence, esophageal and stomach trauma or perforation, bleeding, low blood pressure, aspiration, pneumonia, infection, trauma to the teeth and death.    After a procedural time-out, the patient was given 3 mg versed and 75 mcg fentanyl for moderate sedation.  The oropharynx was anesthetized with cetacaine spray.  The transesophageal probe was inserted in the esophagus and stomach without difficulty and multiple views were obtained.    COMPLICATIONS:    There were no immediate complications.  FINDINGS:  LEFT VENTRICLE: EF = 55%. No regional wall motion abnormalities.  RIGHT VENTRICLE: Normal size and function.   LEFT ATRIUM: Normal size with no thrombus  LEFT ATRIAL APPENDAGE: No thrombus.   RIGHT ATRIUM: normal size. AICD lead noted. No thrombus or vegetaion seen on lead  AORTIC VALVE:  [place].   MITRAL VALVE:    Normal.  TRICUSPID VALVE: Normal.  PULMONIC VALVE: Grossly normal.  INTERATRIAL SEPTUM: No PFO or ASD noted.  PERICARDIUM: No effusion  DESCENDING AORTA:   CONCLUSION:  No vegetations seen on aortic, mitral, tricuspid or pulmonic valve. AICD leads noted with no vegetations noted. This does not rule out endocarditis. No immediate complications.

## 2015-11-28 NOTE — Progress Notes (Signed)
Pt d/c to home today.  IV removed intact.  Rx's given to pt w/all questions and concerns addressed.  D/C paperwork reviewed and education provided with all questions and concerns addressed.  Pt wife at bedside for home transport.    Pt refused wheel chair for discharge.   Pt also refused medications and stated he will take them when he gets home.

## 2015-11-28 NOTE — Progress Notes (Signed)
ANTICOAGULATION CONSULT NOTE -Follow Up Consult  Pharmacy Consult for Coumadin Indication: atrial fibrillation  Allergies  Allergen Reactions  . Ciprofloxacin   . Levaquin [Levofloxacin] Swelling    Reaction: swelling of throat   . Tannic Acid Hives   Patient Measurements: Height: 6\' 1"  (185.4 cm) Weight: 202 lb (91.627 kg) IBW/kg (Calculated) : 79.9  Vital Signs: Temp: 97.6 F (36.4 C) (05/15 0924) Temp Source: Oral (05/15 0924) BP: 97/66 mmHg (05/15 0924) Pulse Rate: 64 (05/15 0924)  Labs:  Recent Labs  11/26/15 0441 11/27/15 0339 11/28/15 0442  HGB 12.3*  --   --   HCT 36.9*  --   --   PLT 136*  --   --   LABPROT 31.5* 28.9* 28.6*  INR 3.12 2.78 2.74  CREATININE 0.78  --   --     Estimated Creatinine Clearance: 84.6 mL/min (by C-G formula based on Cr of 0.78).  Medical History: Past Medical History  Diagnosis Date  . Diabetes mellitus without complication ([place])   . Hypertension   . Heart attack ([place])   . Pacemaker   . Renal disorder   . CAD (coronary artery disease)   . A-fib ([place])   . BPH (benign prostatic hyperplasia)     Assessment: Pharmacy consulted to dose warfarin for 82 yo male with history significant for atrial fibrillation. Patient takes warfarin 5mg  on Sunday/Tuesday/Wednesday/Friday/Saturday and warfarin 2.5mg  on Monday/Thursday. Patient is currently receiving piperacillin/tazobactam which has the potential to effect INR.   INR of 2.74 is therapeutic today  Dosing history: 5/11 INR 2.45, Warfarin 2.5 mg 5/12 INR 2.78, Warfarin 2.5 mg 5/13 INR 3.12, Warfarin 2 mg 5/14 INR 2.78, warfarin 2 mg  Goal of Therapy:  INR 2-3  Plan:  INR remains therapeutic. Will continue current dose of warfarin 2mg   and obtain INR with am labs.   Pharmacy will continue to monitor and adjust per consult.    Lenis Noon, PharmD Clinical Pharmacist 11/28/2015,9:25 AM

## 2015-11-28 NOTE — Progress Notes (Signed)
*  PRELIMINARY RESULTS* Echocardiogram Echocardiogram Transesophageal has been performed.  Dakota Thomas 11/28/2015, 8:43 AM

## 2015-11-28 NOTE — Discharge Summary (Signed)
[facility] [place] NAME: Dakota Thomas    MR#:  [id number]  DATE OF BIRTH:  Nov 28, 1933  DATE OF ADMISSION:  11/24/2015 ADMITTING PHYSICIAN: Demetrios Loll, MD  DATE OF DISCHARGE: 11/28/2015  1:58 PM  PRIMARY CARE PHYSICIAN: Tracie Harrier, MD    ADMISSION DIAGNOSIS:  Bacteremia [R78.81] UTI (lower urinary tract infection) [N39.0]  DISCHARGE DIAGNOSIS:  Active Problems:   Bacteremia   SECONDARY DIAGNOSIS:   Past Medical History  Diagnosis Date  . Diabetes mellitus without complication ([place])   . Hypertension   . Heart attack ([place])   . Pacemaker   . Renal disorder   . CAD (coronary artery disease)   . A-fib ([place])   . BPH (benign prostatic hyperplasia)     HOSPITAL COURSE:   1. Bacteremia with enterococcus. The patient was on IV vancomycin and ampicillin. The enterococcus was sensitive to ampicillin and vancomycin was stopped. TEE was done which showed no vegetation on the heart valve or wires. Patient switched over to amoxicillin high-dose thousand milligrams orally twice a day for total of 14 days from the negative blood cultures. 2. Urinary frequency and lower abdominal pain. Acute cystitis without hematuria. Urine culture grew out enterococcus also. I started low-dose Flomax for BPH. Suggest follow up with urology as outpatient 3. Atrial fibrillation on amiodarone and warfarin 4. Hypothyroidism unspecified on Synthroid 5. Type 2 diabetes without complication on Lantus and sliding scale 6. Chronic diarrhea as needed Imodium 7. Diabetic neuropathy and gabapentin  DISCHARGE CONDITIONS:   Satisfactory  CONSULTS OBTAINED:  Treatment Team:  Leonel Ramsay, MD  DRUG ALLERGIES:   Allergies  Allergen Reactions  . Ciprofloxacin   . Levaquin [Levofloxacin] Swelling    Reaction: swelling of throat   . Tannic Acid Hives    DISCHARGE MEDICATIONS:   Discharge Medication List as of 11/28/2015 11:53 AM    START taking  these medications   Details  amoxicillin (AMOXIL) 500 MG capsule Take 2 capsules (1,000 mg total) by mouth every 12 (twelve) hours., Starting 11/28/2015, Until Discontinued, Print    tamsulosin (FLOMAX) 0.4 MG CAPS capsule Take 1 capsule (0.4 mg total) by mouth daily after supper., Starting 11/28/2015, Until Discontinued, Print      CONTINUE these medications which have CHANGED   Details  NOVOLOG FLEXPEN 100 UNIT/ML FlexPen For Fingerstick 121 to 150 give 1 unit subcutanous injection; 151-200- 2 units; 201-250 3 units; 251-300 5 units; 301-350 7 units; greater than 351 9 units., Print    warfarin (COUMADIN) 2 MG tablet Take 1 tablet (2 mg total) by mouth daily at 6 PM., Starting 11/28/2015, Until Discontinued, Print      CONTINUE these medications which have NOT CHANGED   Details  Alpha-Lipoic Acid 600 MG CAPS Take 600 mg by mouth daily., Until Discontinued, Historical Med    ALPRAZolam (XANAX) 0.5 MG tablet Take 0.5 mg by mouth 3 (three) times daily as needed for anxiety., Until Discontinued, Historical Med    amiodarone (PACERONE) 200 MG tablet Take 100 mg by mouth daily., Until Discontinued, Historical Med    Coenzyme Q10 (CO Q 10) 10 MG CAPS Take 10 mg by mouth daily., Until Discontinued, Historical Med    gabapentin (NEURONTIN) 600 MG tablet Take 1 tablet by mouth 3 (three) times daily., Starting 10/10/2015, Until Discontinued, Historical Med    HYDROcodone-acetaminophen (NORCO/VICODIN) 5-325 MG tablet Take 1 tablet by mouth daily as needed., Starting 10/12/2015, Until Discontinued, Historical Med    insulin glargine (  LANTUS) 100 unit/mL SOPN Inject 25 Units into the skin at bedtime., Until Discontinued, Historical Med    levothyroxine (SYNTHROID, LEVOTHROID) 100 MCG tablet Take 100 mcg by mouth daily before breakfast., Until Discontinued, Historical Med    loperamide (IMODIUM) 2 MG capsule Take 2 mg by mouth as needed for diarrhea or loose stools., Until Discontinued, Historical Med     Multiple Vitamin (MULTIVITAMIN WITH MINERALS) TABS tablet Take 1 tablet by mouth daily., Until Discontinued, Historical Med    omeprazole (PRILOSEC) 20 MG capsule Take 20 mg by mouth daily., Until Discontinued, Historical Med    Potassium Gluconate 550 (90 K) MG TABS Take 90 mg by mouth every other day., Until Discontinued, Historical Med    pyridOXINE (B-6) 50 MG tablet Take 100 mg by mouth daily., Until Discontinued, Historical Med      STOP taking these medications     metoprolol succinate (TOPROL-XL) 25 MG 24 hr tablet      spironolactone (ALDACTONE) 25 MG tablet      testosterone cypionate (DEPOTESTOSTERONE CYPIONATE) 200 MG/ML injection          DISCHARGE INSTRUCTIONS:   Follow-up with PMD one week Follow up with urology as outpatient  If you experience worsening of your admission symptoms, develop shortness of breath, life threatening emergency, suicidal or homicidal thoughts you must seek medical attention immediately by calling 911 or calling your MD immediately  if symptoms less severe.  You Must read complete instructions/literature along with all the possible adverse reactions/side effects for all the Medicines you take and that have been prescribed to you. Take any new Medicines after you have completely understood and accept all the possible adverse reactions/side effects.   Please note  You were cared for by a hospitalist during your hospital stay. If you have any questions about your discharge medications or the care you received while you were in the hospital after you are discharged, you can call the unit and asked to speak with the hospitalist on call if the hospitalist that took care of you is not available. Once you are discharged, your primary care physician will handle any further medical issues. Please note that NO REFILLS for any discharge medications will be authorized once you are discharged, as it is imperative that you return to your primary care  physician (or establish a relationship with a primary care physician if you do not have one) for your aftercare needs so that they can reassess your need for medications and monitor your lab values.    Today   CHIEF COMPLAINT:   Chief Complaint  Patient presents with  . Abnormal Labs     HISTORY OF PRESENT ILLNESS:  Dakota Thomas  is a 82 y.o. male brought him with positive blood cultures   VITAL SIGNS:  Blood pressure 97/66, pulse 64, temperature 97.6 F (36.4 C), temperature source Oral, resp. rate 18, height 6\' 1"  (1.854 m), weight 91.627 kg (202 lb), SpO2 92 %.    PHYSICAL EXAMINATION:  GENERAL:  82 y.o.-year-old patient lying in the bed with no acute distress.  EYES: Pupils equal, round, reactive to light and accommodation. No scleral icterus. Extraocular muscles intact.  HEENT: Head atraumatic, normocephalic. Oropharynx and nasopharynx clear.  NECK:  Supple, no jugular venous distention. No thyroid enlargement, no tenderness.  LUNGS: Normal breath sounds bilaterally, no wheezing, rales,rhonchi or crepitation. No use of accessory muscles of respiration.  CARDIOVASCULAR: S1, S2 normal. No murmurs, rubs, or gallops.  ABDOMEN: Soft, non-tender, non-distended. Bowel sounds  present. No organomegaly or mass.  EXTREMITIES: No pedal edema, cyanosis, or clubbing.  NEUROLOGIC: Cranial nerves II through XII are intact. Muscle strength 5/5 in all extremities. Sensation intact. Gait not checked.  PSYCHIATRIC: The patient is alert and oriented x 3.  SKIN: No obvious rash, lesion, or ulcer.   DATA REVIEW:   CBC  Recent Labs Lab 11/26/15 0441  WBC 6.2  HGB 12.3*  HCT 36.9*  PLT 136*    Chemistries   Recent Labs Lab 11/24/15 1006  11/26/15 0441  NA 134*  < > 139  K 3.4*  < > 3.9  CL 102  < > 107  CO2 24  < > 29  GLUCOSE 181*  < > 160*  BUN 16  < > 10  CREATININE 0.96  < > 0.78  CALCIUM 8.5*  < > 8.4*  MG 1.8  --   --   AST 19  --   --   ALT 14*  --   --   ALKPHOS  48  --   --   BILITOT 1.4*  --   --   < > = values in this interval not displayed.  Cardiac Enzymes  Recent Labs Lab 11/23/15 1138  TROPONINI 0.03    Microbiology Results  Results for orders placed or performed during the hospital encounter of 11/24/15  Urine culture     Status: Abnormal   Collection Time: 11/24/15 10:06 AM  Result Value Ref Range Status   Specimen Description URINE, CLEAN CATCH  Final   Special Requests Normal  Final   Culture >=100,000 COLONIES/mL ENTEROCOCCUS FAECALIS (A)  Final   Report Status 11/26/2015 FINAL  Final   Organism ID, Bacteria ENTEROCOCCUS FAECALIS (A)  Final      Susceptibility   Enterococcus faecalis - MIC*    AMPICILLIN <=2 SENSITIVE Sensitive     LEVOFLOXACIN 1 SENSITIVE Sensitive     NITROFURANTOIN <=16 SENSITIVE Sensitive     VANCOMYCIN 1 SENSITIVE Sensitive     * >=100,000 COLONIES/mL ENTEROCOCCUS FAECALIS  Culture, blood (routine x 2)     Status: None (Preliminary result)   Collection Time: 11/24/15 10:06 AM  Result Value Ref Range Status   Specimen Description BLOOD RIGHT HAND  Final   Special Requests BOTTLES DRAWN AEROBIC AND ANAEROBIC 7ML  Final   Culture NO GROWTH 4 DAYS  Final   Report Status PENDING  Incomplete  Culture, blood (routine x 2)     Status: None (Preliminary result)   Collection Time: 11/24/15 10:45 AM  Result Value Ref Range Status   Specimen Description BLOOD LEFT AC  Final   Special Requests   Final    BOTTLES DRAWN AEROBIC AND ANAEROBIC AER 6ML ANA 5ML   Culture NO GROWTH 4 DAYS  Final   Report Status PENDING  Incomplete    Management plans discussed with the patient, family and they are in agreement.  CODE STATUS:     Code Status Orders        Start     Ordered   11/24/15 1624  Full code   Continuous     11/24/15 1623    Code Status History    Date Active Date Inactive Code Status Order ID Comments User Context   11/22/2015 11:21 PM 11/23/2015  5:31 PM Full Code [id number]  Lance Coon, MD  Inpatient    Advance Directive Documentation        Most Recent Value   Type of Advance Directive  Healthcare Power of Attorney, Living will   Pre-existing out of facility DNR order (yellow form or pink MOST form)     "MOST" Form in Place?        TOTAL TIME TAKING CARE OF THIS PATIENT: 35 minutes.    Loletha Grayer M.D on 11/28/2015 at 3:36 PM  Between 7am to 6pm - Pager - [phone]  After 6pm go to [email or web address] - password [email or web address]  [facility] Office  [phone]  CC: Primary care physician; Tracie Harrier, MD

## 2015-11-28 NOTE — Progress Notes (Signed)
*  PRELIMINARY RESULTS* Echocardiogram 2D Echocardiogram has been performed.  Dakota Thomas 11/28/2015, 8:42 AM

## 2015-11-28 NOTE — Care Management (Signed)
Patient will not need IV antibiotics.  Will DC on oral antibiotics.

## 2015-11-28 NOTE — Care Management Important Message (Signed)
Important Message  Patient Details  Name: Dakota Thomas MRN: [id number] Date of Birth: August 02, 1933   Medicare Important Message Given:  Yes    Juliann Pulse A Heyli Min 11/28/2015, 11:12 AM

## 2015-11-28 NOTE — Discharge Instructions (Signed)
Bacteremia °Bacteremia is the presence of bacteria in the blood. A small amount of bacteria may not cause any symptoms. °Sometimes, the bacteria spread and cause infection in other parts of the body, such as the heart, joints, bones, or brain. Having a great amount of bacteria can cause a serious, sometimes life-threatening infection called sepsis. °CAUSES °This condition is caused by bacteria that get into the blood. Bacteria can enter the blood: °· During a dental or medical procedure. °· After you brush your teeth so hard that the gums bleed. °· Through a scrape or cut on your skin. °More severe types of bacteremia can be caused by: °· A bacterial infection, such as pneumonia, that spreads to the blood. °· Using a dirty needle. °RISK FACTORS °This condition is more likely to develop in: °· Children and elderly adults. °· People who have a long-lasting (chronic) disease or medical condition. °· People who have an artificial joint or heart valve. °· People who have heart valve disease. °· People who have a tube, such as a catheter or IV tube, that has been inserted for a medical treatment. °· People who have a weak body defense system (immune system). °· People who use IV drugs. °SYMPTOMS °Usually, this condition does not cause symptoms when it is mild. When it is more serious, it may cause: °· Fever. °· Chills. °· Racing heart. °· Shortness of breath. °· Dizziness. °· Weakness. °· Confusion. °· Nausea or vomiting. °· Diarrhea. °Bacteremia that has spread to other parts of the body may cause symptoms in those areas. °DIAGNOSIS °This condition may be diagnosed with a physical exam and tests, such as: °· A complete blood count (CBC). This test looks for signs of infection. °· Blood cultures. These look for bacteria in your blood. °· Tests of any IV tubes. These look for a source of infection. °· Urine tests. °· Imaging tests, such as an X-ray, CT scan, MRI, or heart ultrasound. °TREATMENT °If the condition is mild,  treatment is usually not needed. Usually, the body's immune system will remove the bacteria. If the condition is more serious, it may be treated with: °· Antibiotic medicines through an IV tube. These may be given for about 2 weeks. At first, the antibiotic that is given may kill most types of blood bacteria. If your test results show that a certain kind of bacteria is causing problems, the antibiotic may be changed to kill only the bacteria that are causing problems. °· Antibiotics taken by mouth. °· Removing any catheter or IV tube that is a source of infection. °· Blood pressure and breathing support, if needed. °· Surgery to control the source or spread of infection, if needed. °HOME CARE INSTRUCTIONS °· Take over-the-counter and prescription medicines only as told by your health care provider. °· If you were prescribed an antibiotic, take it as told by your health care provider. Do not stop taking the antibiotic even if you start to feel better. °· Rest at home until your condition is under control. °· Drink enough fluid to keep your urine clear or pale yellow. °· Keep all follow-up visits as told by your health care provider. This is important. °PREVENTION °Take these actions to help prevent future episodes of bacteremia: °· Get all vaccinations as recommended by your health care provider. °· Clean and cover scrapes or cuts. °· Bathe regularly. °· Wash your hands often. °· Before any dental or surgical procedure, ask your health care provider if you should take an antibiotic. °SEEK MEDICAL   CARE IF: °· Your symptoms get worse. °· You continue to have symptoms after treatment. °· You develop new symptoms after treatment. °SEEK IMMEDIATE MEDICAL CARE IF: °· You have chest pain or trouble breathing. °· You develop confusion, dizziness, or weakness. °· You develop pale skin. °  °This information is not intended to replace advice given to you by your health care provider. Make sure you discuss any questions you have  with your health care provider. °  °Document Released: 04/15/2006 Document Revised: 03/23/2015 Document Reviewed: 09/04/2014 °Elsevier Interactive Patient Education ©2016 [employer] °Urinary Tract Infection °Urinary tract infections (UTIs) can develop anywhere along your urinary tract. Your urinary tract is your body's drainage system for removing wastes and extra water. Your urinary tract includes two kidneys, two ureters, a bladder, and a urethra. Your kidneys are a pair of bean-shaped organs. Each kidney is about the size of your fist. They are located below your ribs, one on each side of your spine. °CAUSES °Infections are caused by microbes, which are microscopic organisms, including fungi, viruses, and bacteria. These organisms are so small that they can only be seen through a microscope. Bacteria are the microbes that most commonly cause UTIs. °SYMPTOMS  °Symptoms of UTIs may vary by age and gender of the patient and by the location of the infection. Symptoms in young women typically include a frequent and intense urge to urinate and a painful, burning feeling in the bladder or urethra during urination. Older women and men are more likely to be tired, shaky, and weak and have muscle aches and abdominal pain. A fever may mean the infection is in your kidneys. Other symptoms of a kidney infection include pain in your back or sides below the ribs, nausea, and vomiting. °DIAGNOSIS °To diagnose a UTI, your caregiver will ask you about your symptoms. Your caregiver will also ask you to provide a urine sample. The urine sample will be tested for bacteria and white blood cells. White blood cells are made by your body to help fight infection. °TREATMENT  °Typically, UTIs can be treated with medication. Because most UTIs are caused by a bacterial infection, they usually can be treated with the use of antibiotics. The choice of antibiotic and length of treatment depend on your symptoms and the type of bacteria  causing your infection. °HOME CARE INSTRUCTIONS °· If you were prescribed antibiotics, take them exactly as your caregiver instructs you. Finish the medication even if you feel better after you have only taken some of the medication. °· Drink enough water and fluids to keep your urine clear or pale yellow. °· Avoid caffeine, tea, and carbonated beverages. They tend to irritate your bladder. °· Empty your bladder often. Avoid holding urine for long periods of time. °· Empty your bladder before and after sexual intercourse. °· After a bowel movement, women should cleanse from front to back. Use each tissue only once. °SEEK MEDICAL CARE IF:  °· You have back pain. °· You develop a fever. °· Your symptoms do not begin to resolve within 3 days. °SEEK IMMEDIATE MEDICAL CARE IF:  °· You have severe back pain or lower abdominal pain. °· You develop chills. °· You have nausea or vomiting. °· You have continued burning or discomfort with urination. °MAKE SURE YOU:  °· Understand these instructions. °· Will watch your condition. °· Will get help right away if you are not doing well or get worse. °  °This information is not intended to replace advice given to you   by your health care provider. Make sure you discuss any questions you have with your health care provider. °  °Document Released: 04/11/2005 Document Revised: 03/23/2015 Document Reviewed: 08/10/2011 °Elsevier Interactive Patient Education ©2016 Elsevier Inc. ° °

## 2015-11-29 LAB — CULTURE, BLOOD (ROUTINE X 2)
Culture: NO GROWTH
Culture: NO GROWTH

## 2015-11-29 LAB — GLUCOSE, CAPILLARY: Glucose-Capillary: 133 mg/dL — ABNORMAL HIGH (ref 65–99)

## 2015-12-06 DIAGNOSIS — Z794 Long term (current) use of insulin: Secondary | ICD-10-CM | POA: Diagnosis not present

## 2015-12-06 DIAGNOSIS — E119 Type 2 diabetes mellitus without complications: Secondary | ICD-10-CM | POA: Diagnosis not present

## 2015-12-06 DIAGNOSIS — N390 Urinary tract infection, site not specified: Secondary | ICD-10-CM | POA: Diagnosis not present

## 2015-12-06 DIAGNOSIS — I255 Ischemic cardiomyopathy: Secondary | ICD-10-CM | POA: Diagnosis not present

## 2015-12-06 DIAGNOSIS — L719 Rosacea, unspecified: Secondary | ICD-10-CM | POA: Diagnosis not present

## 2015-12-06 DIAGNOSIS — I480 Paroxysmal atrial fibrillation: Secondary | ICD-10-CM | POA: Diagnosis not present

## 2015-12-06 DIAGNOSIS — B952 Enterococcus as the cause of diseases classified elsewhere: Secondary | ICD-10-CM | POA: Diagnosis not present

## 2015-12-06 DIAGNOSIS — I25810 Atherosclerosis of coronary artery bypass graft(s) without angina pectoris: Secondary | ICD-10-CM | POA: Diagnosis not present

## 2015-12-06 DIAGNOSIS — Z09 Encounter for follow-up examination after completed treatment for conditions other than malignant neoplasm: Secondary | ICD-10-CM | POA: Diagnosis not present

## 2015-12-20 ENCOUNTER — Ambulatory Visit (INDEPENDENT_AMBULATORY_CARE_PROVIDER_SITE_OTHER): Payer: Commercial Managed Care - HMO | Admitting: Urology

## 2015-12-20 ENCOUNTER — Encounter: Payer: Self-pay | Admitting: Urology

## 2015-12-20 VITALS — BP 153/82 | HR 60 | Ht 73.0 in | Wt 206.1 lb

## 2015-12-20 DIAGNOSIS — N401 Enlarged prostate with lower urinary tract symptoms: Secondary | ICD-10-CM | POA: Diagnosis not present

## 2015-12-20 DIAGNOSIS — B952 Enterococcus as the cause of diseases classified elsewhere: Secondary | ICD-10-CM | POA: Diagnosis not present

## 2015-12-20 DIAGNOSIS — N390 Urinary tract infection, site not specified: Secondary | ICD-10-CM | POA: Diagnosis not present

## 2015-12-20 DIAGNOSIS — R7881 Bacteremia: Secondary | ICD-10-CM

## 2015-12-20 DIAGNOSIS — I480 Paroxysmal atrial fibrillation: Secondary | ICD-10-CM | POA: Diagnosis not present

## 2015-12-20 DIAGNOSIS — N138 Other obstructive and reflux uropathy: Secondary | ICD-10-CM

## 2015-12-20 LAB — URINALYSIS, COMPLETE
Bilirubin, UA: NEGATIVE
Glucose, UA: NEGATIVE
Ketones, UA: NEGATIVE
Leukocytes, UA: NEGATIVE
Nitrite, UA: NEGATIVE
Protein, UA: NEGATIVE
RBC, UA: NEGATIVE
Specific Gravity, UA: 1.02 (ref 1.005–1.030)
Urobilinogen, Ur: 0.2 mg/dL (ref 0.2–1.0)
pH, UA: 5.5 (ref 5.0–7.5)

## 2015-12-20 LAB — MICROSCOPIC EXAMINATION: Bacteria, UA: NONE SEEN

## 2015-12-20 MED ORDER — TAMSULOSIN HCL 0.4 MG PO CAPS: 0.4000 mg | ORAL_CAPSULE | Freq: Every day | ORAL | Status: DC

## 2015-12-20 NOTE — Progress Notes (Signed)
12/20/2015 8:30 PM   JENSON BURNARD Oct 21, 1933 [id number]  Referring provider: Tracie Harrier, MD [address] [facility] [place], [place] [address]  Chief Complaint  Patient presents with  . Urinary Tract Infection    referred by ER    HPI: Patient is a 82 year old Caucasian male who follows up today after a hospital admission for bacteremia with enterococcus.    Patient presented to [facility] emergency department on 11/22/2015 complaining of syncopal episodes.  He had one while in the emergency room and therefore was admitted.  He was also losing control of his urine during the syncopal episodes.  He spiked a fever during the night of 11/23/2015.  He was discharged from hospital as preliminary blood cultures were negative.    Next day he was contacted and asked to return to the emergency room after his blood cultures returned positive for enterococcus.  He was still experiencing urinary incontinence, which he stated was abnormal for him.  Upon further questioning, it was reported that he was experiencing occasional chills for 1-2 weeks prior to his first admission. He was also experiencing frequency of urination, urgency, dysuria, nocturia, intermittency, hesitancy, straining to urinate and a weak urinary stream.  He was initiated on tamsulosin 0.4 mg during his hospital admission.  A CT renal stone study completed on 10/01/2015 for right lower quadrant pain did not identify any renal, ureteral or bladder stones.  Today, he states he is not experiencing any urinary symptoms. His urinalysis today is unremarkable. He states he had seen a urologist while living in [place] for kidney stones.   He may have had a microwave therapy on his prostate while in [place] 7 years ago. His I PSS score is 2/1.       IPSS      12/20/15 1400       International Prostate Symptom Score   How often have you had the sensation of not emptying your bladder? Not at All     How often have  you had to urinate less than every two hours? Less than 1 in 5 times     How often have you found you stopped and started again several times when you urinated? Not at All     How often have you found it difficult to postpone urination? Not at All     How often have you had a weak urinary stream? Not at All     How often have you had to strain to start urination? Not at All     How many times did you typically get up at night to urinate? 1 Time     Total IPSS Score 2     Quality of Life due to urinary symptoms   If you were to spend the rest of your life with your urinary condition just the way it is now how would you feel about that? Pleased        Score:  1-7 Mild 8-19 Moderate 20-35 Severe  He was on testosterone cypionate prescribed by his primary care physician, but it was not continued in the hospital.    PMH: Past Medical History  Diagnosis Date  . Diabetes mellitus without complication ([place])   . Hypertension   . Heart attack ([place])   . Pacemaker   . Renal disorder   . CAD (coronary artery disease)   . A-fib ([place])   . BPH (benign prostatic hyperplasia)   . Heartburn   .  Anxiety   . Arrhythmia   . Heart disease   . HLD (hyperlipidemia)   . Kidney stones   . History of stomach ulcers     Surgical History: Past Surgical History  Procedure Laterality Date  . Cholecystectomy    . Pacemaker insertion    . Tonsillectomy    . Coronary artery bypass graft    . Coronary angioplasty with stent placement    . Appendectomy    . [place] without cardioversion N/A 11/28/2015    Procedure: Transesophageal Echocardiogram ([place]);  Surgeon: Teodoro Spray, MD;  Location: [facility] ORS;  Service: Cardiovascular;  Laterality: N/A;  . Thyroid surgery    . Kidney stones    . Eyelids    . Nasal septum surgery    . Prostate heating      seven years ago    Home Medications:    Medication List       This list is accurate as of: 12/20/15 11:59 PM.  Always use your most recent med list.                 Alpha-Lipoic Acid 600 MG Caps  Take 600 mg by mouth daily.     ALPRAZolam 0.5 MG tablet  Commonly known as:  XANAX  Take 0.5 mg by mouth 3 (three) times daily as needed for anxiety.     amiodarone 200 MG tablet  Commonly known as:  PACERONE  Take 100 mg by mouth daily.     amoxicillin 500 MG capsule  Commonly known as:  AMOXIL  Take 2 capsules (1,000 mg total) by mouth every 12 (twelve) hours.     Co Q 10 10 MG Caps  Take 10 mg by mouth daily.     gabapentin 600 MG tablet  Commonly known as:  NEURONTIN  Take 1 tablet by mouth 3 (three) times daily.     HYDROcodone-acetaminophen 5-325 MG tablet  Commonly known as:  NORCO/VICODIN  Take 1 tablet by mouth daily as needed.     insulin glargine 100 unit/mL Sopn  Commonly known as:  LANTUS  Inject 25 Units into the skin at bedtime.     levothyroxine 100 MCG tablet  Commonly known as:  SYNTHROID, LEVOTHROID  Take 100 mcg by mouth daily before breakfast.     loperamide 2 MG capsule  Commonly known as:  IMODIUM  Take 2 mg by mouth as needed for diarrhea or loose stools.     metoprolol succinate 25 MG 24 hr tablet  Commonly known as:  TOPROL-XL     multivitamin with minerals Tabs tablet  Take 1 tablet by mouth daily.     niacinamide 500 MG tablet  Take by mouth. Reported on 12/20/2015     NOVOLOG FLEXPEN 100 UNIT/ML FlexPen  Generic drug:  insulin aspart  For Fingerstick 121 to 150 give 1 unit subcutanous injection; 151-200- 2 units; 201-250 3 units; 251-300 5 units; 301-350 7 units; greater than 351 9 units.     omeprazole 20 MG capsule  Commonly known as:  PRILOSEC  Take 20 mg by mouth daily.     Potassium Gluconate 550 (90 K) MG Tabs  Take 90 mg by mouth every other day.     pyridOXINE 50 MG tablet  Commonly known as:  B-6  Take 100 mg by mouth daily.     spironolactone 25 MG tablet  Commonly known as:  ALDACTONE     tamsulosin 0.4 MG Caps capsule  Commonly known as:  FLOMAX  Take 1 capsule  (0.4 mg total) by mouth daily.     testosterone cypionate 200 MG/ML injection  Commonly known as:  DEPOTESTOSTERONE CYPIONATE  Inject into the muscle. Reported on 12/20/2015     VOLTAREN 1 % Gel  Generic drug:  diclofenac sodium  Reported on 12/20/2015     warfarin 2 MG tablet  Commonly known as:  COUMADIN  Take 1 tablet (2 mg total) by mouth daily at 6 PM.        Allergies:  Allergies  Allergen Reactions  . Ciprofloxacin   . Levaquin [Levofloxacin] Swelling    Reaction: swelling of throat   . Lidocaine Other (See Comments)    Unspecified  . Metformin Other (See Comments)    Esophageal Spasms  . Tannic Acid Hives    Family History: Family History  Problem Relation Age of Onset  . CAD    . Diabetes    . Hypertension    . Kidney disease Neg Hx   . Prostate cancer Neg Hx     Social History:  reports that he has quit smoking. He does not have any smokeless tobacco history on file. He reports that he drinks alcohol. He reports that he does not use illicit drugs.  ROS: UROLOGY Frequent Urination?: No Hard to postpone urination?: No Burning/pain with urination?: No Get up at night to urinate?: No Leakage of urine?: No Urine stream starts and stops?: No Trouble starting stream?: No Do you have to strain to urinate?: No Blood in urine?: No Urinary tract infection?: No Sexually transmitted disease?: No Injury to kidneys or bladder?: No Painful intercourse?: No Weak stream?: No Erection problems?: Yes Penile pain?: No  Gastrointestinal Nausea?: No Vomiting?: No Indigestion/heartburn?: No Diarrhea?: Yes Constipation?: No  Constitutional Fever: Yes Night sweats?: Yes Weight loss?: Yes Fatigue?: Yes  Skin Skin rash/lesions?: No Itching?: No  Eyes Blurred vision?: No Double vision?: No  Ears/Nose/Throat Sore throat?: No Sinus problems?: No  Hematologic/Lymphatic Swollen glands?: No Easy bruising?: No  Cardiovascular Leg swelling?: No Chest  pain?: No  Respiratory Cough?: Yes Shortness of breath?: No  Endocrine Excessive thirst?: No  Musculoskeletal Back pain?: Yes Joint pain?: Yes  Neurological Headaches?: Yes Dizziness?: Yes  Psychologic Depression?: No Anxiety?: No  Physical Exam: BP 153/82 mmHg  Pulse 60  Ht 6\' 1"  (1.854 m)  Wt 206 lb 1.6 oz (93.486 kg)  BMI 27.20 kg/m2  Constitutional: Well nourished. Alert and oriented, No acute distress. HEENT: [place] AT, moist mucus membranes. Trachea midline, no masses. Cardiovascular: No clubbing, cyanosis, or edema. Respiratory: Normal respiratory effort, no increased work of breathing. GI: Abdomen is soft, non tender, non distended, no abdominal masses. Liver and spleen not palpable.  No hernias appreciated.  Stool sample for occult testing is not indicated.   GU: No CVA tenderness.  No bladder fullness or masses.  Patient with circumcised phallus.   Urethral meatus is patent.  No penile discharge. No penile lesions or rashes. Scrotum without lesions, cysts, rashes and/or edema.  Testicles are located scrotally bilaterally. No masses are appreciated in the testicles. Left and right epididymis are normal. Rectal: Patient with  normal sphincter tone. Anus and perineum without scarring or rashes. No rectal masses are appreciated. Prostate is approximately 55 grams, no nodules are appreciated. Seminal vesicles are normal. Skin: No rashes, bruises or suspicious lesions. Lymph: No cervical or inguinal adenopathy. Neurologic: Grossly intact, no focal deficits, moving all 4 extremities. Psychiatric: Normal mood and affect.  Laboratory Data: Lab  Results  Component Value Date   WBC 6.2 11/26/2015   HGB 12.3* 11/26/2015   HCT 36.9* 11/26/2015   MCV 93.3 11/26/2015   PLT 136* 11/26/2015    Lab Results  Component Value Date   CREATININE 0.78 11/26/2015     Lab Results  Component Value Date   HGBA1C 6.9* 11/22/2015      Lab Results  Component Value Date   AST  19 11/24/2015   Lab Results  Component Value Date   ALT 14* 11/24/2015     Urinalysis Results for orders placed or performed in visit on 12/20/15  Microscopic Examination  Result Value Ref Range   WBC, UA 0-5 0 -  5 /hpf   RBC, UA 0-2 0 -  2 /hpf   Epithelial Cells (non renal) 0-10 0 - 10 /hpf   Bacteria, UA None seen None seen/Few  Urinalysis, Complete  Result Value Ref Range   Specific Gravity, UA 1.020 1.005 - 1.030   pH, UA 5.5 5.0 - 7.5   Color, UA Yellow Yellow   Appearance Ur Clear Clear   Leukocytes, UA Negative Negative   Protein, UA Negative Negative/Trace   Glucose, UA Negative Negative   Ketones, UA Negative Negative   RBC, UA Negative Negative   Bilirubin, UA Negative Negative   Urobilinogen, Ur 0.2 0.2 - 1.0 mg/dL   Nitrite, UA Negative Negative   Microscopic Examination See below:     Pertinent Imaging: CLINICAL DATA: Back pain x 6 days, today right flank pain, hx of kidney -stones, appendectomy and chole.  EXAM: CT ABDOMEN AND PELVIS WITHOUT CONTRAST  TECHNIQUE: Multidetector CT imaging of the abdomen and pelvis was performed following the standard protocol without IV contrast.  COMPARISON: None.  FINDINGS: Lower chest: No pulmonary nodules, pleural effusions, or infiltrates. Heart is enlarged. AICD lead to the right ventricle. No pericardial effusion. Coronary artery calcifications present.  Upper abdomen: No intrarenal or ureteral stones are identified. Small cysts are identified within the left kidney. No solid mass identified within either kidney.  No focal abnormality identified within the liver, pancreas, or adrenal glands. Calcified granulomata are identified within the spleen, consistent with prior granulomatous disease. Status post cholecystectomy.  Gastrointestinal tract: The stomach and small bowel loops are normal in appearance. Colonic loops are normal in appearance. There is moderate stool throughout the colon. Stool  is also identified within the distal loops of small bowel. Appendix is absent.  Pelvis: Urinary bladder has normal appearance. The prostate gland is enlarged. Seminal vesicles are normal in appearance. No free pelvic fluid.  Retroperitoneum: There is moderate atherosclerosis of the abdominal aorta. No aneurysm. No retroperitoneal or mesenteric adenopathy.  Abdominal wall: Unremarkable.  Osseous structures: Moderate lumbar spondylosis. 6 mm retrolisthesis L5 on S1. No suspicious lytic or blastic lesions are identified.  IMPRESSION: 1. Significant stool burden, possibly accounting for the patient's right lower quadrant pain. Stool is identified throughout the colon and within the distal normal appearing small bowel loops. 2. No evidence for intrarenal or ureteral stones. 3. Status post appendectomy. 4. Status post cholecystectomy. 5. Cardiomegaly and coronary artery disease. 6. Small left renal cyst. 7. Remote granulomatous disease; granulomata within the spleen. 8. Atherosclerotic disease of the abdominal aorta. 9. Lumbar spondylosis.   Electronically Signed  By: Nolon Nations M.D.  On: 10/01/2015 17:44   Assessment & Plan:    1. Urinary tract infection without hematuria, site unspecified:   Patient was found to have significant stone burden on a CT completed  on 10/01/2015. This coupled with BPH with LUTS may have resulted in the development of urinary tract infection.  The patient delayed seeking care for 2 weeks even though he was experiencing a worsening of his urinary symptoms with chills.  It wasn't until he became syncopal that he seeked medical attention.  This delay in seeking treatment most likely allowed the bacteria to form in his bloodstream.  Patient is voiding well now on the tamsulosin and his UA is clear. He will contact us if he should experience any worsening of his urinary symptoms.  - Urinalysis, Complete  2. Bacteriemia:   Resolved.    3. BPH  with LUTS:     -IPSS score is 2/1    -Continue tamsulosin 0.4 mg daily   -RTC in 12 months for IPSS and exam   Return in about 1 year (around 12/19/2016) for IPSS and exam.  These notes generated with voice recognition software. I apologize for typographical errors.  Zara Council, [facility] [address], [place], [place] [address] [phone]

## 2015-12-25 DIAGNOSIS — N138 Other obstructive and reflux uropathy: Secondary | ICD-10-CM | POA: Insufficient documentation

## 2015-12-25 DIAGNOSIS — N401 Enlarged prostate with lower urinary tract symptoms: Secondary | ICD-10-CM

## 2016-01-03 DIAGNOSIS — I480 Paroxysmal atrial fibrillation: Secondary | ICD-10-CM | POA: Diagnosis not present

## 2016-01-05 DIAGNOSIS — I480 Paroxysmal atrial fibrillation: Secondary | ICD-10-CM | POA: Diagnosis not present

## 2016-01-05 DIAGNOSIS — I952 Hypotension due to drugs: Secondary | ICD-10-CM | POA: Diagnosis not present

## 2016-01-05 DIAGNOSIS — I255 Ischemic cardiomyopathy: Secondary | ICD-10-CM | POA: Diagnosis not present

## 2016-01-05 DIAGNOSIS — I25810 Atherosclerosis of coronary artery bypass graft(s) without angina pectoris: Secondary | ICD-10-CM | POA: Diagnosis not present

## 2016-01-19 DIAGNOSIS — I5033 Acute on chronic diastolic (congestive) heart failure: Secondary | ICD-10-CM | POA: Diagnosis not present

## 2016-01-19 DIAGNOSIS — I480 Paroxysmal atrial fibrillation: Secondary | ICD-10-CM | POA: Diagnosis not present

## 2016-01-26 ENCOUNTER — Encounter: Payer: Self-pay | Admitting: *Deleted

## 2016-01-27 ENCOUNTER — Ambulatory Visit
Admission: RE | Admit: 2016-01-27 | Discharge: 2016-01-27 | Disposition: A | Discharge status: Home / Self Care | Payer: Commercial Managed Care - HMO | Source: Ambulatory Visit | Attending: Unknown Physician Specialty | Admitting: Unknown Physician Specialty

## 2016-01-27 ENCOUNTER — Encounter: Payer: Self-pay | Admitting: Anesthesiology

## 2016-01-27 ENCOUNTER — Ambulatory Visit: Payer: Commercial Managed Care - HMO | Admitting: Certified Registered Nurse Anesthetist

## 2016-01-27 ENCOUNTER — Encounter
Admission: RE | Disposition: A | Discharge status: Home / Self Care | Payer: Self-pay | Source: Ambulatory Visit | Attending: Unknown Physician Specialty

## 2016-01-27 DIAGNOSIS — Z8249 Family history of ischemic heart disease and other diseases of the circulatory system: Secondary | ICD-10-CM | POA: Insufficient documentation

## 2016-01-27 DIAGNOSIS — E039 Hypothyroidism, unspecified: Secondary | ICD-10-CM | POA: Insufficient documentation

## 2016-01-27 DIAGNOSIS — I4891 Unspecified atrial fibrillation: Secondary | ICD-10-CM | POA: Insufficient documentation

## 2016-01-27 DIAGNOSIS — I255 Ischemic cardiomyopathy: Secondary | ICD-10-CM | POA: Insufficient documentation

## 2016-01-27 DIAGNOSIS — D122 Benign neoplasm of ascending colon: Secondary | ICD-10-CM | POA: Insufficient documentation

## 2016-01-27 DIAGNOSIS — E119 Type 2 diabetes mellitus without complications: Secondary | ICD-10-CM | POA: Diagnosis not present

## 2016-01-27 DIAGNOSIS — Z794 Long term (current) use of insulin: Secondary | ICD-10-CM | POA: Insufficient documentation

## 2016-01-27 DIAGNOSIS — Z7901 Long term (current) use of anticoagulants: Secondary | ICD-10-CM | POA: Insufficient documentation

## 2016-01-27 DIAGNOSIS — E785 Hyperlipidemia, unspecified: Secondary | ICD-10-CM | POA: Insufficient documentation

## 2016-01-27 DIAGNOSIS — K648 Other hemorrhoids: Secondary | ICD-10-CM | POA: Diagnosis not present

## 2016-01-27 DIAGNOSIS — I252 Old myocardial infarction: Secondary | ICD-10-CM | POA: Diagnosis not present

## 2016-01-27 DIAGNOSIS — K635 Polyp of colon: Secondary | ICD-10-CM | POA: Diagnosis not present

## 2016-01-27 DIAGNOSIS — Z950 Presence of cardiac pacemaker: Secondary | ICD-10-CM | POA: Insufficient documentation

## 2016-01-27 DIAGNOSIS — K640 First degree hemorrhoids: Secondary | ICD-10-CM | POA: Insufficient documentation

## 2016-01-27 DIAGNOSIS — Z87891 Personal history of nicotine dependence: Secondary | ICD-10-CM | POA: Insufficient documentation

## 2016-01-27 DIAGNOSIS — I25810 Atherosclerosis of coronary artery bypass graft(s) without angina pectoris: Secondary | ICD-10-CM | POA: Diagnosis not present

## 2016-01-27 DIAGNOSIS — Z87442 Personal history of urinary calculi: Secondary | ICD-10-CM | POA: Insufficient documentation

## 2016-01-27 DIAGNOSIS — K621 Rectal polyp: Secondary | ICD-10-CM | POA: Insufficient documentation

## 2016-01-27 DIAGNOSIS — I10 Essential (primary) hypertension: Secondary | ICD-10-CM | POA: Diagnosis not present

## 2016-01-27 DIAGNOSIS — E291 Testicular hypofunction: Secondary | ICD-10-CM | POA: Insufficient documentation

## 2016-01-27 DIAGNOSIS — I2510 Atherosclerotic heart disease of native coronary artery without angina pectoris: Secondary | ICD-10-CM | POA: Insufficient documentation

## 2016-01-27 DIAGNOSIS — Z1211 Encounter for screening for malignant neoplasm of colon: Secondary | ICD-10-CM | POA: Insufficient documentation

## 2016-01-27 DIAGNOSIS — Z951 Presence of aortocoronary bypass graft: Secondary | ICD-10-CM | POA: Insufficient documentation

## 2016-01-27 DIAGNOSIS — D128 Benign neoplasm of rectum: Secondary | ICD-10-CM | POA: Diagnosis not present

## 2016-01-27 DIAGNOSIS — Z8719 Personal history of other diseases of the digestive system: Secondary | ICD-10-CM | POA: Insufficient documentation

## 2016-01-27 DIAGNOSIS — Z833 Family history of diabetes mellitus: Secondary | ICD-10-CM | POA: Insufficient documentation

## 2016-01-27 DIAGNOSIS — Z79899 Other long term (current) drug therapy: Secondary | ICD-10-CM | POA: Insufficient documentation

## 2016-01-27 DIAGNOSIS — Z888 Allergy status to other drugs, medicaments and biological substances status: Secondary | ICD-10-CM | POA: Insufficient documentation

## 2016-01-27 DIAGNOSIS — F419 Anxiety disorder, unspecified: Secondary | ICD-10-CM | POA: Insufficient documentation

## 2016-01-27 DIAGNOSIS — Z955 Presence of coronary angioplasty implant and graft: Secondary | ICD-10-CM | POA: Insufficient documentation

## 2016-01-27 DIAGNOSIS — N400 Enlarged prostate without lower urinary tract symptoms: Secondary | ICD-10-CM | POA: Diagnosis not present

## 2016-01-27 DIAGNOSIS — Z86010 Personal history of colonic polyps: Secondary | ICD-10-CM | POA: Diagnosis not present

## 2016-01-27 HISTORY — PX: COLONOSCOPY WITH PROPOFOL: SHX5780

## 2016-01-27 HISTORY — DX: Personal history of urinary calculi: Z87.442

## 2016-01-27 HISTORY — DX: Hypothyroidism, unspecified: E03.9

## 2016-01-27 HISTORY — DX: Personal history of other diseases of the digestive system: Z87.19

## 2016-01-27 HISTORY — DX: Endocrine disorder, unspecified: E34.9

## 2016-01-27 HISTORY — DX: Ischemic cardiomyopathy: I25.5

## 2016-01-27 HISTORY — DX: Rosacea, unspecified: L71.9

## 2016-01-27 HISTORY — DX: Tremor, unspecified: R25.1

## 2016-01-27 HISTORY — DX: Allergy, unspecified, initial encounter: T78.40XA

## 2016-01-27 HISTORY — DX: Other postherpetic nervous system involvement: B02.29

## 2016-01-27 HISTORY — DX: Trigeminal neuralgia: G50.0

## 2016-01-27 LAB — GLUCOSE, CAPILLARY: Glucose-Capillary: 109 mg/dL — ABNORMAL HIGH (ref 65–99)

## 2016-01-27 SURGERY — COLONOSCOPY WITH PROPOFOL
Anesthesia: General

## 2016-01-27 MED ORDER — PROPOFOL 500 MG/50ML IV EMUL
INTRAVENOUS | Status: DC | PRN
  Administered 2016-01-27: 120 ug/kg/min via INTRAVENOUS

## 2016-01-27 MED ORDER — SODIUM CHLORIDE 0.9 % IV SOLN
INTRAVENOUS | Status: DC
  Administered 2016-01-27: 1000 mL via INTRAVENOUS

## 2016-01-27 MED ORDER — SODIUM CHLORIDE 0.9 % IV SOLN: INTRAVENOUS | Status: DC

## 2016-01-27 MED ORDER — PROPOFOL 10 MG/ML IV BOLUS
INTRAVENOUS | Status: DC | PRN
  Administered 2016-01-27: 70 mg via INTRAVENOUS
  Administered 2016-01-27: 20 mg via INTRAVENOUS
  Administered 2016-01-27: 10 mg via INTRAVENOUS

## 2016-01-27 NOTE — Anesthesia Preprocedure Evaluation (Signed)
Anesthesia Evaluation  Patient identified by MRN, date of birth, ID band Patient awake    Reviewed: Allergy & Precautions, H&P , NPO status , Patient's Chart, lab work & pertinent test results, reviewed documented beta blocker date and time   History of Anesthesia Complications Negative for: history of anesthetic complications  Airway Mallampati: III  TM Distance: >3 FB Neck ROM: full    Dental no notable dental hx. (+) Caps, Missing, Teeth Intact   Pulmonary neg shortness of breath, neg sleep apnea, neg COPD, neg recent URI, former smoker,    Pulmonary exam normal breath sounds clear to auscultation       Cardiovascular Exercise Tolerance: Good hypertension, On Medications and On Home Beta Blockers (-) angina+ CAD, + Past MI, + Cardiac Stents, + CABG and +CHF (One episode in the past)  (-) Peripheral Vascular Disease Normal cardiovascular exam+ dysrhythmias Atrial Fibrillation + pacemaker + Cardiac Defibrillator (-) Valvular Problems/Murmurs Rhythm:regular Rate:Normal     Neuro/Psych neg Seizures  Neuromuscular disease (post-herpetic neuralgia) negative psych ROS   GI/Hepatic Neg liver ROS, GERD  ,  Endo/Other  diabetes, Insulin DependentHypothyroidism   Renal/GU Renal disease (kidney stones)  negative genitourinary   Musculoskeletal   Abdominal   Peds  Hematology negative hematology ROS (+)   Anesthesia Other Findings Past Medical History:   Diabetes mellitus without complication ([place])                 Hypertension                                                 Heart attack ([place])                                           Pacemaker                                                    Renal disorder                                               CAD (coronary artery disease)                                A-fib ([facility])                                                  BPH (benign prostatic hyperplasia)                            Heartburn  Anxiety                                                      Heart disease                                                HLD (hyperlipidemia)                                         Kidney stones                                                History of stomach ulcers                                    Allergic state                                               Arrhythmia                                                     Comment:atrial fib   H/O diverticulitis of colon                                  Hypothyroidism                                               Ischemic cardiomyopathy                                      History of kidney stones                                     Post herpetic neuralgia                                        Comment:left side of face   Rosacea  Testosterone deficiency                                      Tremors of nervous system                                    Trigeminal neuralgia of left side of face                    Reproductive/Obstetrics negative OB ROS                             Anesthesia Physical Anesthesia Plan  ASA: III  Anesthesia Plan: General   Post-op Pain Management:    Induction:   Airway Management Planned:   Additional Equipment:   Intra-op Plan:   Post-operative Plan:   Informed Consent: I have reviewed the patients History and Physical, chart, labs and discussed the procedure including the risks, benefits and alternatives for the proposed anesthesia with the patient or authorized representative who has indicated his/her understanding and acceptance.   Dental Advisory Given  Plan Discussed with: Anesthesiologist, CRNA and Surgeon  Anesthesia Plan Comments:         Anesthesia Quick Evaluation

## 2016-01-27 NOTE — Anesthesia Postprocedure Evaluation (Signed)
Anesthesia Post Note  Patient: Dakota Thomas  Procedure(s) Performed: Procedure(s) (LRB): COLONOSCOPY WITH PROPOFOL (N/A)  Patient location during evaluation: Endoscopy Anesthesia Type: General Level of consciousness: awake and alert Pain management: pain level controlled Vital Signs Assessment: post-procedure vital signs reviewed and stable Respiratory status: spontaneous breathing, nonlabored ventilation, respiratory function stable and patient connected to nasal cannula oxygen Cardiovascular status: blood pressure returned to baseline and stable Postop Assessment: no signs of nausea or vomiting Anesthetic complications: no    Last Vitals:  Filed Vitals:   01/27/16 0900 01/27/16 0910  BP: 94/61 115/77  Pulse: 58   Temp:    Resp: 15     Last Pain: There were no vitals filed for this visit.               Martha Clan

## 2016-01-27 NOTE — H&P (Signed)
Primary Care Physician:  Tracie Harrier, MD Primary Gastroenterologist:  Dr. Vira Agar  Pre-Procedure History & Physical: HPI:  Dakota Thomas is a 82 y.o. male is here for an colonoscopy.   Past Medical History  Diagnosis Date  . Diabetes mellitus without complication ([place])   . Hypertension   . Heart attack ([place])   . Pacemaker   . Renal disorder   . CAD (coronary artery disease)   . A-fib ([place])   . BPH (benign prostatic hyperplasia)   . Heartburn   . Anxiety   . Heart disease   . HLD (hyperlipidemia)   . Kidney stones   . History of stomach ulcers   . Allergic state   . Arrhythmia     atrial fib  . H/O diverticulitis of colon   . Hypothyroidism   . Ischemic cardiomyopathy   . History of kidney stones   . Post herpetic neuralgia     left side of face  . Rosacea   . Testosterone deficiency   . Tremors of nervous system   . Trigeminal neuralgia of left side of face     Past Surgical History  Procedure Laterality Date  . Cholecystectomy    . Pacemaker insertion    . Tonsillectomy    . Coronary artery bypass graft    . Coronary angioplasty with stent placement    . Appendectomy    . Tee without cardioversion N/A 11/28/2015    Procedure: Transesophageal Echocardiogram (Tee);  Surgeon: Teodoro Spray, MD;  Location: [facility] ORS;  Service: Cardiovascular;  Laterality: N/A;  . Thyroid surgery    . Kidney stones    . Eyelids    . Nasal septum surgery    . Prostate heating      seven years ago  . Back surgery      cervical diskectomy  . Ostate surgery      Prior to Admission medications   Medication Sig Start Date End Date Taking? Authorizing Provider  Alpha-Lipoic Acid 600 MG CAPS Take 600 mg by mouth daily.   Yes Historical Provider, MD  ALPRAZolam Duanne Moron) 0.5 MG tablet Take 0.5 mg by mouth 3 (three) times daily as needed for anxiety.   Yes Historical Provider, MD  amiodarone (PACERONE) 200 MG tablet Take 100 mg by mouth daily.   Yes Historical Provider, MD   atorvastatin (LIPITOR) 40 MG tablet Take 40 mg by mouth daily.   Yes Historical Provider, MD  Coenzyme Q10 (CO Q 10) 10 MG CAPS Take 10 mg by mouth daily.   Yes Historical Provider, MD  gabapentin (NEURONTIN) 600 MG tablet Take 1 tablet by mouth 3 (three) times daily. 10/10/15  Yes Historical Provider, MD  HYDROcodone-acetaminophen (NORCO/VICODIN) 5-325 MG tablet Take 1 tablet by mouth daily as needed. 10/12/15  Yes Historical Provider, MD  insulin glargine (LANTUS) 100 unit/mL SOPN Inject 25 Units into the skin at bedtime.   Yes Historical Provider, MD  levothyroxine (SYNTHROID, LEVOTHROID) 100 MCG tablet Take 100 mcg by mouth daily before breakfast.   Yes Historical Provider, MD  loperamide (IMODIUM) 2 MG capsule Take 2 mg by mouth as needed for diarrhea or loose stools.   Yes Historical Provider, MD  metoprolol succinate (TOPROL-XL) 25 MG 24 hr tablet  10/10/15  Yes Historical Provider, MD  Multiple Vitamin (MULTIVITAMIN WITH MINERALS) TABS tablet Take 1 tablet by mouth daily.   Yes Historical Provider, MD  niacinamide 500 MG tablet Take by mouth. Reported on 12/20/2015   Yes Historical  Provider, MD  NOVOLOG FLEXPEN 100 UNIT/ML FlexPen For Fingerstick 121 to 150 give 1 unit subcutanous injection; 151-200- 2 units; 201-250 3 units; 251-300 5 units; 301-350 7 units; greater than 351 9 units. 11/28/15  Yes Richard Leslye Peer, MD  omeprazole (PRILOSEC) 20 MG capsule Take 20 mg by mouth daily.   Yes Historical Provider, MD  Potassium Gluconate 550 (90 K) MG TABS Take 90 mg by mouth every other day.   Yes Historical Provider, MD  pyridOXINE (B-6) 50 MG tablet Take 100 mg by mouth daily.   Yes Historical Provider, MD  spironolactone (ALDACTONE) 25 MG tablet  10/10/15  Yes Historical Provider, MD  tamsulosin (FLOMAX) 0.4 MG CAPS capsule Take 1 capsule (0.4 mg total) by mouth daily. 12/20/15  Yes Shannon A McGowan, PA-C  testosterone cypionate (DEPOTESTOSTERONE CYPIONATE) 200 MG/ML injection Inject into the muscle.  Reported on 12/20/2015 06/22/15  Yes Historical Provider, MD  VOLTAREN 1 % GEL Reported on 12/20/2015 10/04/15  Yes Historical Provider, MD  warfarin (COUMADIN) 2 MG tablet Take 1 tablet (2 mg total) by mouth daily at 6 PM. 11/28/15  Yes Loletha Grayer, MD  amoxicillin (AMOXIL) 500 MG capsule Take 2 capsules (1,000 mg total) by mouth every 12 (twelve) hours. Patient not taking: Reported on 12/20/2015 11/28/15   Loletha Grayer, MD    Allergies as of 01/02/2016 - Review Complete 12/20/2015  Allergen Reaction Noted  . Ciprofloxacin  12/25/2009  . Levaquin [levofloxacin] Swelling 11/24/2015  . Lidocaine Other (See Comments) 12/20/2015  . Metformin Other (See Comments) 12/20/2015  . Tannic acid Hives 11/24/2015    Family History  Problem Relation Age of Onset  . CAD    . Diabetes    . Hypertension    . Kidney disease Neg Hx   . Prostate cancer Neg Hx     Social History   Social History  . Marital Status: Married    Spouse Name: N/A  . Number of Children: N/A  . Years of Education: N/A   Occupational History  . Not on file.   Social History Main Topics  . Smoking status: Former Smoker    Types: Cigarettes  . Smokeless tobacco: Never Used  . Alcohol Use: Yes  . Drug Use: No  . Sexual Activity: Not on file   Other Topics Concern  . Not on file   Social History Narrative    Review of Systems: See HPI, otherwise negative ROS  Physical Exam: BP 130/67 mmHg  Pulse 65  Temp(Src) 97.2 F (36.2 C) (Tympanic)  Resp 18  Ht 6\' 1"  (1.854 m)  Wt 87.544 kg (193 lb)  BMI 25.47 kg/m2  SpO2 100% General:   Alert,  pleasant and cooperative in NAD Head:  Normocephalic and atraumatic. Neck:  Supple; no masses or thyromegaly. Lungs:  Clear throughout to auscultation.    Heart:  Regular rate and rhythm. Abdomen:  Soft, nontender and nondistended. Normal bowel sounds, without guarding, and without rebound.   Neurologic:  Alert and  oriented x4;  grossly normal  neurologically.  Impression/Plan: Dakota Thomas is here for an colonoscopy to be performed for [facility] colon polyps  Risks, benefits, limitations, and alternatives regarding  colonoscopy have been reviewed with the patient.  Questions have been answered.  All parties agreeable.   Gaylyn Cheers, MD  01/27/2016, 8:21 AM

## 2016-01-27 NOTE — Op Note (Signed)
[facility] Gastroenterology Patient Name: Dakota Thomas Procedure Date: 01/27/2016 8:08 AM MRN: [id number] Account #: [id number] Date of Birth: May 19, 1934 Admit Type: Outpatient Age: 82 Room: [facility] ENDO ROOM 3 Gender: Male Note Status: Finalized Procedure:            Colonoscopy Indications:          High risk colon cancer surveillance: Personal history                        of colonic polyps Providers:            Manya Silvas, MD Referring MD:         Tracie Harrier, MD (Referring MD) Medicines:            Propofol per Anesthesia Complications:        No immediate complications. Procedure:            Pre-Anesthesia Assessment:                       - After reviewing the risks and benefits, the patient                        was deemed in satisfactory condition to undergo the                        procedure.                       After obtaining informed consent, the colonoscope was                        passed under direct vision. Throughout the procedure,                        the patient's blood pressure, pulse, and oxygen                        saturations were monitored continuously. The                        Colonoscope was introduced through the anus and                        advanced to the the cecum, identified by appendiceal                        orifice and ileocecal valve. The colonoscopy was                        performed without difficulty. The patient tolerated the                        procedure well. The quality of the bowel preparation                        was excellent. Findings:      Three sessile polyps were found in the rectum and ascending colon. The       polyps were diminutive in size. These polyps were removed with a jumbo       cold forceps. Resection and retrieval were complete.      A  small polyp was found in the ascending colon. The polyp was sessile.       The polyp was removed with a cold snare. Resection and  retrieval were       complete.      The exam was otherwise without abnormality.      Internal hemorrhoids were found during endoscopy. The hemorrhoids were       small and Grade I (internal hemorrhoids that do not prolapse). Impression:           - Three diminutive polyps in the rectum and in the                        ascending colon, removed with a jumbo cold forceps.                        Resected and retrieved.                       - One small polyp in the ascending colon, removed with                        a cold snare. Resected and retrieved.                       - The examination was otherwise normal.                       - Internal hemorrhoids. Recommendation:       - Await pathology results. Manya Silvas, MD 01/27/2016 8:47:45 AM This report has been signed electronically. Number of Addenda: 0 Note Initiated On: 01/27/2016 8:08 AM Scope Withdrawal Time: 0 hours 8 minutes 35 seconds  Total Procedure Duration: 0 hours 15 minutes 41 seconds       [facility]

## 2016-01-27 NOTE — Transfer of Care (Signed)
Immediate Anesthesia Transfer of Care Note  Patient: Dakota Thomas  Procedure(s) Performed: Procedure(s): COLONOSCOPY WITH PROPOFOL (N/A)  Patient Location: PACU  Anesthesia Type:General  Level of Consciousness: awake, alert  Airway & Oxygen Therapy: Patient Spontanous Breathing and Patient connected to nasal cannula oxygen  Post-op Assessment: Report given to RN and Post -op Vital signs reviewed and stable  Post vital signs: Reviewed and stable  Last Vitals:  Filed Vitals:   01/27/16 0743  BP: 130/67  Pulse: 65  Temp: 36.2 C  Resp: 18    Last Pain: There were no vitals filed for this visit.       Complications: No apparent anesthesia complications

## 2016-01-29 ENCOUNTER — Encounter: Payer: Self-pay | Admitting: Unknown Physician Specialty

## 2016-01-30 LAB — SURGICAL PATHOLOGY

## 2016-02-09 DIAGNOSIS — I480 Paroxysmal atrial fibrillation: Secondary | ICD-10-CM | POA: Diagnosis not present

## 2016-02-17 DIAGNOSIS — I480 Paroxysmal atrial fibrillation: Secondary | ICD-10-CM | POA: Diagnosis not present

## 2016-02-28 DIAGNOSIS — I472 Ventricular tachycardia: Secondary | ICD-10-CM | POA: Diagnosis not present

## 2016-03-01 DIAGNOSIS — Z794 Long term (current) use of insulin: Secondary | ICD-10-CM | POA: Diagnosis not present

## 2016-03-01 DIAGNOSIS — I480 Paroxysmal atrial fibrillation: Secondary | ICD-10-CM | POA: Diagnosis not present

## 2016-03-01 DIAGNOSIS — E119 Type 2 diabetes mellitus without complications: Secondary | ICD-10-CM | POA: Diagnosis not present

## 2016-03-01 DIAGNOSIS — B952 Enterococcus as the cause of diseases classified elsewhere: Secondary | ICD-10-CM | POA: Diagnosis not present

## 2016-03-01 DIAGNOSIS — L719 Rosacea, unspecified: Secondary | ICD-10-CM | POA: Diagnosis not present

## 2016-03-01 DIAGNOSIS — I255 Ischemic cardiomyopathy: Secondary | ICD-10-CM | POA: Diagnosis not present

## 2016-03-01 DIAGNOSIS — Z09 Encounter for follow-up examination after completed treatment for conditions other than malignant neoplasm: Secondary | ICD-10-CM | POA: Diagnosis not present

## 2016-03-01 DIAGNOSIS — I25810 Atherosclerosis of coronary artery bypass graft(s) without angina pectoris: Secondary | ICD-10-CM | POA: Diagnosis not present

## 2016-03-01 DIAGNOSIS — N390 Urinary tract infection, site not specified: Secondary | ICD-10-CM | POA: Diagnosis not present

## 2016-03-08 DIAGNOSIS — Z794 Long term (current) use of insulin: Secondary | ICD-10-CM | POA: Diagnosis not present

## 2016-03-08 DIAGNOSIS — I255 Ischemic cardiomyopathy: Secondary | ICD-10-CM | POA: Diagnosis not present

## 2016-03-08 DIAGNOSIS — E1140 Type 2 diabetes mellitus with diabetic neuropathy, unspecified: Secondary | ICD-10-CM | POA: Diagnosis not present

## 2016-03-08 DIAGNOSIS — E119 Type 2 diabetes mellitus without complications: Secondary | ICD-10-CM | POA: Diagnosis not present

## 2016-03-08 DIAGNOSIS — I472 Ventricular tachycardia: Secondary | ICD-10-CM | POA: Diagnosis not present

## 2016-03-08 DIAGNOSIS — I25810 Atherosclerosis of coronary artery bypass graft(s) without angina pectoris: Secondary | ICD-10-CM | POA: Diagnosis not present

## 2016-03-08 DIAGNOSIS — Z23 Encounter for immunization: Secondary | ICD-10-CM | POA: Diagnosis not present

## 2016-03-08 DIAGNOSIS — E039 Hypothyroidism, unspecified: Secondary | ICD-10-CM | POA: Diagnosis not present

## 2016-03-14 DIAGNOSIS — Z794 Long term (current) use of insulin: Secondary | ICD-10-CM | POA: Diagnosis not present

## 2016-03-14 DIAGNOSIS — E1140 Type 2 diabetes mellitus with diabetic neuropathy, unspecified: Secondary | ICD-10-CM | POA: Diagnosis not present

## 2016-03-14 DIAGNOSIS — R69 Illness, unspecified: Secondary | ICD-10-CM | POA: Diagnosis not present

## 2016-03-14 DIAGNOSIS — E119 Type 2 diabetes mellitus without complications: Secondary | ICD-10-CM | POA: Diagnosis not present

## 2016-03-27 DIAGNOSIS — I4891 Unspecified atrial fibrillation: Secondary | ICD-10-CM | POA: Diagnosis not present

## 2016-03-29 DIAGNOSIS — L821 Other seborrheic keratosis: Secondary | ICD-10-CM | POA: Diagnosis not present

## 2016-03-29 DIAGNOSIS — L570 Actinic keratosis: Secondary | ICD-10-CM | POA: Diagnosis not present

## 2016-03-29 DIAGNOSIS — D229 Melanocytic nevi, unspecified: Secondary | ICD-10-CM | POA: Diagnosis not present

## 2016-03-29 DIAGNOSIS — Z1283 Encounter for screening for malignant neoplasm of skin: Secondary | ICD-10-CM | POA: Diagnosis not present

## 2016-03-29 DIAGNOSIS — L812 Freckles: Secondary | ICD-10-CM | POA: Diagnosis not present

## 2016-03-29 DIAGNOSIS — D1800 Hemangioma unspecified site: Secondary | ICD-10-CM | POA: Diagnosis not present

## 2016-03-29 DIAGNOSIS — L578 Other skin changes due to chronic exposure to nonionizing radiation: Secondary | ICD-10-CM | POA: Diagnosis not present

## 2016-03-29 DIAGNOSIS — L718 Other rosacea: Secondary | ICD-10-CM | POA: Diagnosis not present

## 2016-03-29 DIAGNOSIS — Z85828 Personal history of other malignant neoplasm of skin: Secondary | ICD-10-CM | POA: Diagnosis not present

## 2016-04-26 DIAGNOSIS — I4891 Unspecified atrial fibrillation: Secondary | ICD-10-CM | POA: Diagnosis not present

## 2016-04-26 DIAGNOSIS — I472 Ventricular tachycardia: Secondary | ICD-10-CM | POA: Diagnosis not present

## 2016-05-03 DIAGNOSIS — I25810 Atherosclerosis of coronary artery bypass graft(s) without angina pectoris: Secondary | ICD-10-CM | POA: Diagnosis not present

## 2016-05-03 DIAGNOSIS — I5022 Chronic systolic (congestive) heart failure: Secondary | ICD-10-CM | POA: Diagnosis not present

## 2016-05-21 ENCOUNTER — Other Ambulatory Visit: Payer: Commercial Managed Care - HMO

## 2016-05-22 ENCOUNTER — Ambulatory Visit
Admission: RE | Admit: 2016-05-22 | Discharge: 2016-05-22 | Disposition: A | Discharge status: Home / Self Care | Payer: Commercial Managed Care - HMO | Source: Ambulatory Visit | Attending: Cardiology | Admitting: Cardiology

## 2016-05-22 ENCOUNTER — Encounter
Admission: RE | Admit: 2016-05-22 | Discharge: 2016-05-22 | Disposition: A | Discharge status: Home / Self Care | Payer: Commercial Managed Care - HMO | Source: Ambulatory Visit | Attending: Cardiology | Admitting: Cardiology

## 2016-05-22 DIAGNOSIS — I429 Cardiomyopathy, unspecified: Secondary | ICD-10-CM | POA: Insufficient documentation

## 2016-05-22 DIAGNOSIS — Z951 Presence of aortocoronary bypass graft: Secondary | ICD-10-CM | POA: Insufficient documentation

## 2016-05-22 DIAGNOSIS — R001 Bradycardia, unspecified: Secondary | ICD-10-CM | POA: Diagnosis not present

## 2016-05-22 DIAGNOSIS — Z01818 Encounter for other preprocedural examination: Secondary | ICD-10-CM | POA: Diagnosis not present

## 2016-05-22 DIAGNOSIS — R918 Other nonspecific abnormal finding of lung field: Secondary | ICD-10-CM | POA: Diagnosis not present

## 2016-05-22 HISTORY — DX: Family history of other specified conditions: Z84.89

## 2016-05-22 HISTORY — DX: Presence of automatic (implantable) cardiac defibrillator: Z95.810

## 2016-05-22 HISTORY — DX: Malignant (primary) neoplasm, unspecified: C80.1

## 2016-05-22 HISTORY — DX: Polyneuropathy, unspecified: G62.9

## 2016-05-22 LAB — CBC
HCT: 41.3 % (ref 40.0–52.0)
Hemoglobin: 13.7 g/dL (ref 13.0–18.0)
MCH: 28.9 pg (ref 26.0–34.0)
MCHC: 33.1 g/dL (ref 32.0–36.0)
MCV: 87.3 fL (ref 80.0–100.0)
Platelets: 136 10*3/uL — ABNORMAL LOW (ref 150–440)
RBC: 4.73 MIL/uL (ref 4.40–5.90)
RDW: 16.6 % — ABNORMAL HIGH (ref 11.5–14.5)
WBC: 5.1 10*3/uL (ref 3.8–10.6)

## 2016-05-22 LAB — APTT: aPTT: 36 seconds (ref 24–36)

## 2016-05-22 LAB — PROTIME-INR
INR: 2.21
Prothrombin Time: 24.9 seconds — ABNORMAL HIGH (ref 11.4–15.2)

## 2016-05-22 LAB — BASIC METABOLIC PANEL
Anion gap: 6 (ref 5–15)
BUN: 12 mg/dL (ref 6–20)
CO2: 30 mmol/L (ref 22–32)
Calcium: 9.2 mg/dL (ref 8.9–10.3)
Chloride: 104 mmol/L (ref 101–111)
Creatinine, Ser: 0.83 mg/dL (ref 0.61–1.24)
GFR calc Af Amer: >60 mL/min (ref >60)
GFR calc non Af Amer: >60 mL/min (ref >60)
Glucose, Bld: 188 mg/dL — ABNORMAL HIGH (ref 65–99)
Potassium: 4.5 mmol/L (ref 3.5–5.1)
Sodium: 140 mmol/L (ref 135–145)

## 2016-05-22 LAB — SURGICAL PCR SCREEN
MRSA, PCR: NEGATIVE
Staphylococcus aureus: NEGATIVE

## 2016-05-22 LAB — DIFFERENTIAL
Basophils Absolute: 0 10*3/uL (ref 0–0.1)
Basophils Relative: 1 %
Eosinophils Absolute: 0.1 10*3/uL (ref 0–0.7)
Eosinophils Relative: 1 %
Lymphocytes Relative: 33 %
Lymphs Abs: 1.7 10*3/uL (ref 1.0–3.6)
Monocytes Absolute: 0.5 10*3/uL (ref 0.2–1.0)
Monocytes Relative: 9 %
Neutro Abs: 2.9 10*3/uL (ref 1.4–6.5)
Neutrophils Relative %: 56 %

## 2016-05-22 NOTE — Patient Instructions (Signed)
  Your procedure is scheduled on:Wednesday Nov. 15 , 2017. Report to Same Day Surgery. To find out your arrival time please call [phone] between 1PM - 3PM on Tuesday Nov. 14, 2017.  Remember: Instructions that are not followed completely may result in serious medical risk, up to and including death, or upon the discretion of your surgeon and anesthesiologist your surgery may need to be rescheduled.    _x___ 1. Do not eat food or drink liquids after midnight. No gum chewing or hard candies.     _x___ 2. No Alcohol for 24 hours before or after surgery.   ____ 3. Bring all medications with you on the day of surgery if instructed.    __x__ 4. Notify your doctor if there is any change in your medical condition     (cold, fever, infections).    _____ 5. No smoking 24 hours prior to surgery.     Do not wear jewelry, make-up, hairpins, clips or nail polish.  Do not wear lotions, powders, or perfumes.   Do not shave 48 hours prior to surgery. Men may shave face and neck.  Do not bring valuables to the hospital.    [facility] is not responsible for any belongings or valuables.               Contacts, dentures or bridgework may not be worn into surgery.  Leave your suitcase in the car. After surgery it may be brought to your room.  For patients admitted to the hospital, discharge time is determined by your treatment team.   Patients discharged the day of surgery will not be allowed to drive home.    Please read over the following fact sheets that you were given:   [facility] Preparing for Surgery  _x___ Take these medicines the morning of surgery with A SIP OF WATER:    1. amiodarone (PACERONE)   2. atorvastatin (LIPITOR)  3. levothyroxine (SYNTHROID, LEVOTHROID)  4. metoprolol succinate (TOPROL-XL)  5. omeprazole (PRILOSEC)  6. ALPRAZolam Duanne Moron)  ____ Fleet Enema (as directed)   _x___ Use CHG Soap as directed on instruction sheet  ____ Use inhalers on the day of surgery  and bring to hospital day of surgery  ____ Stop metformin 2 days prior to surgery    _x___ Take 1/2 of usual insulin dose the night before surgery and none on the morning of  surgery.   ____ Stop Coumadin per Dr. Alveria Apley instructions.  _x__ Stop Anti-inflammatories such as Advil, Aleve, Ibuprofen, Motrin, Naproxen,  Naprosyn, Goodies powders or aspirin products. OK to take Tylenol.   _x___ Stop supplements: B vitamins until after surgery.    ____ Bring C-Pap to the hospital.

## 2016-05-29 MED ORDER — SODIUM CHLORIDE 0.9 % IR SOLN
Freq: Once | Status: AC
  Administered 2016-05-30: 450 mL
  Filled 2016-05-29: qty 2

## 2016-05-29 MED ORDER — CEFAZOLIN IN D5W 1 GM/50ML IV SOLN
1.0000 g | Freq: Once | INTRAVENOUS | Status: AC
  Administered 2016-05-30 (×2): 1 g via INTRAVENOUS

## 2016-05-30 ENCOUNTER — Encounter
Admission: RE | Disposition: A | Discharge status: Home / Self Care | Payer: Self-pay | Source: Ambulatory Visit | Attending: Cardiology

## 2016-05-30 ENCOUNTER — Ambulatory Visit: Payer: Commercial Managed Care - HMO | Admitting: Anesthesiology

## 2016-05-30 ENCOUNTER — Ambulatory Visit
Admission: RE | Admit: 2016-05-30 | Discharge: 2016-05-30 | Disposition: A | Discharge status: Home / Self Care | Payer: Commercial Managed Care - HMO | Source: Ambulatory Visit | Attending: Cardiology | Admitting: Cardiology

## 2016-05-30 ENCOUNTER — Encounter: Payer: Self-pay | Admitting: *Deleted

## 2016-05-30 DIAGNOSIS — I252 Old myocardial infarction: Secondary | ICD-10-CM | POA: Diagnosis not present

## 2016-05-30 DIAGNOSIS — E039 Hypothyroidism, unspecified: Secondary | ICD-10-CM | POA: Diagnosis not present

## 2016-05-30 DIAGNOSIS — Z4502 Encounter for adjustment and management of automatic implantable cardiac defibrillator: Secondary | ICD-10-CM | POA: Diagnosis not present

## 2016-05-30 DIAGNOSIS — E291 Testicular hypofunction: Secondary | ICD-10-CM | POA: Insufficient documentation

## 2016-05-30 DIAGNOSIS — Z794 Long term (current) use of insulin: Secondary | ICD-10-CM | POA: Diagnosis not present

## 2016-05-30 DIAGNOSIS — Z87891 Personal history of nicotine dependence: Secondary | ICD-10-CM | POA: Insufficient documentation

## 2016-05-30 DIAGNOSIS — G500 Trigeminal neuralgia: Secondary | ICD-10-CM | POA: Diagnosis not present

## 2016-05-30 DIAGNOSIS — Z8249 Family history of ischemic heart disease and other diseases of the circulatory system: Secondary | ICD-10-CM | POA: Diagnosis not present

## 2016-05-30 DIAGNOSIS — I110 Hypertensive heart disease with heart failure: Secondary | ICD-10-CM | POA: Diagnosis not present

## 2016-05-30 DIAGNOSIS — I2510 Atherosclerotic heart disease of native coronary artery without angina pectoris: Secondary | ICD-10-CM | POA: Insufficient documentation

## 2016-05-30 DIAGNOSIS — Z8719 Personal history of other diseases of the digestive system: Secondary | ICD-10-CM | POA: Insufficient documentation

## 2016-05-30 DIAGNOSIS — Z803 Family history of malignant neoplasm of breast: Secondary | ICD-10-CM | POA: Insufficient documentation

## 2016-05-30 DIAGNOSIS — I255 Ischemic cardiomyopathy: Secondary | ICD-10-CM | POA: Insufficient documentation

## 2016-05-30 DIAGNOSIS — E119 Type 2 diabetes mellitus without complications: Secondary | ICD-10-CM | POA: Insufficient documentation

## 2016-05-30 DIAGNOSIS — I429 Cardiomyopathy, unspecified: Secondary | ICD-10-CM | POA: Diagnosis not present

## 2016-05-30 DIAGNOSIS — Z833 Family history of diabetes mellitus: Secondary | ICD-10-CM | POA: Diagnosis not present

## 2016-05-30 DIAGNOSIS — I4891 Unspecified atrial fibrillation: Secondary | ICD-10-CM | POA: Diagnosis not present

## 2016-05-30 DIAGNOSIS — N400 Enlarged prostate without lower urinary tract symptoms: Secondary | ICD-10-CM | POA: Diagnosis not present

## 2016-05-30 DIAGNOSIS — Z7901 Long term (current) use of anticoagulants: Secondary | ICD-10-CM | POA: Diagnosis not present

## 2016-05-30 DIAGNOSIS — Z923 Personal history of irradiation: Secondary | ICD-10-CM | POA: Diagnosis not present

## 2016-05-30 DIAGNOSIS — I5022 Chronic systolic (congestive) heart failure: Secondary | ICD-10-CM | POA: Insufficient documentation

## 2016-05-30 DIAGNOSIS — Z951 Presence of aortocoronary bypass graft: Secondary | ICD-10-CM | POA: Diagnosis not present

## 2016-05-30 HISTORY — PX: IMPLANTABLE CARDIOVERTER DEFIBRILLATOR (ICD) GENERATOR CHANGE: SHX5469

## 2016-05-30 LAB — PROTIME-INR
INR: 1.05
Prothrombin Time: 13.7 seconds (ref 11.4–15.2)

## 2016-05-30 LAB — GLUCOSE, CAPILLARY: Glucose-Capillary: 145 mg/dL — ABNORMAL HIGH (ref 65–99)

## 2016-05-30 SURGERY — ICD GENERATOR CHANGE
Anesthesia: Monitor Anesthesia Care | Site: Shoulder | Laterality: Left | Wound class: Clean

## 2016-05-30 MED ORDER — BUPIVACAINE HCL (PF) 0.5 % IJ SOLN
INTRAMUSCULAR | Status: DC | PRN
  Administered 2016-05-30: 25 mL

## 2016-05-30 MED ORDER — FENTANYL CITRATE (PF) 100 MCG/2ML IJ SOLN
INTRAMUSCULAR | Status: DC | PRN
  Administered 2016-05-30 (×2): 25 ug via INTRAVENOUS
  Administered 2016-05-30: 50 ug via INTRAVENOUS

## 2016-05-30 MED ORDER — ONDANSETRON HCL 4 MG/2ML IJ SOLN: 4.0000 mg | Freq: Once | INTRAMUSCULAR | Status: DC | PRN

## 2016-05-30 MED ORDER — MIDAZOLAM HCL 2 MG/2ML IJ SOLN
INTRAMUSCULAR | Status: DC | PRN
  Administered 2016-05-30: 2 mg via INTRAVENOUS

## 2016-05-30 MED ORDER — CEPHALEXIN 250 MG PO CAPS: 250.0000 mg | ORAL_CAPSULE | Freq: Four times a day (QID) | ORAL | 0 refills | Status: DC

## 2016-05-30 MED ORDER — CEFAZOLIN IN D5W 1 GM/50ML IV SOLN
INTRAVENOUS | Status: AC
  Administered 2016-05-30: 1 g via INTRAVENOUS
  Filled 2016-05-30: qty 50

## 2016-05-30 MED ORDER — PROPOFOL 500 MG/50ML IV EMUL
INTRAVENOUS | Status: DC | PRN
  Administered 2016-05-30: 25 ug/kg/min via INTRAVENOUS

## 2016-05-30 MED ORDER — SODIUM CHLORIDE 0.9 % IV SOLN
INTRAVENOUS | Status: DC
  Administered 2016-05-30 (×2): via INTRAVENOUS

## 2016-05-30 MED ORDER — BUPIVACAINE HCL (PF) 0.5 % IJ SOLN
INTRAMUSCULAR | Status: AC
  Filled 2016-05-30: qty 30

## 2016-05-30 MED ORDER — FENTANYL CITRATE (PF) 100 MCG/2ML IJ SOLN: 25.0000 ug | INTRAMUSCULAR | Status: DC | PRN

## 2016-05-30 SURGICAL SUPPLY — 48 items
BAG DECANTER FOR FLEXI CONT (MISCELLANEOUS) ×2 IMPLANT
BLADE PHOTON ILLUMINATED (MISCELLANEOUS) ×2 IMPLANT
BLADE SURG SZ10 CARB STEEL (BLADE) ×2 IMPLANT
CANISTER SUCT 1200ML W/VALVE (MISCELLANEOUS) ×2 IMPLANT
CHLORAPREP W/TINT 26ML (MISCELLANEOUS) ×2 IMPLANT
CNTNR SPEC 2.5X3XGRAD LEK (MISCELLANEOUS) ×1
CONT SPEC 4OZ STER OR WHT (MISCELLANEOUS) ×1
CONTAINER SPEC 2.5X3XGRAD LEK (MISCELLANEOUS) ×1 IMPLANT
COVER LIGHT HANDLE STERIS (MISCELLANEOUS) ×4 IMPLANT
DEVICE DISSECT PLASMABLAD 3.0S (MISCELLANEOUS) IMPLANT
DRAPE C-ARM XRAY 36X54 (DRAPES) IMPLANT
DRAPE INCISE IOBAN 66X45 STRL (DRAPES) ×2 IMPLANT
DRAPE LAPAROTOMY 77X122 PED (DRAPES) ×2 IMPLANT
DRSG TEGADERM 4X4.75 (GAUZE/BANDAGES/DRESSINGS) ×2 IMPLANT
DRSG TEGADERM 6X8 (GAUZE/BANDAGES/DRESSINGS) ×2 IMPLANT
ELECT REM PT RETURN 9FT ADLT (ELECTROSURGICAL) ×2
ELECTRODE REM PT RTRN 9FT ADLT (ELECTROSURGICAL) ×1 IMPLANT
GLOVE BIO SURGEON STRL SZ8 (GLOVE) ×2 IMPLANT
GOWN STRL REUS W/ TWL LRG LVL3 (GOWN DISPOSABLE) ×1 IMPLANT
GOWN STRL REUS W/ TWL XL LVL3 (GOWN DISPOSABLE) ×1 IMPLANT
GOWN STRL REUS W/TWL LRG LVL3 (GOWN DISPOSABLE) ×1
GOWN STRL REUS W/TWL XL LVL3 (GOWN DISPOSABLE) ×1
GRADUATE 1200CC STRL 31836 (MISCELLANEOUS) IMPLANT
ICD EVERA XT MRI DF1  DDMB1D1 (ICD Generator) ×1 IMPLANT
ICD EVERA XT MRI DF1 DDMB1D1 (ICD Generator) ×1 IMPLANT
IV NS 1000ML (IV SOLUTION) ×1
IV NS 1000ML BAXH (IV SOLUTION) ×1 IMPLANT
KIT RM TURNOVER STRD PROC AR (KITS) ×2 IMPLANT
NEEDLE FILTER BLUNT 18X 1/2SAF (NEEDLE) ×1
NEEDLE FILTER BLUNT 18X1 1/2 (NEEDLE) ×1 IMPLANT
NEEDLE HYPO 25X1 1.5 SAFETY (NEEDLE) ×2 IMPLANT
NEEDLE SPNL 22GX3.5 QUINCKE BK (NEEDLE) ×2 IMPLANT
NS IRRIG 500ML POUR BTL (IV SOLUTION) ×2 IMPLANT
PACK BASIN MINOR ARMC (MISCELLANEOUS) ×2 IMPLANT
PACK PACE INSERTION (MISCELLANEOUS) ×2 IMPLANT
PAD STATPAD (MISCELLANEOUS) ×2 IMPLANT
PLASMABLADE 3.0S (MISCELLANEOUS)
STRAP SAFETY BODY (MISCELLANEOUS) ×2 IMPLANT
STRIP CLOSURE SKIN 1/2X4 (GAUZE/BANDAGES/DRESSINGS) ×2 IMPLANT
SUT SILK 2 0 SH (SUTURE) ×2 IMPLANT
SUT VIC AB 2-0 CT1 27 (SUTURE) ×1
SUT VIC AB 2-0 CT1 TAPERPNT 27 (SUTURE) ×1 IMPLANT
SUT VIC AB 2-0 CT2 27 (SUTURE) ×2 IMPLANT
SUT VIC AB 3-0 PS2 18 (SUTURE) ×2 IMPLANT
SUT VIC AB 4-0 PS2 18 (SUTURE) ×2 IMPLANT
SYR BULB IRRIG 60ML STRL (SYRINGE) ×2 IMPLANT
SYR CONTROL 10ML (SYRINGE) ×2 IMPLANT
SYRINGE 10CC LL (SYRINGE) ×2 IMPLANT

## 2016-05-30 NOTE — Anesthesia Procedure Notes (Signed)
Performed by: [id number], Abbigal Radich Oxygen Delivery Method: Simple face mask

## 2016-05-30 NOTE — Transfer of Care (Signed)
Immediate Anesthesia Transfer of Care Note  Patient: Dakota Thomas  Procedure(s) Performed: Procedure(s): ICD GENERATOR CHANGE (Left)  Patient Location: PACU  Anesthesia Type:MAC  Level of Consciousness: awake, alert  and oriented  Airway & Oxygen Therapy: Patient Spontanous Breathing  Post-op Assessment: Report given to RN  Post vital signs: Reviewed and stable  Last Vitals:  Vitals:   05/30/16 0959 05/30/16 1328  BP: (!) 148/75 115/72  Pulse: 64 63  Resp: 16 10  Temp: 36.7 C 37 C    Last Pain:  Vitals:   05/30/16 0959  TempSrc: Oral         Complications: No apparent anesthesia complications

## 2016-05-30 NOTE — H&P (Signed)
<6>29299-5<7>Encounter Details<6>46240-8<7>Social History<6>29762-2<7>Last Filed Vital Signs<6>8716-3<7>Progress Notes<6>10164-2<7>Plan of Treatment<6>18776-5<7>Visit Diagnoses<6>51848-0<7>/"> Jump to Section ? Document InformationEncounter DetailsLast Filed Vital SignsPatient DemographicsPlan of TreatmentProgress NotesReason for VisitSocial HistoryVisit Diagnoses [name] Encounter Summary, generated on Nov. 14, 2017 Printout Information  Document Contents Office Visit Document Received Date Nov. 14, 2017 [place]   Patient Demographics - 82 y.o. Male, born Apr 25, 1934   Patient Address Communication Language Race / Ethnicity  [address] [place], [place] [address] [phone] (Home) [phone] (Mobile) [email or web address] English (Preferred) White / Not Hispanic or Latino  Reason for Visit    Reason Comments  Follow-up ICD changeout  Cardiomyopathy   Congestive Heart Failure     Consultation (Routine) Consultation (Routine)  Status Reason Specialty Diagnoses / Procedures Referred By Contact Referred To Contact  Authorized  Cardiovascular Disease / Cardiology Diagnoses  Atherosclerotic heart disease of native coronary artery without angina pectoris  THN 4 MO *MESS TAYLOR 12/02/15*     Procedures  RETURN VISIT  Flossie Dibble, MD  [address]  [facility]  [place], [place] [address]  Phone: [phone]  Fax: [phone]  Flossie Dibble, MD  [address]  [facility]  [place], [place] [address]  Phone: [phone]  Fax: [phone]      Encounter Details    Date Type Department Care Team Description  05/03/2016 Office Visit [facility]  [place], [place] [address]  [phone]  Flossie Dibble, [place]  [facility]  [place], [place] [address]   [phone]  [phone] (Fax)  Chronic systolic CHF (congestive heart failure), NYHA class 2 (CMS-HCC) (Primary Dx);  Coronary artery disease involving autologous vein coronary bypass graft without angina pectoris   Social History - as of this encounter   Tobacco Use Types Packs/Day Years Used Date  Former Smoker    Quit: 01/15/1984  Smokeless Tobacco: Never Used      Alcohol Use Drinks/Week oz/Week Comments  Yes 0 Standard drinks or equivalent  0.0  monthly   Sex Assigned at Birth Date Recorded  Not on file    Last Filed Vital Signs - in this encounter   Vital Sign Reading Time Taken  Blood Pressure 138/70 05/03/2016 9:14 AM EDT  Pulse 64 05/03/2016 9:14 AM EDT  Temperature - -  Respiratory Rate - -  Oxygen Saturation - -  Inhaled Oxygen Concentration - -  Weight 90.7 kg (200 lb) 05/03/2016 9:14 AM EDT  Height 185.4 cm (6\' 1" ) 05/03/2016 9:14 AM EDT  Body Mass Index 26.39 05/03/2016 9:14 AM EDT   Progress Notes - in this encounter   Flossie Dibble, MD - 05/03/2016 9:45 AM EDT Formatting of this note may be different from the original. Established Patient Visit   Chief Complaint: Chief Complaint  Patient presents with  . Follow-up  ICD changeout  . Cardiomyopathy  . Congestive Heart Failure  Date of Service: 05/03/2016 Date of Birth: Jan 04, 1934 PCP: Azzie Glatter, MD  History of Present Illness: [name] is a 82 y.o.male patient  Congestive heart failure The patient has chronic severe systolic heart failure secondary to coronary artery disease (CAD). Congestive Heart Failure has been manifested by none with a [place] Functional Class of II. The Patient appears euvolemic. and is on beta-blocker with diuretics stable over the last 3 years  Coronary Artery Disease The patient has coronary artery disease previously diagnosed by imaging study years ago currently  on appropriate medication management including b-blocker and statin  for risk factor reduction. Current risk factors for progression include age, male, HTN and Hyperlipidemia. The patient appears not to have any significant side effects of treatment at this time and appears stable at this time without evidence of clinical progression of the disease process  Defibrillator Interrogation The patient has had the placement of a defibrillator for significant LV systolic dysfunction. Recent interrogation has revealed that the battery voltage is low indicating the need for change out for a new battery. The patient understands the risks and benefits of the defibrillator battery change.  Past Medical and Surgical History  Past Medical History Past Medical History:  Diagnosis Date  . Allergic state  Allergic Rhinitis  . Atrial fibrillation , unspecified (CMS-HCC)  . Bacteremia 2017  . BPH (benign prostatic hypertrophy)  . CAD (coronary artery disease)  . Diabetes mellitus type 2, uncomplicated (CMS-HCC)  . Diverticulitis  . Hyperlipidemia, unspecified  . Hypertension  . Hypothyroidism, unspecified  Post radiation  . Ischemic cardiomyopathy  v.Tach  . Kidney stones  . Post herpetic neuralgia  left side of face  . Rosacea  . Testosterone deficiency  . Tremors of nervous system  . Trigeminal neuralgia  Post herpetic   Past Surgical History He has a past surgical history that includes Coronary artery bypass graft; Coronary artery bypass graft; aicd; Tonsillectomy; Deviated nasal septum; cervical diskectomy; Cholecystectomy; Appendectomy; Colonoscopy (10/30/2012); Kidney stone removal ; ostate surgery; egd (10/30/2012); and Colonoscopy (01/27/2016).   Medications and Allergies  Current Medications  Current Outpatient Prescriptions  Medication Sig Dispense Refill  . ALPHA LIPOIC ACID ORAL Take 600 mg by mouth once daily.  Marland Kitchen ALPRAZolam (XANAX) 0.5 MG tablet Take 1 tablet (0.5 mg total) by mouth 2 (two) times daily as needed. 180 tablet 0  . AMIOdarone  (PACERONE) 200 MG tablet Take 0.5 tablets (100 mg total) by mouth once daily. 45 tablet 3  . atorvastatin (LIPITOR) 40 MG tablet Take 1 tablet (40 mg total) by mouth once daily. 90 tablet 2  . blood glucose diagnostic test strip Use 3 (three) times daily. Dx code E11.9 True Test Quad Electronic 200 each 5  . FUROsemide (LASIX) 20 MG tablet Take 1 tablet (20 mg total) by mouth once daily. Pt will start taking every other day (Patient taking differently: Take 20 mg by mouth every other day. Pt will start taking every other day ) 30 tablet 5  . gabapentin (NEURONTIN) 600 MG tablet Take 1 tablet (600 mg total) by mouth 3 (three) times daily. (Patient taking differently: Take 300 mg by mouth 2 (two) times daily.  ) 270 tablet 3  . HYDROcodone-acetaminophen (NORCO) 5-325 mg tablet Take 1 tablet by mouth once daily as needed for Pain. Earliest Fill Date: 04/17/16 30 tablet 0  . insulin ASPART (NOVOLOG FLEXPEN) 100 unit/mL pen injector 2-10 units tid with meals as per sliding scale 15 mL 12  . insulin GLARGINE (LANTUS SOLOSTAR, BASAGLAR KWIKPEN) pen injector (concentration 100 units/mL) Inject 20 Units subcutaneously nightly. 15 mL 5  . levothyroxine (SYNTHROID, LEVOTHROID) 100 MCG tablet Take 1 tablet (100 mcg total) by mouth every morning. Take on an empty stomach with a glass of water at least 30 minutes before breakfast. 90 tablet 3  . loperamide (IMODIUM A-D) 2 mg tablet Take 2 mg by mouth as needed for Diarrhea.  . metoprolol succinate (TOPROL-XL) 25 MG XL tablet Take 1 tablet (25 mg total) by mouth once daily. 180 tablet 3  .  multivitamin tablet Take 1 tablet by mouth once daily.  Marland Kitchen NIACINAMIDE ORAL Take by mouth.  Marland Kitchen omeprazole (PRILOSEC) 20 MG DR capsule Take 1 capsule (20 mg total) by mouth once daily. 90 capsule 3  . pen needle, diabetic 31 gauge x 5/16" needle Use as directed. Four times daily with Humalog and Lantus insulin 400 each 5  . potassium gluconate 575 mg (95 mg) TbER Take by mouth every  other day.  . pyridoxine (VITAMIN B-6) 50 MG tablet Take 100 mg by mouth once daily.  . tamsulosin (FLOMAX) 0.4 mg capsule Take 0.4 mg by mouth once daily. Take 30 minutes after same meal each day.  . testosterone cypionate (DEPO-TESTOSTERONE) 200 mg/mL injection Inject 1 mL (200 mg total) into the muscle every 28 (twenty-eight) days. 10 mL 0  . UBIDECARENONE (COQ-10 ORAL) Take 1 capsule by mouth once daily.  Marland Kitchen warfarin (COUMADIN) 2.5 MG tablet Take 1 tablet (2.5 mg total) by mouth as directed. 90 tablet 3  . warfarin (COUMADIN) 5 MG tablet Take 1 tablet (5 mg total) by mouth as directed. 90 tablet 3   No current facility-administered medications for this visit.   Allergies: Ciprofloxacin; Levaquin [levofloxacin]; Lidocaine; Metformin; and Tannic acid  Social and Family History  Social History reports that he quit smoking about 32 years ago. He has never used smokeless tobacco. He reports that he drinks alcohol.  Family History Family History  Problem Relation Age of Onset  . Heart disease Mother  . Diabetes type II Mother  . Coronary artery disease Mother  . Heart attack Mother  . Hypertension Mother  . Heart disease Father  . Diabetes type II Father  . Emphysema Father  . Heart attack Father  . Hypertension Father  . Breast cancer Other  siblings   Review of Systems   Review of Systems  Positive for none Negative for weight gain weight loss, weakness, vision change, hearing loss, cough, congestion, PND, orthopnea, heartburn, nausea, diaphoresis, vomiting, diarrhea, bloody stool, melena, stomach pain, extremity pain, leg weakness, leg cramping, leg blood clots, headache, blackouts, nosebleed, trouble swallowing, mouth pain, urinary frequency, urination at night, muscle weakness, skin lesions, skin rashes, tingling ,ulcers, numbness, anxiety, and/or depression Physical Examination   Vitals:BP 138/70  Pulse 64  Ht 185.4 cm (6\' 1" )  Wt 90.7 kg (200 lb)  BMI 26.39  kg/m2 Ht:185.4 cm (6\' 1" ) Wt:90.7 kg (200 lb) [id number] surface area is 2.16 meters squared. Body mass index is 26.39 kg/(m^2). Appearance: well appearing in no acute distress HEENT: Pupils equally reactive to light and accomodation, no xanthalasma  Neck: Supple, no apparent thyromegaly, masses, or lymphadenopathy  Lungs: normal respiratory effort; no crackles, no rhonchi, no wheezes Heart: Regular rate and rhythm. Normal S1 S2 No gallops, murmur, no rub, PMI is normal size and placement. carotid upstroke normal without bruit. Jugular venous pressure is normal Abdomen: soft, nontender, not distended with normal bowel sounds. No apparent hepatosplenomegally. Abdominal aorta is normal size without bruit Extremities: no edema, no ulcers, no clubbing, no cyanosis Peripheral Pulses: 2+ in upper extremities, 2+ femoral pulses bilaterally, 2+lower extremity  Musculoskeletal; Normal muscle tone without kyphosis Neurological: Oriented and Alert, Cranial nerves intact  Assessment   82 y.o. male with  Encounter Diagnoses  Name Primary?  . Chronic systolic CHF (congestive heart failure), NYHA class 2 (CMS-HCC) Yes  . Coronary artery disease involving autologous vein coronary bypass graft without angina pectoris   Plan  -The patient is to have consultation and subsequent change  of battery of the defibrillator. The patient understands all the risks and benefits of this procedure. This includes the possibility of death, stroke, heart attack, hemopericardium, pneumothorax, infection, bleeding, blood clot, and or reaction to medications. The patient is at low risk for general anesthesia. -There has been a long discussion of the risks and benefits of current medical and non-medical treatment of congestive heart failure. The patient will continue to use the maximum measures possible to reduce future exacerbation and hospitalization.  -The patient will continue following closely for symptoms of cardiovascular  disease including shortness of breath, chest discomfort, giving out, having palpitations, and/or significant progression in weakness and/or fatigue. They understand that these symptoms could be a clue to coronary artery disease progression and to report these symptoms as soon as possible. The patient understands to continue current medical regimen for further risk reduction and progression of symptoms and/or cardiovascular disease.  -Proceed to surgery and/or invasive procedure without restriction. The patient is at the lowest risk possible for cardiovascular complication as stated above. Patient may discontinue anticoagulation 3 days prior to surgical intervention and or procedure for reduced bleeding risk. The patient shall restart at a safe period after procedure when bleeding risk is reduced. Patient understands all the risks and benefits of anticoagulation and discontinuation of anticoagulation perioperatively and the quoted study data that applies   No orders of the defined types were placed in this encounter.  Return in about 2 months (around 07/03/2016).  Flossie Dibble, MD      Plan of Treatment - as of this encounter   Upcoming Encounters Upcoming Encounters  Date Type Specialty Care Team Description  07/05/2016 Office Visit Cardiology Flossie Dibble, MD  [address]  [facility]  [place], [place] [address]  [phone]  [phone] (Fax)     Visit Diagnoses    Diagnosis  Chronic systolic CHF (congestive heart failure), NYHA class 2 (CMS-HCC) - Primary  Coronary artery disease involving autologous vein coronary bypass graft without angina pectoris   Images Document Information   Service Providers Document Coverage Dates Oct. 19, 2017  [place] [phone] (Work) [place], [place] [address]   Encounter Providers Bruce Lenn Sink MD (Attending) tel:[phone]  (Work) fax:[phone] [address] [facility] [place], [place] [address]   Encounter Date Oct. 19, 2017   Show All Sections

## 2016-05-30 NOTE — Discharge Instructions (Signed)
May take off powder dressing on 05/30/2016. Leave Steri-Strips on. May shower 05/30/2016.  AMBULATORY SURGERY  DISCHARGE INSTRUCTIONS   1) The drugs that you were given will stay in your system until tomorrow so for the next 24 hours you should not:  A) Drive an automobile B) Make any legal decisions C) Drink any alcoholic beverage   2) You may resume regular meals tomorrow.  Today it is better to start with liquids and gradually work up to solid foods.  You may eat anything you prefer, but it is better to start with liquids, then soup and crackers, and gradually work up to solid foods.   3) Please notify your doctor immediately if you have any unusual bleeding, trouble breathing, redness and pain at the surgery site, drainage, fever, or pain not relieved by medication.    4) Additional Instructions:        Please contact your physician with any problems or Same Day Surgery at [phone], Monday through Friday 6 am to 4 pm, or  at [facility] number at [phone].

## 2016-05-30 NOTE — Anesthesia Preprocedure Evaluation (Signed)
Anesthesia Evaluation  Patient identified by MRN, date of birth, ID band Patient awake    Reviewed: Allergy & Precautions, NPO status , Patient's Chart, lab work & pertinent test results  Airway Mallampati: II       Dental  (+) Teeth Intact   Pulmonary neg pulmonary ROS, former smoker,    breath sounds clear to auscultation       Cardiovascular Exercise Tolerance: Good hypertension, Pt. on medications and Pt. on home beta blockers + CAD and + Past MI  + Cardiac Defibrillator  Rhythm:Regular Rate:Normal     Neuro/Psych Anxiety    GI/Hepatic negative GI ROS, Neg liver ROS,   Endo/Other  diabetes, Type 1, Insulin DependentHypothyroidism   Renal/GU      Musculoskeletal   Abdominal Normal abdominal exam  (+)   Peds  Hematology   Anesthesia Other Findings   Reproductive/Obstetrics                             Anesthesia Physical Anesthesia Plan  ASA: III  Anesthesia Plan: MAC   Post-op Pain Management:    Induction: Intravenous  Airway Management Planned: Natural Airway and Nasal Cannula  Additional Equipment:   Intra-op Plan:   Post-operative Plan:   Informed Consent: I have reviewed the patients History and Physical, chart, labs and discussed the procedure including the risks, benefits and alternatives for the proposed anesthesia with the patient or authorized representative who has indicated his/her understanding and acceptance.     Plan Discussed with: CRNA  Anesthesia Plan Comments:         Anesthesia Quick Evaluation

## 2016-05-30 NOTE — Op Note (Signed)
[facility] Cardiology   05/30/2016                     1:32 PM  PATIENT:  Dakota Thomas    PRE-OPERATIVE DIAGNOSIS:  CARDIOMYOPATHY  POST-OPERATIVE DIAGNOSIS:  Same  PROCEDURE:  ICD GENERATOR CHANGE  SURGEON:  Isaias Cowman, MD    ANESTHESIA:     PREOPERATIVE INDICATIONS:  COTT SERENE is a  82 y.o. male with a diagnosis of CARDIOMYOPATHY who failed conservative measures and elected for surgical management.    The risks benefits and alternatives were discussed with the patient preoperatively including but not limited to the risks of infection, bleeding, cardiopulmonary complications, the need for revision surgery, among others, and the patient was willing to proceed.   OPERATIVE PROCEDURE: The patient was brought to the operating room the fasting state. The left pectoral region was prepped and draped in the usual sterile manner. 6 cm incision was performed a left pectoral region. The old ICD generator was retrieved by electrocautery and blunt dissection. The leads were disconnected and connected to a new dual-chamber MRI compatible ICD generator (Medtronic [id number]). Pacemaker pocket was irrigated with gentamicin solution. The ICD generator was positioned into the pocket and the pocket was closed with 2-0 and 4-0 Vicryl, respectively. Steri-Strips and a pressure dressing were applied.

## 2016-05-30 NOTE — Interval H&P Note (Signed)
History and Physical Interval Note:  05/30/2016 12:00 PM  Dakota Thomas  has presented today for surgery, with the diagnosis of CARDIOMYOPATHY  The various methods of treatment have been discussed with the patient and family. After consideration of risks, benefits and other options for treatment, the patient has consented to  Procedure(s): ICD GENERATOR CHANGE (Left) as a surgical intervention .  The patient's history has been reviewed, patient examined, no change in status, stable for surgery.  I have reviewed the patient's chart and labs.  Questions were answered to the patient's satisfaction.     Petrita Blunck [employer]

## 2016-05-31 ENCOUNTER — Encounter: Payer: Self-pay | Admitting: Cardiology

## 2016-05-31 DIAGNOSIS — L821 Other seborrheic keratosis: Secondary | ICD-10-CM | POA: Diagnosis not present

## 2016-05-31 DIAGNOSIS — L570 Actinic keratosis: Secondary | ICD-10-CM | POA: Diagnosis not present

## 2016-05-31 DIAGNOSIS — L739 Follicular disorder, unspecified: Secondary | ICD-10-CM | POA: Diagnosis not present

## 2016-05-31 DIAGNOSIS — L820 Inflamed seborrheic keratosis: Secondary | ICD-10-CM | POA: Diagnosis not present

## 2016-05-31 DIAGNOSIS — L578 Other skin changes due to chronic exposure to nonionizing radiation: Secondary | ICD-10-CM | POA: Diagnosis not present

## 2016-05-31 DIAGNOSIS — L812 Freckles: Secondary | ICD-10-CM | POA: Diagnosis not present

## 2016-05-31 DIAGNOSIS — L719 Rosacea, unspecified: Secondary | ICD-10-CM | POA: Diagnosis not present

## 2016-06-02 LAB — SURGICAL PATHOLOGY

## 2016-06-05 NOTE — Anesthesia Postprocedure Evaluation (Signed)
Anesthesia Post Note  Patient: Dakota Thomas  Procedure(s) Performed: Procedure(s) (LRB): ICD GENERATOR CHANGE (Left)  Patient location during evaluation: PACU Anesthesia Type: MAC Level of consciousness: awake Pain management: pain level controlled Respiratory status: spontaneous breathing Cardiovascular status: blood pressure returned to baseline Anesthetic complications: no    Last Vitals:  Vitals:   05/30/16 1428 05/30/16 1442  BP: (!) 150/76 (!) 141/73  Pulse: (!) 53 (!) 50  Resp: 16 18  Temp: 36.5 C     Last Pain:  Vitals:   05/31/16 1438  TempSrc:   PainSc: 0-No pain                 VAN STAVEREN,Shayann Garbutt

## 2016-06-12 DIAGNOSIS — E782 Mixed hyperlipidemia: Secondary | ICD-10-CM | POA: Diagnosis not present

## 2016-06-12 DIAGNOSIS — I5022 Chronic systolic (congestive) heart failure: Secondary | ICD-10-CM | POA: Diagnosis not present

## 2016-06-12 DIAGNOSIS — I25810 Atherosclerosis of coronary artery bypass graft(s) without angina pectoris: Secondary | ICD-10-CM | POA: Diagnosis not present

## 2016-08-07 DIAGNOSIS — E119 Type 2 diabetes mellitus without complications: Secondary | ICD-10-CM | POA: Diagnosis not present

## 2016-08-07 DIAGNOSIS — I4891 Unspecified atrial fibrillation: Secondary | ICD-10-CM | POA: Diagnosis not present

## 2016-08-07 DIAGNOSIS — I25810 Atherosclerosis of coronary artery bypass graft(s) without angina pectoris: Secondary | ICD-10-CM | POA: Diagnosis not present

## 2016-08-07 DIAGNOSIS — Z0000 Encounter for general adult medical examination without abnormal findings: Secondary | ICD-10-CM | POA: Diagnosis not present

## 2016-08-14 DIAGNOSIS — I10 Essential (primary) hypertension: Secondary | ICD-10-CM | POA: Diagnosis not present

## 2016-08-14 DIAGNOSIS — Z0000 Encounter for general adult medical examination without abnormal findings: Secondary | ICD-10-CM | POA: Diagnosis not present

## 2016-08-14 DIAGNOSIS — Z794 Long term (current) use of insulin: Secondary | ICD-10-CM | POA: Diagnosis not present

## 2016-08-14 DIAGNOSIS — I5022 Chronic systolic (congestive) heart failure: Secondary | ICD-10-CM | POA: Diagnosis not present

## 2016-08-14 DIAGNOSIS — L719 Rosacea, unspecified: Secondary | ICD-10-CM | POA: Diagnosis not present

## 2016-08-14 DIAGNOSIS — I25810 Atherosclerosis of coronary artery bypass graft(s) without angina pectoris: Secondary | ICD-10-CM | POA: Diagnosis not present

## 2016-08-14 DIAGNOSIS — E119 Type 2 diabetes mellitus without complications: Secondary | ICD-10-CM | POA: Diagnosis not present

## 2016-08-14 DIAGNOSIS — I340 Nonrheumatic mitral (valve) insufficiency: Secondary | ICD-10-CM | POA: Diagnosis not present

## 2016-08-14 DIAGNOSIS — E1140 Type 2 diabetes mellitus with diabetic neuropathy, unspecified: Secondary | ICD-10-CM | POA: Diagnosis not present

## 2016-09-04 DIAGNOSIS — I4891 Unspecified atrial fibrillation: Secondary | ICD-10-CM | POA: Diagnosis not present

## 2016-09-25 DIAGNOSIS — I480 Paroxysmal atrial fibrillation: Secondary | ICD-10-CM | POA: Diagnosis not present

## 2016-09-25 DIAGNOSIS — I4891 Unspecified atrial fibrillation: Secondary | ICD-10-CM | POA: Diagnosis not present

## 2016-10-23 DIAGNOSIS — I4891 Unspecified atrial fibrillation: Secondary | ICD-10-CM | POA: Diagnosis not present

## 2016-11-20 DIAGNOSIS — I4891 Unspecified atrial fibrillation: Secondary | ICD-10-CM | POA: Diagnosis not present

## 2016-11-21 ENCOUNTER — Emergency Department: Payer: Medicare HMO

## 2016-11-21 ENCOUNTER — Emergency Department
Admission: EM | Admit: 2016-11-21 | Discharge: 2016-11-22 | Disposition: A | Discharge status: Home / Self Care | Payer: Medicare HMO | Attending: Emergency Medicine | Admitting: Emergency Medicine

## 2016-11-21 ENCOUNTER — Encounter: Payer: Self-pay | Admitting: Radiology

## 2016-11-21 DIAGNOSIS — Z794 Long term (current) use of insulin: Secondary | ICD-10-CM | POA: Insufficient documentation

## 2016-11-21 DIAGNOSIS — E119 Type 2 diabetes mellitus without complications: Secondary | ICD-10-CM | POA: Insufficient documentation

## 2016-11-21 DIAGNOSIS — Z79899 Other long term (current) drug therapy: Secondary | ICD-10-CM | POA: Diagnosis not present

## 2016-11-21 DIAGNOSIS — R51 Headache: Secondary | ICD-10-CM | POA: Diagnosis present

## 2016-11-21 DIAGNOSIS — I6601 Occlusion and stenosis of right middle cerebral artery: Secondary | ICD-10-CM | POA: Diagnosis not present

## 2016-11-21 DIAGNOSIS — I10 Essential (primary) hypertension: Secondary | ICD-10-CM | POA: Diagnosis not present

## 2016-11-21 DIAGNOSIS — G43809 Other migraine, not intractable, without status migrainosus: Secondary | ICD-10-CM | POA: Diagnosis not present

## 2016-11-21 DIAGNOSIS — I2510 Atherosclerotic heart disease of native coronary artery without angina pectoris: Secondary | ICD-10-CM | POA: Diagnosis not present

## 2016-11-21 DIAGNOSIS — Z87891 Personal history of nicotine dependence: Secondary | ICD-10-CM | POA: Diagnosis not present

## 2016-11-21 LAB — CBC
HCT: 42.1 % (ref 40.0–52.0)
Hemoglobin: 14 g/dL (ref 13.0–18.0)
MCH: 30.4 pg (ref 26.0–34.0)
MCHC: 33.3 g/dL (ref 32.0–36.0)
MCV: 91.1 fL (ref 80.0–100.0)
Platelets: 140 10*3/uL — ABNORMAL LOW (ref 150–440)
RBC: 4.62 MIL/uL (ref 4.40–5.90)
RDW: 14.9 % — ABNORMAL HIGH (ref 11.5–14.5)
WBC: 4.6 10*3/uL (ref 3.8–10.6)

## 2016-11-21 LAB — URINALYSIS, COMPLETE (UACMP) WITH MICROSCOPIC
Bacteria, UA: NONE SEEN
Bilirubin Urine: NEGATIVE
Glucose, UA: NEGATIVE mg/dL
Hgb urine dipstick: NEGATIVE
Ketones, ur: NEGATIVE mg/dL
Leukocytes, UA: NEGATIVE
Nitrite: NEGATIVE
Protein, ur: NEGATIVE mg/dL
RBC / HPF: NONE SEEN RBC/hpf (ref 0–5)
Specific Gravity, Urine: 1.008 (ref 1.005–1.030)
Squamous Epithelial / LPF: NONE SEEN
WBC, UA: NONE SEEN WBC/hpf (ref 0–5)
pH: 5 (ref 5.0–8.0)

## 2016-11-21 LAB — COMPREHENSIVE METABOLIC PANEL
ALT: 18 U/L (ref 17–63)
AST: 29 U/L (ref 15–41)
Albumin: 4.2 g/dL (ref 3.5–5.0)
Alkaline Phosphatase: 73 U/L (ref 38–126)
Anion gap: 6 (ref 5–15)
BUN: 16 mg/dL (ref 6–20)
CO2: 28 mmol/L (ref 22–32)
Calcium: 9.1 mg/dL (ref 8.9–10.3)
Chloride: 104 mmol/L (ref 101–111)
Creatinine, Ser: 1.18 mg/dL (ref 0.61–1.24)
GFR calc Af Amer: >60 mL/min (ref >60)
GFR calc non Af Amer: 56 mL/min — ABNORMAL LOW (ref >60)
Glucose, Bld: 162 mg/dL — ABNORMAL HIGH (ref 65–99)
Potassium: 4.4 mmol/L (ref 3.5–5.1)
Sodium: 138 mmol/L (ref 135–145)
Total Bilirubin: 1.3 mg/dL — ABNORMAL HIGH (ref 0.3–1.2)
Total Protein: 7.9 g/dL (ref 6.5–8.1)

## 2016-11-21 LAB — PROTIME-INR
INR: 3.11
Prothrombin Time: 32.7 seconds — ABNORMAL HIGH (ref 11.4–15.2)

## 2016-11-21 MED ORDER — IOPAMIDOL (ISOVUE-370) INJECTION 76%
75.0000 mL | Freq: Once | INTRAVENOUS | Status: AC | PRN
  Administered 2016-11-21: 75 mL via INTRAVENOUS

## 2016-11-21 MED ORDER — ONDANSETRON HCL 4 MG/2ML IJ SOLN
4.0000 mg | Freq: Once | INTRAMUSCULAR | Status: AC
  Administered 2016-11-22: 4 mg via INTRAVENOUS
  Filled 2016-11-21: qty 2

## 2016-11-21 MED ORDER — MORPHINE SULFATE (PF) 2 MG/ML IV SOLN
INTRAVENOUS | Status: AC
  Filled 2016-11-21: qty 1

## 2016-11-21 MED ORDER — MORPHINE SULFATE (PF) 2 MG/ML IV SOLN
2.0000 mg | Freq: Once | INTRAVENOUS | Status: AC
  Administered 2016-11-22: 2 mg via INTRAVENOUS
  Filled 2016-11-21: qty 1

## 2016-11-21 MED ORDER — ONDANSETRON HCL 4 MG/2ML IJ SOLN
INTRAMUSCULAR | Status: AC
  Filled 2016-11-21: qty 2

## 2016-11-21 NOTE — ED Provider Notes (Signed)
[facility] Emergency Department Provider Note    First MD Initiated Contact with Patient 11/21/16 2306     (approximate)  I have reviewed the triage vital signs and the nursing notes.   HISTORY  Chief Complaint Headache    HPI Dakota Thomas is a 83 y.o. male with blow list of chronic medical conditions including migraine headaches presents with 2 day history of generalized headache with maximum intensity of 10 out of 10 current pain score is 7 out of 10 and described as throbbing. Patient denies any weakness numbness gait instability or visual changes. Patient states that headache is accompanied by ringing in his ears bilaterally. Of note patient states that his INR was elevated yesterday   Past Medical History:  Diagnosis Date  . A-fib ([place])   . AICD (automatic cardioverter/defibrillator) present   . Allergic state   . Anxiety   . Arrhythmia    atrial fib  . BPH (benign prostatic hyperplasia)   . CAD (coronary artery disease)   . Cancer (HCC)    skin of the nose and arm  . Diabetes mellitus without complication ([place])   . Family history of adverse reaction to anesthesia    daughter sleeps a long time  . H/O diverticulitis of colon   . Heart attack 1985, 1995,2000, 2004,    multiple MI's  . Heart disease   . Heartburn   . History of kidney stones   . History of stomach ulcers   . HLD (hyperlipidemia)   . Hypertension   . Hypothyroidism   . Ischemic cardiomyopathy   . Kidney stones   . Neuropathy ([place])   . Pacemaker   . Post herpetic neuralgia    left side of face  . Renal disorder    UTI's  . Rosacea   . Testosterone deficiency   . Tremors of nervous system   . Trigeminal neuralgia of left side of face     Patient Active Problem List   Diagnosis Date Noted  . BPH with obstruction/lower urinary tract symptoms 12/25/2015  . Bacteremia 11/24/2015  . Near syncope 11/22/2015  . Diabetes ([place]) 11/22/2015  . CAD (coronary artery  disease) 11/22/2015  . HTN (hypertension) 11/22/2015  . A-fib ([place]) 11/22/2015  . BPH (benign prostatic hyperplasia) 11/22/2015    Past Surgical History:  Procedure Laterality Date  . APPENDECTOMY    . BACK SURGERY     cervical diskectomy  . CHOLECYSTECTOMY    . COLONOSCOPY WITH PROPOFOL N/A 01/27/2016   Procedure: COLONOSCOPY WITH PROPOFOL;  Surgeon: Manya Silvas, MD;  Location: [facility] ENDOSCOPY;  Service: Endoscopy;  Laterality: N/A;  . CORONARY ANGIOPLASTY WITH STENT PLACEMENT     multiple times  . CORONARY ARTERY BYPASS GRAFT    . eyelids    . IMPLANTABLE CARDIOVERTER DEFIBRILLATOR (ICD) GENERATOR CHANGE Left 05/30/2016   Procedure: ICD GENERATOR CHANGE;  Surgeon: Isaias Cowman, MD;  Location: [facility] ORS;  Service: Cardiovascular;  Laterality: Left;  . kidney stones    . NASAL SEPTUM SURGERY    . ostate surgery    . PACEMAKER INSERTION    . prostate heating     seven years ago  . TEE WITHOUT CARDIOVERSION N/A 11/28/2015   Procedure: Transesophageal Echocardiogram ([place]);  Surgeon: Teodoro Spray, MD;  Location: [facility] ORS;  Service: Cardiovascular;  Laterality: N/A;  . THYROID SURGERY    . TONSILLECTOMY     x 2    Prior to Admission medications  Medication Sig Start Date End Date Taking? Authorizing Provider  acetaminophen (TYLENOL) 500 MG tablet Take 1,000 mg by mouth every 6 (six) hours as needed for mild pain.    [provider]  ALPRAZolam Duanne Moron) 0.5 MG tablet Take 0.5 mg by mouth 3 (three) times daily as needed for anxiety.    [provider]  amiodarone (PACERONE) 200 MG tablet Take 100 mg by mouth daily.    [provider]  ASPERCREME W/LIDOCAINE EX Apply 1 application topically daily as needed (back pain).    [provider]  atorvastatin (LIPITOR) 40 MG tablet Take 40 mg by mouth daily.    [provider]  cephALEXin (KEFLEX) 250 MG capsule Take 1 capsule (250 mg total) by mouth 4 (four) times daily. 05/30/16    Paraschos, Alexander, MD  cloNIDine (CATAPRES) 0.2 MG tablet Take 0.2 mg by mouth daily as needed (when BP readings are over 105).    [provider]  gabapentin (NEURONTIN) 600 MG tablet Take 300 mg by mouth 2 (two) times daily.  10/10/15   [provider]  HYDROcodone-acetaminophen (NORCO/VICODIN) 5-325 MG tablet Take 1 tablet by mouth daily as needed for moderate pain.  10/12/15   [provider]  insulin glargine (LANTUS) 100 unit/mL SOPN Inject 20 Units into the skin at bedtime.     [provider]  levothyroxine (SYNTHROID, LEVOTHROID) 100 MCG tablet Take 100 mcg by mouth daily before breakfast.    [provider]  loperamide (IMODIUM) 2 MG capsule Take 2 mg by mouth as needed for diarrhea or loose stools.    [provider]  metoprolol succinate (TOPROL-XL) 25 MG 24 hr tablet Take 25 mg by mouth daily.  10/10/15   [provider]  Multiple Vitamin (MULTIVITAMIN WITH MINERALS) TABS tablet Take 1 tablet by mouth daily.    [provider]  niacinamide 500 MG tablet Take 500 mg by mouth daily. Reported on 12/20/2015    [provider]  NOVOLOG FLEXPEN 100 UNIT/ML FlexPen For Fingerstick 121 to 150 give 1 unit subcutanous injection; 151-200- 2 units; 201-250 3 units; 251-300 5 units; 301-350 7 units; greater than 351 9 units. Patient taking differently: Inject 2 Units into the skin 3 (three) times daily with meals. For Fingerstick 121 to 150 give 1 unit subcutanous injection; 151-200- 2 units; 201-250 3 units; 251-300 5 units; 301-350 7 units; greater than 351 9 units. 11/28/15   Loletha Grayer, MD  omeprazole (PRILOSEC) 20 MG capsule Take 20 mg by mouth daily.    [provider]  Potassium Gluconate 550 (90 K) MG TABS Take 90 mg by mouth every other day.    [provider]  pyridOXINE (B-6) 50 MG tablet Take 50 mg by mouth daily.     [provider]  spironolactone (ALDACTONE) 25 MG tablet Take  25 mg by mouth daily.  10/10/15   [provider]  tamsulosin (FLOMAX) 0.4 MG CAPS capsule Take 1 capsule (0.4 mg total) by mouth daily. 12/20/15   Zara Council A, PA-C  testosterone cypionate (DEPOTESTOSTERONE CYPIONATE) 200 MG/ML injection Inject 200 mg into the muscle every 28 (twenty-eight) days. Due 05-30-16 06/22/15   [provider]  warfarin (COUMADIN) 2 MG tablet Take 1 tablet (2 mg total) by mouth daily at 6 PM. Patient not taking: Reported on 05/18/2016 11/28/15   Loletha Grayer, MD  warfarin (COUMADIN) 2.5 MG tablet Take 2.5 mg by mouth See admin instructions. Takes 2.5 mg 5 days a  week and 5 mg 2 days week    [provider]  warfarin (COUMADIN) 5 MG tablet Take 5 mg by mouth See admin instructions. Takes 5 mg 2 days a week and 2.5 mg 5 days a week    [provider]    Allergies Ciprofloxacin; Levaquin [levofloxacin]; Lidocaine; Metformin; and Tannic acid  Family History  Problem Relation Age of Onset  . CAD    . Diabetes    . Hypertension    . Kidney disease Neg Hx   . Prostate cancer Neg Hx     Social History Social History  Substance Use Topics  . Smoking status: Former Smoker    Types: Cigarettes    Quit date: 05/22/1984  . Smokeless tobacco: Never Used  . Alcohol use 0.0 - 0.6 oz/week     Comment: 1 glass of wine per month    Review of Systems Constitutional: No fever/chills Eyes: No visual changes. ENT: No sore throat. Cardiovascular: Denies chest pain. Respiratory: Denies shortness of breath. Gastrointestinal: No abdominal pain.  No nausea, no vomiting.  No diarrhea.  No constipation. Genitourinary: Negative for dysuria. Musculoskeletal: Negative for back pain. Integumentary: Negative for rash. Neurological: Positive for headaches, negative for focal weakness or numbness.   ____________________________________________   PHYSICAL EXAM:  VITAL SIGNS: ED Triage Vitals [11/21/16 2213]  Enc Vitals Group     BP (!)  164/82     Pulse Rate 72     Resp 18     Temp 98 F (36.7 C)     Temp Source Oral     SpO2 98 %     Weight 201 lb (91.2 kg)     Height 6\' 1"  (1.854 m)     Head Circumference      Peak Flow      Pain Score 6     Pain Loc      Pain Edu?      Excl. in [place]?     Constitutional: Alert and oriented. Well appearing and in no acute distress. Eyes: Conjunctivae are normal. PERRL. EOMI. Head: Atraumatic. Ears:  Healthy appearing ear canals and TMs bilaterally Mouth/Throat: Mucous membranes are moist. Oropharynx non-erythematous. Neck: No stridor.  No meningeal signs.  No cervical spine tenderness to palpation. Cardiovascular: Normal rate, regular rhythm. Good peripheral circulation. Grossly normal heart sounds. Respiratory: Normal respiratory effort.  No retractions. Lungs CTAB. Gastrointestinal: Soft and nontender. No distention.  Musculoskeletal: No lower extremity tenderness nor edema. No gross deformities of extremities. Neurologic:  Normal speech and language. No gross focal neurologic deficits are appreciated.  Skin:  Skin is warm, dry and intact. No rash noted. Psychiatric: Mood and affect are normal. Speech and behavior are normal.  ____________________________________________   LABS (all labs ordered are listed, but only abnormal results are displayed)  Labs Reviewed  CBC - Abnormal; Notable for the following:       Result Value   RDW 14.9 (*)    Platelets 140 (*)    All other components within normal limits  COMPREHENSIVE METABOLIC PANEL  PROTIME-INR  URINALYSIS, COMPLETE (UACMP) WITH MICROSCOPIC   ____________________________________________  EKG  ED ECG REPORT I, [place] N BROWN, the attending physician, personally viewed and interpreted this ECG.   Date: 11/23/2016  EKG Time: 10:25 PM  Rate: 70  Rhythm: Normal sinus rhythm  Axis: Normal  Intervals: Normal  ST&T Change: None  ____________________________________________  RADIOLOGY I, [place] N BROWN,  personally viewed and evaluated these images (plain radiographs) as  part of my medical decision making, as well as reviewing the written report by the radiologist.  CLINICAL DATA:  83 y/o  M; headache, neck pain, and dizziness.  EXAM: CT ANGIOGRAPHY HEAD AND NECK  TECHNIQUE: Multidetector CT imaging of the head and neck was performed using the standard protocol during bolus administration of intravenous contrast. Multiplanar CT image reconstructions and MIPs were obtained to evaluate the vascular anatomy. Carotid stenosis measurements (when applicable) are obtained utilizing NASCET criteria, using the distal internal carotid diameter as the denominator.  CONTRAST:  75 cc Isovue 370  COMPARISON:  11/04/2012 CT of the neck.  FINDINGS: CT HEAD FINDINGS  Brain: No evidence of acute infarction, hemorrhage, hydrocephalus, extra-axial collection or mass lesion/mass effect. Mild chronic microvascular ischemic changes of white matter and parenchymal volume loss of the brain. Stable calcified density in the right anterior lentiform nucleus.  Vascular: As below.  Skull: Normal. Negative for fracture or focal lesion.  Sinuses: Imaged portions are clear.  Orbits: No acute finding.  Review of the MIP images confirms the above findings  CTA NECK FINDINGS  Aortic arch: Standard branching. Imaged portion shows no evidence of aneurysm or dissection. No significant stenosis of the major arch vessel origins. Mild calcific atherosclerosis of the arch.  Right carotid system: No evidence of dissection, stenosis (50% or greater) or occlusion. Mild calcific atherosclerosis of the right carotid bifurcation with minimal proximal ICA stenosis.  Left carotid system: No evidence of dissection, stenosis (50% or greater) or occlusion. Mild calcific atherosclerosis of left carotid bifurcation without significant stenosis.  Vertebral arteries: Left dominant. No evidence of  dissection, stenosis (50% or greater) or occlusion.  Skeleton: Advanced cervical spondylosis with mild reversal of curvature at the C5 through C7 levels. Discogenic disease results in at least moderate canal stenosis at the C5 through C7 levels.  Other neck: Negative.  Upper chest: Negative.  Review of the MIP images confirms the above findings  CTA HEAD FINDINGS  Anterior circulation: Short segment of moderate severe distal right M1 stenosis. Moderate calcific atherosclerosis of cavernous and paraclinoid segments of internal carotid artery is with mild bilateral paraclinoid stenosis.  Posterior circulation: No significant stenosis, proximal occlusion, aneurysm, or vascular malformation.  Venous sinuses: As permitted by contrast timing, patent.  Anatomic variants: Small anterior and posterior communicating arteries.  Delayed phase: Calcified density in the right anterior lentiform nucleus is associated with a prominent branching draining vein. 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  Review of the MIP images confirms the above findings  IMPRESSION: 1. No acute intracranial abnormality identified. 2. 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 3. Mild chronic microvascular ischemic changes and mild parenchymal volume loss of the brain. 4. Patent carotid and vertebral arteries without evidence for high-grade stenosis or dissection. 5. Patent circle of Willis. No large vessel occlusion or aneurysm identified. 6. Distal right M1 short segment of moderate to severe stenosis. 7. Calcified plaque of cavernous and paraclinoid internal carotid arteries with mild bilateral paraclinoid stenosis.   Electronically Signed   By: Kristine Garbe M.D.   On: 11/22/2016  00:22   Procedures   ____________________________________________   INITIAL IMPRESSION / ASSESSMENT AND PLAN / ED COURSE  Pertinent labs & imaging results that were available during my care of the patient were reviewed by me and considered in my medical decision making (see chart for details).   83 year old male presenting with headache negative CT head  and CT angiogram head and neck. Patient pain resolved at this time following IV analgesia.     ____________________________________________  FINAL CLINICAL IMPRESSION(S) / ED DIAGNOSES  Final diagnoses:  Other migraine without status migrainosus, not intractable     MEDICATIONS GIVEN DURING THIS VISIT:  Medications - No data to display   NEW OUTPATIENT MEDICATIONS STARTED DURING THIS VISIT:  New Prescriptions   No medications on file    Modified Medications   No medications on file    Discontinued Medications   No medications on file     Note:  This document was prepared using Dragon voice recognition software and may include unintentional dictation errors.    Gregor Hams, MD 11/23/16 0630

## 2016-11-21 NOTE — ED Notes (Signed)
Patient reports his INR was elevated when it was drawn yesterday.

## 2016-11-21 NOTE — ED Notes (Signed)
RN to bedside to administer medication. Pt at CT

## 2016-11-21 NOTE — ED Notes (Signed)
MD Brown at bedside.

## 2016-11-21 NOTE — ED Triage Notes (Addendum)
Patient ambulatory to triage with steady gait, without difficulty or distress noted; pt reports generalized HA since last night, relieved by hydrocodone but returned today with neck pain; "I can feel my pulse everywhere in my body, shaky, my ears are ringing"; PT/INR was elevated yesterday and coumadin was decreased; f/u sched for 10 days; also having urinary frequency; pt taken to room 8 by EDT Mayra to be placed on card monitor for EKG & further evaluation

## 2016-11-21 NOTE — ED Notes (Signed)
Patient c/o headache and epigastric pain X 2 days. Patient denies weakness, visual changes, dizziness, nausea/vomiting/diarrhea.

## 2016-11-21 NOTE — ED Notes (Signed)
MD Owens Shark updated patient on plan of care

## 2016-11-22 DIAGNOSIS — C44219 Basal cell carcinoma of skin of left ear and external auricular canal: Secondary | ICD-10-CM | POA: Diagnosis not present

## 2016-11-22 DIAGNOSIS — L711 Rhinophyma: Secondary | ICD-10-CM | POA: Diagnosis not present

## 2016-11-22 DIAGNOSIS — L821 Other seborrheic keratosis: Secondary | ICD-10-CM | POA: Diagnosis not present

## 2016-11-22 DIAGNOSIS — L820 Inflamed seborrheic keratosis: Secondary | ICD-10-CM | POA: Diagnosis not present

## 2016-11-22 DIAGNOSIS — D485 Neoplasm of uncertain behavior of skin: Secondary | ICD-10-CM | POA: Diagnosis not present

## 2016-11-22 MED ORDER — MORPHINE SULFATE (PF) 4 MG/ML IV SOLN
4.0000 mg | Freq: Once | INTRAVENOUS | Status: AC
  Administered 2016-11-22: 4 mg via INTRAVENOUS

## 2016-11-22 MED ORDER — MORPHINE SULFATE (PF) 4 MG/ML IV SOLN
INTRAVENOUS | Status: AC
  Administered 2016-11-22: 4 mg via INTRAVENOUS
  Filled 2016-11-22: qty 1

## 2016-11-22 NOTE — ED Notes (Signed)
Patient reports area of severe tenderness on posterior neck

## 2016-11-30 DIAGNOSIS — I10 Essential (primary) hypertension: Secondary | ICD-10-CM | POA: Diagnosis not present

## 2016-11-30 DIAGNOSIS — R1013 Epigastric pain: Secondary | ICD-10-CM | POA: Diagnosis not present

## 2016-11-30 DIAGNOSIS — I480 Paroxysmal atrial fibrillation: Secondary | ICD-10-CM | POA: Diagnosis not present

## 2016-12-03 DIAGNOSIS — Z7901 Long term (current) use of anticoagulants: Secondary | ICD-10-CM | POA: Diagnosis not present

## 2016-12-03 DIAGNOSIS — R1013 Epigastric pain: Secondary | ICD-10-CM | POA: Diagnosis not present

## 2016-12-03 DIAGNOSIS — Z5181 Encounter for therapeutic drug level monitoring: Secondary | ICD-10-CM | POA: Diagnosis not present

## 2016-12-06 DIAGNOSIS — L821 Other seborrheic keratosis: Secondary | ICD-10-CM | POA: Diagnosis not present

## 2016-12-06 DIAGNOSIS — E782 Mixed hyperlipidemia: Secondary | ICD-10-CM | POA: Diagnosis not present

## 2016-12-06 DIAGNOSIS — C44219 Basal cell carcinoma of skin of left ear and external auricular canal: Secondary | ICD-10-CM | POA: Diagnosis not present

## 2016-12-06 DIAGNOSIS — L812 Freckles: Secondary | ICD-10-CM | POA: Diagnosis not present

## 2016-12-06 DIAGNOSIS — Z85828 Personal history of other malignant neoplasm of skin: Secondary | ICD-10-CM | POA: Diagnosis not present

## 2016-12-06 DIAGNOSIS — Z1283 Encounter for screening for malignant neoplasm of skin: Secondary | ICD-10-CM | POA: Diagnosis not present

## 2016-12-06 DIAGNOSIS — I340 Nonrheumatic mitral (valve) insufficiency: Secondary | ICD-10-CM | POA: Diagnosis not present

## 2016-12-06 DIAGNOSIS — L718 Other rosacea: Secondary | ICD-10-CM | POA: Diagnosis not present

## 2016-12-06 DIAGNOSIS — I472 Ventricular tachycardia: Secondary | ICD-10-CM | POA: Diagnosis not present

## 2016-12-06 DIAGNOSIS — L820 Inflamed seborrheic keratosis: Secondary | ICD-10-CM | POA: Diagnosis not present

## 2016-12-06 DIAGNOSIS — Z95810 Presence of automatic (implantable) cardiac defibrillator: Secondary | ICD-10-CM | POA: Diagnosis not present

## 2016-12-06 DIAGNOSIS — L570 Actinic keratosis: Secondary | ICD-10-CM | POA: Diagnosis not present

## 2016-12-06 DIAGNOSIS — L578 Other skin changes due to chronic exposure to nonionizing radiation: Secondary | ICD-10-CM | POA: Diagnosis not present

## 2016-12-17 NOTE — Progress Notes (Signed)
12/18/2016 9:16 AM   Dakota Thomas 1933-10-29 [id number]  Referring provider: Tracie Harrier, [place] [facility] [place], [place] [address]  Chief Complaint  Patient presents with  . Benign Prostatic Hypertrophy    1 year follow up     HPI: Patient is a 83 year old Caucasian male with BPH and LU TS with a history of a hospital admission for bacteremia with enterococcus in 11/2015.    BPH WITH LUTS His IPSS score today is 35, which is severe lower urinary tract symptomatology. He is pleased with his quality life due to his urinary symptoms.  His PVR is 74 mL.  His major complaints today are frequency, urgency, nocturia, incontinence, intermittency, hesitancy, straining to urinate and a weak urinary stream    He is also having right inguinal pain after urination.  This has been going on for six months.  8/10 pain, lasting for a few seconds.  He describes the pain as a pressure sensation that is relieved with the urination.  He denies any dysuria, hematuria or suprapubic pain.  His UA in the ED for a visit for HA on 11/21/2016 was negative.  He currently taking tamsulosin 0.4 mg daily.    His has had TUMT about 10 years ago.  He also denies any recent fevers, chills, nausea or vomiting.       IPSS    Row Name 12/18/16 0800         International Prostate Symptom Score   How often have you had the sensation of not emptying your bladder? Almost always     How often have you had to urinate less than every two hours? Almost always     How often have you found you stopped and started again several times when you urinated? Almost always     How often have you found it difficult to postpone urination? Almost always     How often have you had a weak urinary stream? Almost always     How often have you had to strain to start urination? Almost always     How many times did you typically get up at night to urinate? 5 Times     Total IPSS Score 35       Quality  of Life due to urinary symptoms   If you were to spend the rest of your life with your urinary condition just the way it is now how would you feel about that? Pleased        Score:  1-7 Mild 8-19 Moderate 20-35 Severe  A CT renal stone study completed on 10/01/2015 for right lower quadrant pain did not identify any renal, ureteral or bladder stones.  He was on testosterone cypionate prescribed by his primary care physician, but it was not continued in the hospital.    PMH: Past Medical History:  Diagnosis Date  . A-fib ([place])   . AICD (automatic cardioverter/defibrillator) present   . Allergic state   . Anxiety   . Arrhythmia    atrial fib  . BPH (benign prostatic hyperplasia)   . CAD (coronary artery disease)   . Cancer (HCC)    skin of the nose and arm  . Diabetes mellitus without complication ([place])   . Family history of adverse reaction to anesthesia    daughter sleeps a long time  . H/O diverticulitis of colon   . Heart attack ([place]) 1985, 1995,2000, 2004,    multiple MI's  .  Heart disease   . Heartburn   . History of kidney stones   . History of stomach ulcers   . HLD (hyperlipidemia)   . Hypertension   . Hypothyroidism   . Ischemic cardiomyopathy   . Kidney stones   . Neuropathy   . Pacemaker   . Post herpetic neuralgia    left side of face  . Renal disorder    UTI's  . Rosacea   . Testosterone deficiency   . Tremors of nervous system   . Trigeminal neuralgia of left side of face     Surgical History: Past Surgical History:  Procedure Laterality Date  . APPENDECTOMY    . BACK SURGERY     cervical diskectomy  . CHOLECYSTECTOMY    . COLONOSCOPY WITH PROPOFOL N/A 01/27/2016   Procedure: COLONOSCOPY WITH PROPOFOL;  Surgeon: Manya Silvas, MD;  Location: [facility] ENDOSCOPY;  Service: Endoscopy;  Laterality: N/A;  . CORONARY ANGIOPLASTY WITH STENT PLACEMENT     multiple times  . CORONARY ARTERY BYPASS GRAFT    . eyelids    . IMPLANTABLE CARDIOVERTER  DEFIBRILLATOR (ICD) GENERATOR CHANGE Left 05/30/2016   Procedure: ICD GENERATOR CHANGE;  Surgeon: Isaias Cowman, MD;  Location: [facility] ORS;  Service: Cardiovascular;  Laterality: Left;  . kidney stones    . NASAL SEPTUM SURGERY    . ostate surgery    . PACEMAKER INSERTION    . prostate heating     seven years ago  . TEE WITHOUT CARDIOVERSION N/A 11/28/2015   Procedure: Transesophageal Echocardiogram (Tee);  Surgeon: Teodoro Spray, MD;  Location: [facility] ORS;  Service: Cardiovascular;  Laterality: N/A;  . THYROID SURGERY    . TONSILLECTOMY     x 2    Home Medications:  Allergies as of 12/18/2016      Reactions   Ciprofloxacin Other (See Comments)   Makes him feel horrible   Levaquin [levofloxacin] Swelling   Reaction: swelling of throat    Lidocaine Other (See Comments)   Unspecified   Metformin Other (See Comments)   Esophageal Spasms   Tannic Acid Hives      Medication List       Accurate as of 12/18/16  9:16 AM. Always use your most recent med list.          acetaminophen 500 MG tablet Commonly known as:  TYLENOL Take 1,000 mg by mouth every 6 (six) hours as needed for mild pain.   ALPRAZolam 0.5 MG tablet Commonly known as:  XANAX Take 0.5 mg by mouth 2 (two) times daily as needed (tremors).   amiodarone 200 MG tablet Commonly known as:  PACERONE Take 100 mg by mouth daily.   ASPERCREME W/LIDOCAINE EX Apply 1 application topically daily as needed (back pain).   atorvastatin 40 MG tablet Commonly known as:  LIPITOR Take 40 mg by mouth daily.   cephALEXin 250 MG capsule Commonly known as:  KEFLEX Take 1 capsule (250 mg total) by mouth 4 (four) times daily.   cloNIDine 0.1 MG tablet Commonly known as:  CATAPRES Take by mouth.   gabapentin 600 MG tablet Commonly known as:  NEURONTIN Take 600 mg by mouth 2 (two) times daily.   HYDROcodone-acetaminophen 5-325 MG tablet Commonly known as:  NORCO/VICODIN Take by mouth.   insulin glargine 100 unit/mL  Sopn Commonly known as:  LANTUS Inject 25 Units into the skin at bedtime.   levothyroxine 100 MCG tablet Commonly known as:  SYNTHROID, LEVOTHROID Take 100 mcg by mouth  daily before breakfast.   metoprolol succinate 25 MG 24 hr tablet Commonly known as:  TOPROL-XL Take 25 mg by mouth daily.   multivitamin with minerals Tabs tablet Take 1 tablet by mouth daily.   omeprazole 20 MG capsule Commonly known as:  PRILOSEC Take 20 mg by mouth daily.   Potassium Gluconate 550 (90 K) MG Tabs Take 90 mg by mouth every other day.   pyridOXINE 50 MG tablet Commonly known as:  B-6 Take 100 mg by mouth daily.   spironolactone 25 MG tablet Commonly known as:  ALDACTONE Take 25 mg by mouth daily.   tamsulosin 0.4 MG Caps capsule Commonly known as:  FLOMAX Take 2 capsules (0.8 mg total) by mouth daily.   warfarin 2.5 MG tablet Commonly known as:  COUMADIN Take 2.5 mg by mouth daily at 6 PM.       Allergies:  Allergies  Allergen Reactions  . Ciprofloxacin Other (See Comments)    Makes him feel horrible  . Levaquin [Levofloxacin] Swelling    Reaction: swelling of throat   . Lidocaine Other (See Comments)    Unspecified  . Metformin Other (See Comments)    Esophageal Spasms  . Tannic Acid Hives    Family History: Family History  Problem Relation Age of Onset  . CAD Unknown   . Diabetes Unknown   . Hypertension Unknown   . Kidney disease Neg Hx   . Prostate cancer Neg Hx   . Kidney cancer Neg Hx   . Bladder Cancer Neg Hx     Social History:  reports that he quit smoking about 32 years ago. His smoking use included Cigarettes. He has never used smokeless tobacco. He reports that he drinks alcohol. He reports that he does not use drugs.  ROS: UROLOGY Frequent Urination?: Yes Hard to postpone urination?: Yes Burning/pain with urination?: Yes Get up at night to urinate?: Yes Leakage of urine?: Yes Urine stream starts and stops?: Yes Trouble starting stream?: Yes Do  you have to strain to urinate?: Yes Blood in urine?: No Urinary tract infection?: No Sexually transmitted disease?: No Injury to kidneys or bladder?: No Painful intercourse?: No Weak stream?: Yes Erection problems?: No Penile pain?: No  Gastrointestinal Nausea?: No Vomiting?: No Indigestion/heartburn?: No Diarrhea?: Yes Constipation?: No  Constitutional Fever: No Night sweats?: No Weight loss?: No Fatigue?: No  Skin Skin rash/lesions?: No Itching?: No  Eyes Blurred vision?: No Double vision?: No  Ears/Nose/Throat Sore throat?: No Sinus problems?: No  Hematologic/Lymphatic Swollen glands?: No Easy bruising?: No  Cardiovascular Leg swelling?: No Chest pain?: No  Respiratory Cough?: No Shortness of breath?: No  Endocrine Excessive thirst?: No  Musculoskeletal Back pain?: Yes Joint pain?: No  Neurological Headaches?: Yes Dizziness?: No  Psychologic Depression?: No Anxiety?: No  Physical Exam: BP 134/78   Pulse 64   Ht 6\' 1"  (1.854 m)   Wt 202 lb 12.8 oz (92 kg)   BMI 26.76 kg/m   Constitutional: Well nourished. Alert and oriented, No acute distress. HEENT: [place] AT, moist mucus membranes. Trachea midline, no masses. Cardiovascular: No clubbing, cyanosis, or edema. Respiratory: Normal respiratory effort, no increased work of breathing. GI: Abdomen is soft, non tender, non distended, no abdominal masses. Liver and spleen not palpable.  No hernias appreciated.  Stool sample for occult testing is not indicated.   GU: No CVA tenderness.  No bladder fullness or masses.  Patient with circumcised phallus.   Urethral meatus is patent.  No penile discharge. No penile  lesions or rashes. Scrotum without lesions, cysts, rashes and/or edema.  Testicles are located scrotally bilaterally. No masses are appreciated in the testicles. Left and right epididymis are normal.  Small right inguinal hernia present.   Rectal: Patient with  normal sphincter tone. Anus and  perineum without scarring or rashes. No rectal masses are appreciated. Prostate is approximately 55 grams, no nodules are appreciated. Seminal vesicles are normal. Skin: No rashes, bruises or suspicious lesions. Lymph: No cervical or inguinal adenopathy. Neurologic: Grossly intact, no focal deficits, moving all 4 extremities. Psychiatric: Normal mood and affect.  Laboratory Data: Lab Results  Component Value Date   WBC 4.6 11/21/2016   HGB 14.0 11/21/2016   HCT 42.1 11/21/2016   MCV 91.1 11/21/2016   PLT 140 (L) 11/21/2016    Lab Results  Component Value Date   CREATININE 1.18 11/21/2016     Lab Results  Component Value Date   HGBA1C 6.9 (H) 11/22/2015      Lab Results  Component Value Date   AST 29 11/21/2016   Lab Results  Component Value Date   ALT 18 11/21/2016    Results for orders placed or performed in visit on 12/18/16  BLADDER SCAN AMB NON-IMAGING  Result Value Ref Range   Scan Result 74      Assessment & Plan:    1. Right inguinal pain/hernia  - ? Due to straining to urinate - PVR is minimal - small hernia present - reducible - will continue to monitor  - red flags reviewed - intractable pain, intractable nausea and/or vomiting, fever or unable to reduce the hernia  2. BPH with LUTS:     -IPSS score is 35/1, It is worse  - We discussed increasing the tamsulosin to 0.8 mg daily, adding finasteride 5 mg daily or undergoing cystoscopy to see if he is a candidate for bladder outlet procedure- he would like to give a trial of tamsulosin 0.8 mg daily -prescription sent to pharmacy   -RTC in 12 months for IPSS and exam   3. History of bacteremia  - UA was unremarkable in the emergency room 2 weeks ago  - Asymptomatic at this time    Return in about 1 year (around 12/18/2017) for IPSS and PVR.  These notes generated with voice recognition software. I apologize for typographical errors.  Zara Council, [facility] [address] [place], [place] [address] [phone]

## 2016-12-18 ENCOUNTER — Encounter: Payer: Self-pay | Admitting: Urology

## 2016-12-18 ENCOUNTER — Ambulatory Visit (INDEPENDENT_AMBULATORY_CARE_PROVIDER_SITE_OTHER): Payer: Medicare HMO | Admitting: Urology

## 2016-12-18 VITALS — BP 134/78 | HR 64 | Ht 73.0 in | Wt 202.8 lb

## 2016-12-18 DIAGNOSIS — N138 Other obstructive and reflux uropathy: Secondary | ICD-10-CM | POA: Diagnosis not present

## 2016-12-18 DIAGNOSIS — B952 Enterococcus as the cause of diseases classified elsewhere: Secondary | ICD-10-CM

## 2016-12-18 DIAGNOSIS — R7881 Bacteremia: Secondary | ICD-10-CM | POA: Diagnosis not present

## 2016-12-18 DIAGNOSIS — N400 Enlarged prostate without lower urinary tract symptoms: Secondary | ICD-10-CM | POA: Diagnosis not present

## 2016-12-18 DIAGNOSIS — N401 Enlarged prostate with lower urinary tract symptoms: Secondary | ICD-10-CM | POA: Diagnosis not present

## 2016-12-18 DIAGNOSIS — K4090 Unilateral inguinal hernia, without obstruction or gangrene, not specified as recurrent: Secondary | ICD-10-CM

## 2016-12-18 LAB — BLADDER SCAN AMB NON-IMAGING: Scan Result: 74

## 2016-12-18 MED ORDER — TAMSULOSIN HCL 0.4 MG PO CAPS: 0.8000 mg | ORAL_CAPSULE | Freq: Every day | ORAL | 3 refills | Status: DC

## 2016-12-21 DIAGNOSIS — Z0000 Encounter for general adult medical examination without abnormal findings: Secondary | ICD-10-CM | POA: Diagnosis not present

## 2016-12-21 DIAGNOSIS — Z125 Encounter for screening for malignant neoplasm of prostate: Secondary | ICD-10-CM | POA: Diagnosis not present

## 2016-12-21 DIAGNOSIS — Z794 Long term (current) use of insulin: Secondary | ICD-10-CM | POA: Diagnosis not present

## 2016-12-21 DIAGNOSIS — L719 Rosacea, unspecified: Secondary | ICD-10-CM | POA: Diagnosis not present

## 2016-12-21 DIAGNOSIS — I5022 Chronic systolic (congestive) heart failure: Secondary | ICD-10-CM | POA: Diagnosis not present

## 2016-12-21 DIAGNOSIS — I340 Nonrheumatic mitral (valve) insufficiency: Secondary | ICD-10-CM | POA: Diagnosis not present

## 2016-12-21 DIAGNOSIS — E119 Type 2 diabetes mellitus without complications: Secondary | ICD-10-CM | POA: Diagnosis not present

## 2016-12-21 DIAGNOSIS — I10 Essential (primary) hypertension: Secondary | ICD-10-CM | POA: Diagnosis not present

## 2016-12-21 DIAGNOSIS — I25810 Atherosclerosis of coronary artery bypass graft(s) without angina pectoris: Secondary | ICD-10-CM | POA: Diagnosis not present

## 2016-12-25 DIAGNOSIS — I480 Paroxysmal atrial fibrillation: Secondary | ICD-10-CM | POA: Diagnosis not present

## 2016-12-28 DIAGNOSIS — E039 Hypothyroidism, unspecified: Secondary | ICD-10-CM | POA: Diagnosis not present

## 2016-12-28 DIAGNOSIS — E119 Type 2 diabetes mellitus without complications: Secondary | ICD-10-CM | POA: Diagnosis not present

## 2016-12-28 DIAGNOSIS — I25810 Atherosclerosis of coronary artery bypass graft(s) without angina pectoris: Secondary | ICD-10-CM | POA: Diagnosis not present

## 2016-12-28 DIAGNOSIS — I255 Ischemic cardiomyopathy: Secondary | ICD-10-CM | POA: Diagnosis not present

## 2016-12-28 DIAGNOSIS — I340 Nonrheumatic mitral (valve) insufficiency: Secondary | ICD-10-CM | POA: Diagnosis not present

## 2016-12-28 DIAGNOSIS — Z794 Long term (current) use of insulin: Secondary | ICD-10-CM | POA: Diagnosis not present

## 2016-12-28 DIAGNOSIS — L719 Rosacea, unspecified: Secondary | ICD-10-CM | POA: Diagnosis not present

## 2017-01-01 DIAGNOSIS — I4891 Unspecified atrial fibrillation: Secondary | ICD-10-CM | POA: Diagnosis not present

## 2017-01-29 DIAGNOSIS — I4891 Unspecified atrial fibrillation: Secondary | ICD-10-CM | POA: Diagnosis not present

## 2017-02-12 DIAGNOSIS — Z7901 Long term (current) use of anticoagulants: Secondary | ICD-10-CM | POA: Diagnosis not present

## 2017-02-12 DIAGNOSIS — Z5181 Encounter for therapeutic drug level monitoring: Secondary | ICD-10-CM | POA: Diagnosis not present

## 2017-02-14 ENCOUNTER — Telehealth: Payer: Self-pay | Admitting: Urology

## 2017-02-14 NOTE — Progress Notes (Signed)
02/15/2017 11:50 AM   Dakota Thomas [birth year] [id number]  Referring provider: Tracie Harrier, [place] [facility] [place], [place] [address]  Chief Complaint  Patient presents with  . Medication Reaction    patient wants to discuss medication possible side effects    HPI: Patient is a 83 year old Caucasian male with BPH and LU TS who could not tolerate alpha-blocker therapy who presents today to discuss other options.  BPH WITH LUTS His IPSS score today is 7 (with the tamsulosin) and 30 (without tamsulosin), which is mild/severe lower urinary tract symptomatology. He is mixed with his quality life due to his urinary symptoms.  His PVR is 36 mL.  His previous I PSS score was 35/1.  His previous PVR was 74 mL.  His major complaint(s) today is/are frequency, urgency, nocturia, incontinence, intermittency, hesitancy, straining to urinate and a weak urinary stream    He denies any dysuria, hematuria or suprapubic pain.  His UA in the ED for a visit for HA on 11/21/2016 was negative.  His has had TUMT about 10 years ago.  He also denies any recent fevers, chills, nausea or vomiting.       IPSS    Row Name 12/18/16 0800 02/15/17 1000       International Prostate Symptom Score   How often have you had the sensation of not emptying your bladder? Almost always Less than 1 in 5    How often have you had to urinate less than every two hours? Almost always Less than 1 in 5 times    How often have you found you stopped and started again several times when you urinated? Almost always Less than 1 in 5 times    How often have you found it difficult to postpone urination? Almost always Less than 1 in 5 times    How often have you had a weak urinary stream? Almost always Less than 1 in 5 times    How often have you had to strain to start urination? Almost always Less than 1 in 5 times    How many times did you typically get up at night to urinate? 5 Times 1 Time    Total IPSS Score 35 7      Quality of Life due to urinary symptoms   If you were to spend the rest of your life with your urinary condition just the way it is now how would you feel about that? Pleased Mixed       Score:  1-7 Mild 8-19 Moderate 20-35 Severe  A CT renal stone study completed on 10/01/2015 for right lower quadrant pain did not identify any renal, ureteral or bladder stones.  He was on testosterone cypionate prescribed by his primary care physician, but it was not continued in the hospital.    PMH: Past Medical History:  Diagnosis Date  . A-fib ([place])   . AICD (automatic cardioverter/defibrillator) present   . Allergic state   . Anxiety   . Arrhythmia    atrial fib  . BPH (benign prostatic hyperplasia)   . CAD (coronary artery disease)   . Cancer (HCC)    skin of the nose and arm  . Diabetes mellitus without complication ([place])   . Family history of adverse reaction to anesthesia    daughter sleeps a long time  . H/O diverticulitis of colon   . Heart attack ([place]) 1985, 1995,2000, 2004,    multiple MI's  .  Heart disease   . Heartburn   . History of kidney stones   . History of stomach ulcers   . HLD (hyperlipidemia)   . Hypertension   . Hypothyroidism   . Ischemic cardiomyopathy   . Kidney stones   . Neuropathy   . Pacemaker   . Post herpetic neuralgia    left side of face  . Renal disorder    UTI's  . Rosacea   . Testosterone deficiency   . Tremors of nervous system   . Trigeminal neuralgia of left side of face     Surgical History: Past Surgical History:  Procedure Laterality Date  . APPENDECTOMY    . BACK SURGERY     cervical diskectomy  . CHOLECYSTECTOMY    . COLONOSCOPY WITH PROPOFOL N/A 01/27/2016   Procedure: COLONOSCOPY WITH PROPOFOL;  Surgeon: Manya Silvas, MD;  Location[facility] ENDOSCOPY;  Service: Endoscopy;  Laterality: N/A;  . CORONARY ANGIOPLASTY WITH STENT PLACEMENT     multiple times  . CORONARY ARTERY BYPASS GRAFT    .  eyelids    . IMPLANTABLE CARDIOVERTER DEFIBRILLATOR (ICD) GENERATOR CHANGE Left 05/30/2016   Procedure: ICD GENERATOR CHANGE;  Surgeon: Isaias Cowman, MD;  Location: [facility] ORS;  Service: Cardiovascular;  Laterality: Left;  . kidney stones    . NASAL SEPTUM SURGERY    . ostate surgery    . PACEMAKER INSERTION    . prostate heating     seven years ago  . TEE WITHOUT CARDIOVERSION N/A 11/28/2015   Procedure: Transesophageal Echocardiogram (Tee);  Surgeon: Teodoro Spray, MD;  Location: [facility] ORS;  Service: Cardiovascular;  Laterality: N/A;  . THYROID SURGERY    . TONSILLECTOMY     x 2    Home Medications:  Allergies as of 02/15/2017      Reactions   Ciprofloxacin Other (See Comments)   Makes him feel horrible   Levaquin [levofloxacin] Swelling   Reaction: swelling of throat    Lidocaine Other (See Comments)   Unspecified   Metformin Other (See Comments)   Esophageal Spasms   Tannic Acid Hives   Tamsulosin Hcl Palpitations      Medication List       Accurate as of 02/15/17 11:50 AM. Always use your most recent med list.          acetaminophen 500 MG tablet Commonly known as:  TYLENOL Take 1,000 mg by mouth every 6 (six) hours as needed for mild pain.   ALPRAZolam 0.5 MG tablet Commonly known as:  XANAX Take 0.5 mg by mouth 2 (two) times daily as needed (tremors).   amiodarone 200 MG tablet Commonly known as:  PACERONE Take 100 mg by mouth daily.   ASPERCREME W/LIDOCAINE EX Apply 1 application topically daily as needed (back pain).   atorvastatin 40 MG tablet Commonly known as:  LIPITOR Take 40 mg by mouth daily.   cephALEXin 250 MG capsule Commonly known as:  KEFLEX Take 1 capsule (250 mg total) by mouth 4 (four) times daily.   cloNIDine 0.1 MG tablet Commonly known as:  CATAPRES Take by mouth.   gabapentin 600 MG tablet Commonly known as:  NEURONTIN Take 600 mg by mouth 2 (two) times daily.   HYDROcodone-acetaminophen 5-325 MG tablet Commonly known  as:  NORCO/VICODIN Take by mouth.   insulin glargine 100 unit/mL Sopn Commonly known as:  LANTUS Inject 25 Units into the skin at bedtime.   levothyroxine 100 MCG tablet Commonly known as:  SYNTHROID, LEVOTHROID Take  100 mcg by mouth daily before breakfast.   metoprolol succinate 25 MG 24 hr tablet Commonly known as:  TOPROL-XL Take 25 mg by mouth daily.   multivitamin with minerals Tabs tablet Take 1 tablet by mouth daily.   omeprazole 20 MG capsule Commonly known as:  PRILOSEC Take 20 mg by mouth daily.   Potassium Gluconate 550 (90 K) MG Tabs Take 90 mg by mouth every other day.   pyridOXINE 50 MG tablet Commonly known as:  B-6 Take 100 mg by mouth daily.   spironolactone 25 MG tablet Commonly known as:  ALDACTONE Take 25 mg by mouth daily.   tamsulosin 0.4 MG Caps capsule Commonly known as:  FLOMAX Take 2 capsules (0.8 mg total) by mouth daily.   testosterone cypionate 200 MG/ML injection Commonly known as:  DEPOTESTOSTERONE CYPIONATE INJECT 1 ML IN THE MUSCLE EVERY 28 DAYS   warfarin 2.5 MG tablet Commonly known as:  COUMADIN Take 2.5 mg by mouth daily at 6 PM.       Allergies:  Allergies  Allergen Reactions  . Ciprofloxacin Other (See Comments)    Makes him feel horrible  . Levaquin [Levofloxacin] Swelling    Reaction: swelling of throat   . Lidocaine Other (See Comments)    Unspecified  . Metformin Other (See Comments)    Esophageal Spasms  . Tannic Acid Hives  . Tamsulosin Hcl Palpitations    Family History: Family History  Problem Relation Age of Onset  . CAD Unknown   . Diabetes Unknown   . Hypertension Unknown   . Bladder Cancer Paternal Grandfather   . Kidney disease Neg Hx   . Prostate cancer Neg Hx   . Kidney cancer Neg Hx     Social History:  reports that he quit smoking about 32 years ago. His smoking use included Cigarettes. He has never used smokeless tobacco. He reports that he drinks alcohol. He reports that he does not  use drugs.  ROS: UROLOGY Frequent Urination?: Yes Hard to postpone urination?: Yes Burning/pain with urination?: Yes Get up at night to urinate?: Yes Leakage of urine?: No Urine stream starts and stops?: Yes Trouble starting stream?: Yes Do you have to strain to urinate?: Yes Blood in urine?: No Urinary tract infection?: No Sexually transmitted disease?: No Injury to kidneys or bladder?: No Painful intercourse?: No Weak stream?: No Erection problems?: Yes Penile pain?: No  Gastrointestinal Nausea?: No Vomiting?: No Indigestion/heartburn?: Yes Diarrhea?: No Constipation?: No  Constitutional Fever: No Night sweats?: Yes Weight loss?: No Fatigue?: Yes  Skin Skin rash/lesions?: No Itching?: No  Eyes Blurred vision?: No Double vision?: No  Ears/Nose/Throat Sore throat?: No Sinus problems?: No  Hematologic/Lymphatic Swollen glands?: No Easy bruising?: Yes  Cardiovascular Leg swelling?: No Chest pain?: No  Respiratory Cough?: No Shortness of breath?: No  Endocrine Excessive thirst?: No  Musculoskeletal Back pain?: Yes Joint pain?: No  Neurological Headaches?: Yes Dizziness?: Yes  Psychologic Depression?: No Anxiety?: No  Physical Exam: BP 123/72   Pulse (!) 56   Ht 6\' 1"  (1.854 m)   Wt 207 lb (93.9 kg)   BMI 27.31 kg/m   Constitutional: Well nourished. Alert and oriented, No acute distress. HEENT: [place] AT, moist mucus membranes. Trachea midline, no masses. Cardiovascular: No clubbing, cyanosis, or edema. Respiratory: Normal respiratory effort, no increased work of breathing. Skin: No rashes, bruises or suspicious lesions. Lymph: No cervical or inguinal adenopathy. Neurologic: Grossly intact, no focal deficits, moving all 4 extremities. Psychiatric: Normal mood and affect.  Laboratory Data: Lab Results  Component Value Date   WBC 4.6 11/21/2016   HGB 14.0 11/21/2016   HCT 42.1 11/21/2016   MCV 91.1 11/21/2016   PLT 140 (L)  11/21/2016    Lab Results  Component Value Date   CREATININE 1.18 11/21/2016    Lab Results  Component Value Date   AST 29 11/21/2016   Lab Results  Component Value Date   ALT 18 11/21/2016   Pertinent imaging Results for AKAI, DOLLARD (MRN [id number]) as of 02/15/2017 11:36  Ref. Range 02/15/2017 11:02  Scan Result Unknown 36     Assessment & Plan:    1. BPH with LUTS:     - patient had good success with TUMT therapy 10 years ago and would like to have it again - explained to patient that we do not perform TUMT, but we could refer to Dr. Yves Dill.  2. History of bacteremia  - Asymptomatic at this time    Return for refer to Dr. Yves Dill.  These notes generated with voice recognition software. I apologize for typographical errors.  Zara Council, [facility] [address] [place], [place] [address] [phone]

## 2017-02-14 NOTE — Telephone Encounter (Signed)
Would you schedule an appointment for this patient to see me when I get back to the office?

## 2017-02-15 ENCOUNTER — Ambulatory Visit (INDEPENDENT_AMBULATORY_CARE_PROVIDER_SITE_OTHER): Payer: Medicare HMO | Admitting: Urology

## 2017-02-15 ENCOUNTER — Encounter: Payer: Self-pay | Admitting: Urology

## 2017-02-15 VITALS — BP 123/72 | HR 56 | Ht 73.0 in | Wt 207.0 lb

## 2017-02-15 DIAGNOSIS — N401 Enlarged prostate with lower urinary tract symptoms: Secondary | ICD-10-CM | POA: Diagnosis not present

## 2017-02-15 DIAGNOSIS — R350 Frequency of micturition: Secondary | ICD-10-CM

## 2017-02-15 DIAGNOSIS — Z87898 Personal history of other specified conditions: Secondary | ICD-10-CM | POA: Diagnosis not present

## 2017-02-15 DIAGNOSIS — N138 Other obstructive and reflux uropathy: Secondary | ICD-10-CM | POA: Diagnosis not present

## 2017-02-15 LAB — BLADDER SCAN AMB NON-IMAGING: Scan Result: 36

## 2017-02-15 MED ORDER — TADALAFIL 5 MG PO TABS: 5.0000 mg | ORAL_TABLET | Freq: Every day | ORAL | 0 refills | Status: DC | PRN

## 2017-02-25 DIAGNOSIS — R350 Frequency of micturition: Secondary | ICD-10-CM | POA: Diagnosis not present

## 2017-02-25 DIAGNOSIS — R3915 Urgency of urination: Secondary | ICD-10-CM | POA: Diagnosis not present

## 2017-02-25 DIAGNOSIS — R351 Nocturia: Secondary | ICD-10-CM | POA: Diagnosis not present

## 2017-02-25 DIAGNOSIS — R3914 Feeling of incomplete bladder emptying: Secondary | ICD-10-CM | POA: Diagnosis not present

## 2017-02-25 DIAGNOSIS — N401 Enlarged prostate with lower urinary tract symptoms: Secondary | ICD-10-CM | POA: Diagnosis not present

## 2017-02-25 DIAGNOSIS — R3916 Straining to void: Secondary | ICD-10-CM | POA: Diagnosis not present

## 2017-02-26 DIAGNOSIS — R3914 Feeling of incomplete bladder emptying: Secondary | ICD-10-CM | POA: Diagnosis not present

## 2017-02-26 DIAGNOSIS — N401 Enlarged prostate with lower urinary tract symptoms: Secondary | ICD-10-CM | POA: Diagnosis not present

## 2017-02-26 DIAGNOSIS — Z7901 Long term (current) use of anticoagulants: Secondary | ICD-10-CM | POA: Diagnosis not present

## 2017-02-26 DIAGNOSIS — Z5181 Encounter for therapeutic drug level monitoring: Secondary | ICD-10-CM | POA: Diagnosis not present

## 2017-02-28 DIAGNOSIS — N401 Enlarged prostate with lower urinary tract symptoms: Secondary | ICD-10-CM | POA: Diagnosis not present

## 2017-03-06 DIAGNOSIS — I5022 Chronic systolic (congestive) heart failure: Secondary | ICD-10-CM | POA: Diagnosis not present

## 2017-03-06 DIAGNOSIS — I2510 Atherosclerotic heart disease of native coronary artery without angina pectoris: Secondary | ICD-10-CM | POA: Diagnosis not present

## 2017-03-06 DIAGNOSIS — R350 Frequency of micturition: Secondary | ICD-10-CM | POA: Diagnosis not present

## 2017-03-06 DIAGNOSIS — N401 Enlarged prostate with lower urinary tract symptoms: Secondary | ICD-10-CM | POA: Diagnosis not present

## 2017-03-06 DIAGNOSIS — I513 Intracardiac thrombosis, not elsewhere classified: Secondary | ICD-10-CM | POA: Diagnosis not present

## 2017-03-06 DIAGNOSIS — Z01818 Encounter for other preprocedural examination: Secondary | ICD-10-CM | POA: Diagnosis not present

## 2017-03-06 DIAGNOSIS — I10 Essential (primary) hypertension: Secondary | ICD-10-CM | POA: Diagnosis not present

## 2017-03-06 DIAGNOSIS — I340 Nonrheumatic mitral (valve) insufficiency: Secondary | ICD-10-CM | POA: Diagnosis not present

## 2017-03-06 DIAGNOSIS — R3914 Feeling of incomplete bladder emptying: Secondary | ICD-10-CM | POA: Diagnosis not present

## 2017-03-06 DIAGNOSIS — I255 Ischemic cardiomyopathy: Secondary | ICD-10-CM | POA: Diagnosis not present

## 2017-03-06 DIAGNOSIS — I480 Paroxysmal atrial fibrillation: Secondary | ICD-10-CM | POA: Diagnosis not present

## 2017-03-06 DIAGNOSIS — R3916 Straining to void: Secondary | ICD-10-CM | POA: Diagnosis not present

## 2017-03-06 DIAGNOSIS — I25810 Atherosclerosis of coronary artery bypass graft(s) without angina pectoris: Secondary | ICD-10-CM | POA: Diagnosis not present

## 2017-03-11 DIAGNOSIS — I255 Ischemic cardiomyopathy: Secondary | ICD-10-CM | POA: Diagnosis not present

## 2017-03-11 DIAGNOSIS — I513 Intracardiac thrombosis, not elsewhere classified: Secondary | ICD-10-CM | POA: Diagnosis not present

## 2017-03-11 DIAGNOSIS — I10 Essential (primary) hypertension: Secondary | ICD-10-CM | POA: Diagnosis not present

## 2017-03-11 DIAGNOSIS — Z01818 Encounter for other preprocedural examination: Secondary | ICD-10-CM | POA: Diagnosis not present

## 2017-03-11 DIAGNOSIS — I25810 Atherosclerosis of coronary artery bypass graft(s) without angina pectoris: Secondary | ICD-10-CM | POA: Diagnosis not present

## 2017-03-11 NOTE — H&P (Signed)
NAME:  Dakota Thomas, Dakota Thomas NO.:  [id number]  MEDICAL RECORD NO.:  [id number]  LOCATION:  PERIO                        FACILITY:  [facility]  PHYSICIAN:  Maryan Puls          DATE OF BIRTH:  12/22/1933  DATE OF ADMISSION:  03/11/2017 DATE OF DISCHARGE:                            HISTORY AND PHYSICAL   SAME-DAY SURGERY:  March 19, 2017.  CHIEF COMPLAINT:  Difficulty voiding.  HISTORY OF PRESENT ILLNESS:  Mr. Fini is an 83 year old Caucasian male with a 2-3 year history of progressively worsening lower urinary tract symptoms.  At the present time, he complains of incomplete emptying frequency, intermittency, urgency, weak stream, straining, and nocturia x4.  AUA symptom score was 35 with a quality of life score of 7.  He is currently being managed with tamsulosin 0.4 mg per day.  He could not tolerate increasing the tamsulosin to b.i.d.  Evaluation in the office included a prostate ultrasound which revealed trilobar BPH with a prostatic volume of 60.8 g.  Uroflow study indicated a maximum flow rate of 3 mL/sec and an average flow rate of 2 mL/second and a postvoid residual of 55 mL based upon a voided volume of 63 mL.  PSA was 1.8 ng/mL and creatinine was 1.03 ng/mL.  Cystoscopy confirmed the presence of trilobar BPH with a prominent median lobe and a 4 cm prostatic urethral length.  The patient comes in now for transurethral photo vaporization of the prostate with the GreenLight laser.  He has received cardiac clearance from Dr. Serafina Royals.  ALLERGIES:  THE PATIENT IS ALLERGIC TO CIPRO, LEVAQUIN, AND TANNIC ACID.  MEDICATIONS:  Chronic medications included Aspercreme, atorvastatin, clonidine, gabapentin, hydrocodone, insulin, levothyroxine, metoprolol, multivitamins, omeprazole, potassium gluconate, paroxetine, spironolactone, testosterone cypionate, Coumadin, and tamsulosin.  SURGICAL HISTORY: 1. Coronary artery bypass graft x3, 1994. 2. Coronary artery  stent placement. 3. Nasal septal repair. 4. Pacemaker insertion x3, most recently 1 year ago. 5. Tonsillectomy. 6. Ureteroscopic ureterolithotomy in 2008. 7. Lithotripsy with ureteroscopic ureterolithotomy in 2002. 8. Eyelid surgery.  PAST AND CURRENT MEDICAL CONDITIONS: 1. Coronary artery disease. 2. Diabetes. 3. Hypertension. 4. Hypercholesterolemia. 5. Hypothyroidism. 6. Diabetes. 7. Hypogonadism. 8. Atrial fibrillation. 9. Diverticulosis.  SOCIAL HISTORY:  The patient quit smoking in 1985 with a 33-pack-year history.  He denied alcohol use.  FAMILY HISTORY:  Father died at age 55 of COPD and heart disease. Mother died at age 76 with heart disease and diabetes.  REVIEW OF SYSTEMS:  The patient has a history of superficial skin cancer, decreased visual acuity and decreased auditory acuity, allergic rhinitis, chronic cough, intermittent diarrhea, insomnia, numbness and tingling of his toes.  He was hospitalized about a year ago with urinary sepsis.  He has a past history of kidney stones.  He denies chest pain or shortness of breath.  PHYSICAL EXAMINATION:  VITAL SIGNS:  Weight 206, height 6 feet 0 inch. GENERAL:  Well-nourished white male, in no acute distress. HEENT:  Sclerae were clear. NECK:  Supple.  No palpable cervical adenopathy. LUNGS:  Clear to auscultation. CARDIOVASCULAR:  Regular rhythm and rate without audible murmurs. ABDOMEN:  Soft and nontender abdomen. GU:  Circumcised testes, atrophic,  14 mL size each. RECTAL:  Greater than 50 g smooth, nontender prostate. NEUROMUSCULAR:  Alert and oriented x3.  IMPRESSION:  Benign prostatic hyperplasia with bladder outlet obstruction.  PLAN:  Photo vaporization of prostate with GreenLight laser.          ______________________________ Maryan Puls     MW/MEDQ  D:  03/11/2017  T:  03/11/2017  Job:  [id number]

## 2017-03-12 ENCOUNTER — Ambulatory Visit
Admission: RE | Admit: 2017-03-12 | Discharge: 2017-03-12 | Disposition: A | Discharge status: Home / Self Care | Payer: Medicare HMO | Source: Ambulatory Visit | Attending: Orthopedic Surgery | Admitting: Orthopedic Surgery

## 2017-03-12 DIAGNOSIS — N400 Enlarged prostate without lower urinary tract symptoms: Secondary | ICD-10-CM | POA: Insufficient documentation

## 2017-03-12 DIAGNOSIS — Z01812 Encounter for preprocedural laboratory examination: Secondary | ICD-10-CM | POA: Insufficient documentation

## 2017-03-12 HISTORY — DX: Heart failure, unspecified: I50.9

## 2017-03-12 LAB — BASIC METABOLIC PANEL
Anion gap: 8 (ref 5–15)
BUN: 15 mg/dL (ref 6–20)
CO2: 29 mmol/L (ref 22–32)
Calcium: 9 mg/dL (ref 8.9–10.3)
Chloride: 101 mmol/L (ref 101–111)
Creatinine, Ser: 1.02 mg/dL (ref 0.61–1.24)
GFR calc Af Amer: >60 mL/min (ref >60)
GFR calc non Af Amer: >60 mL/min (ref >60)
Glucose, Bld: 171 mg/dL — ABNORMAL HIGH (ref 65–99)
Potassium: 4.1 mmol/L (ref 3.5–5.1)
Sodium: 138 mmol/L (ref 135–145)

## 2017-03-12 LAB — CBC
HCT: 40.5 % (ref 40.0–52.0)
Hemoglobin: 13.9 g/dL (ref 13.0–18.0)
MCH: 31.2 pg (ref 26.0–34.0)
MCHC: 34.3 g/dL (ref 32.0–36.0)
MCV: 90.9 fL (ref 80.0–100.0)
Platelets: 142 10*3/uL — ABNORMAL LOW (ref 150–440)
RBC: 4.46 MIL/uL (ref 4.40–5.90)
RDW: 14.8 % — ABNORMAL HIGH (ref 11.5–14.5)
WBC: 5.6 10*3/uL (ref 3.8–10.6)

## 2017-03-12 NOTE — Pre-Procedure Instructions (Signed)
FAXED [place]

## 2017-03-12 NOTE — Pre-Procedure Instructions (Signed)
ANESTHESIA - EKG from [facility] ED visit 11/21/16. ECHO in CARE EVERYWHERE from 03/11/17. Cardiology clearance note on chart

## 2017-03-12 NOTE — Patient Instructions (Signed)
Your procedure is scheduled on: Tuesday 03/19/17 Report to [place]. 2ND FLOOR MEDICAL MALL ENTRANCE. To find out your arrival time please call [phone] between 1PM - 3PM on Monday 03/18/17.  Remember: Instructions that are not followed completely may result in serious medical risk, up to and including death, or upon the discretion of your surgeon and anesthesiologist your surgery may need to be rescheduled.    __X__ 1. Do not eat anything after midnight the night before your    procedure.  No gum chewing or hard candies.  You may drink clear   liquids up to 2 hours before you are scheduled to arrive at the   hospital for your procedure. Do not drink clear liquids within 2   hours of scheduled arrival to the hospital as this may lead to your   procedure being delayed or rescheduled.       Clear liquids include:   Water or Apple juice without pulp   Clear carbohydrate beverage such as Clearfast or Gatorade   Black coffee or Clear Tea 9no milk, no creamer, do not add anything   to the coffee or tea)    Diabetics should only drink water   __X__ 2. No Alcohol for 24 hours before or after surgery.   ____ 3. Bring all medications with you on the day of surgery if instructed.    __X__ 4. Notify your doctor if there is any change in your medical condition     (cold, fever, infections).             __X___5. No smoking within 24 hours of your surgery.     Do not wear jewelry, make-up, hairpins, clips or nail polish.  Do not wear lotions, powders, or perfumes.   Do not shave 48 hours prior to surgery. Men may shave face and neck.  Do not bring valuables to the hospital.    [facility] is not responsible for any belongings or valuables.               Contacts, dentures or bridgework may not be worn into surgery.  Leave your suitcase in the car. After surgery it may be brought to your room.  For patients admitted to the hospital, discharge time is determined by your                treatment  team.   Patients discharged the day of surgery will not be allowed to drive home.   Please read over the following fact sheets that you were given:   MRSA Information   __X__ Take these medicines the morning of surgery with A SIP OF WATER:    1. AMIODARONE  2. GABAPENTIN  3. LEVOTHYROXINE  4. METOPROLOL  5. OMEPRAZOLE  6.  ____ Fleet Enema (as directed)   ____ Use CHG Soap as directed  ____ Use inhalers on the day of surgery  ____ Stop metformin 2 days prior to surgery    ____ Take 1/2 of usual insulin dose the night before surgery and none on the morning of surgery.   __X__ Stop Coumadin/Plavix/aspirin on LAST DOSE 03/15/17  __X__ Stop Anti-inflammatories such as Advil, Aleve, Ibuprofen, Motrin, Naproxen, Naprosyn, Goodies,powder, or aspirin products.  OK to take Tylenol.   ____ Stop supplements until after surgery.    ____ Bring C-Pap to the hospital.

## 2017-03-15 ENCOUNTER — Other Ambulatory Visit: Payer: Commercial Managed Care - HMO

## 2017-03-19 ENCOUNTER — Ambulatory Visit: Payer: Medicare HMO | Admitting: Anesthesiology

## 2017-03-19 ENCOUNTER — Encounter: Payer: Self-pay | Admitting: *Deleted

## 2017-03-19 ENCOUNTER — Ambulatory Visit
Admission: RE | Admit: 2017-03-19 | Discharge: 2017-03-19 | Disposition: A | Discharge status: Home / Self Care | Payer: Medicare HMO | Source: Ambulatory Visit | Attending: Urology | Admitting: Urology

## 2017-03-19 ENCOUNTER — Encounter
Admission: RE | Disposition: A | Discharge status: Home / Self Care | Payer: Self-pay | Source: Ambulatory Visit | Attending: Urology

## 2017-03-19 DIAGNOSIS — Z79899 Other long term (current) drug therapy: Secondary | ICD-10-CM | POA: Insufficient documentation

## 2017-03-19 DIAGNOSIS — R350 Frequency of micturition: Secondary | ICD-10-CM | POA: Insufficient documentation

## 2017-03-19 DIAGNOSIS — R3914 Feeling of incomplete bladder emptying: Secondary | ICD-10-CM | POA: Insufficient documentation

## 2017-03-19 DIAGNOSIS — N401 Enlarged prostate with lower urinary tract symptoms: Secondary | ICD-10-CM | POA: Insufficient documentation

## 2017-03-19 DIAGNOSIS — I252 Old myocardial infarction: Secondary | ICD-10-CM | POA: Insufficient documentation

## 2017-03-19 DIAGNOSIS — Z87891 Personal history of nicotine dependence: Secondary | ICD-10-CM | POA: Diagnosis not present

## 2017-03-19 DIAGNOSIS — I4891 Unspecified atrial fibrillation: Secondary | ICD-10-CM | POA: Diagnosis not present

## 2017-03-19 DIAGNOSIS — F419 Anxiety disorder, unspecified: Secondary | ICD-10-CM | POA: Diagnosis not present

## 2017-03-19 DIAGNOSIS — Z955 Presence of coronary angioplasty implant and graft: Secondary | ICD-10-CM | POA: Insufficient documentation

## 2017-03-19 DIAGNOSIS — N320 Bladder-neck obstruction: Secondary | ICD-10-CM | POA: Diagnosis not present

## 2017-03-19 DIAGNOSIS — E119 Type 2 diabetes mellitus without complications: Secondary | ICD-10-CM | POA: Insufficient documentation

## 2017-03-19 DIAGNOSIS — I509 Heart failure, unspecified: Secondary | ICD-10-CM | POA: Diagnosis not present

## 2017-03-19 DIAGNOSIS — N138 Other obstructive and reflux uropathy: Secondary | ICD-10-CM

## 2017-03-19 DIAGNOSIS — I2510 Atherosclerotic heart disease of native coronary artery without angina pectoris: Secondary | ICD-10-CM | POA: Insufficient documentation

## 2017-03-19 DIAGNOSIS — I10 Essential (primary) hypertension: Secondary | ICD-10-CM | POA: Insufficient documentation

## 2017-03-19 DIAGNOSIS — Z950 Presence of cardiac pacemaker: Secondary | ICD-10-CM | POA: Diagnosis not present

## 2017-03-19 DIAGNOSIS — E039 Hypothyroidism, unspecified: Secondary | ICD-10-CM | POA: Diagnosis not present

## 2017-03-19 DIAGNOSIS — R39198 Other difficulties with micturition: Secondary | ICD-10-CM | POA: Diagnosis present

## 2017-03-19 DIAGNOSIS — E7800 Pure hypercholesterolemia, unspecified: Secondary | ICD-10-CM | POA: Insufficient documentation

## 2017-03-19 DIAGNOSIS — Z794 Long term (current) use of insulin: Secondary | ICD-10-CM | POA: Insufficient documentation

## 2017-03-19 DIAGNOSIS — N400 Enlarged prostate without lower urinary tract symptoms: Secondary | ICD-10-CM | POA: Diagnosis not present

## 2017-03-19 DIAGNOSIS — I110 Hypertensive heart disease with heart failure: Secondary | ICD-10-CM | POA: Diagnosis not present

## 2017-03-19 DIAGNOSIS — R3912 Poor urinary stream: Secondary | ICD-10-CM | POA: Diagnosis not present

## 2017-03-19 DIAGNOSIS — R351 Nocturia: Secondary | ICD-10-CM | POA: Diagnosis not present

## 2017-03-19 DIAGNOSIS — Z951 Presence of aortocoronary bypass graft: Secondary | ICD-10-CM | POA: Insufficient documentation

## 2017-03-19 DIAGNOSIS — R3916 Straining to void: Secondary | ICD-10-CM | POA: Insufficient documentation

## 2017-03-19 DIAGNOSIS — Z7901 Long term (current) use of anticoagulants: Secondary | ICD-10-CM | POA: Diagnosis not present

## 2017-03-19 HISTORY — PX: GREEN LIGHT LASER TURP (TRANSURETHRAL RESECTION OF PROSTATE: SHX6260

## 2017-03-19 LAB — GLUCOSE, CAPILLARY
Glucose-Capillary: 92 mg/dL (ref 65–99)
Glucose-Capillary: 124 mg/dL — ABNORMAL HIGH (ref 65–99)

## 2017-03-19 LAB — PROTIME-INR
INR: 1.34
Prothrombin Time: 16.5 seconds — ABNORMAL HIGH (ref 11.4–15.2)

## 2017-03-19 SURGERY — GREEN LIGHT LASER TURP (TRANSURETHRAL RESECTION OF PROSTATE
Anesthesia: General | Site: Prostate | Wound class: Clean Contaminated

## 2017-03-19 MED ORDER — OXYCODONE HCL 5 MG/5ML PO SOLN: 5.0000 mg | Freq: Once | ORAL | Status: AC | PRN

## 2017-03-19 MED ORDER — MIDAZOLAM HCL 2 MG/2ML IJ SOLN
INTRAMUSCULAR | Status: AC
  Filled 2017-03-19: qty 2

## 2017-03-19 MED ORDER — SODIUM CHLORIDE 0.9 % IV SOLN
INTRAVENOUS | Status: AC | PRN
  Administered 2017-03-19: 3000 mL via INTRAMUSCULAR

## 2017-03-19 MED ORDER — DOCUSATE SODIUM 100 MG PO CAPS: 200.0000 mg | ORAL_CAPSULE | Freq: Two times a day (BID) | ORAL | 3 refills | Status: DC

## 2017-03-19 MED ORDER — FENTANYL CITRATE (PF) 100 MCG/2ML IJ SOLN
INTRAMUSCULAR | Status: AC
  Filled 2017-03-19: qty 2

## 2017-03-19 MED ORDER — SEVOFLURANE IN SOLN
RESPIRATORY_TRACT | Status: AC
  Filled 2017-03-19: qty 250

## 2017-03-19 MED ORDER — ONDANSETRON 8 MG PO TBDP: 4.0000 mg | ORAL_TABLET | Freq: Four times a day (QID) | ORAL | 3 refills | Status: DC | PRN

## 2017-03-19 MED ORDER — FENTANYL CITRATE (PF) 100 MCG/2ML IJ SOLN
INTRAMUSCULAR | Status: AC
  Administered 2017-03-19: 25 ug via INTRAVENOUS
  Filled 2017-03-19: qty 2

## 2017-03-19 MED ORDER — NITROFURANTOIN MACROCRYSTAL 100 MG PO CAPS: 100.0000 mg | ORAL_CAPSULE | Freq: Two times a day (BID) | ORAL | 0 refills | Status: DC

## 2017-03-19 MED ORDER — SUGAMMADEX SODIUM 200 MG/2ML IV SOLN: INTRAVENOUS | Status: DC | PRN

## 2017-03-19 MED ORDER — FENTANYL CITRATE (PF) 100 MCG/2ML IJ SOLN
25.0000 ug | INTRAMUSCULAR | Status: DC | PRN
  Administered 2017-03-19 (×5): 25 ug via INTRAVENOUS

## 2017-03-19 MED ORDER — PROPOFOL 10 MG/ML IV BOLUS
INTRAVENOUS | Status: AC
  Filled 2017-03-19: qty 20

## 2017-03-19 MED ORDER — OXYCODONE HCL 5 MG PO TABS
5.0000 mg | ORAL_TABLET | Freq: Once | ORAL | Status: AC | PRN
  Administered 2017-03-19: 5 mg via ORAL

## 2017-03-19 MED ORDER — OXYCODONE HCL 5 MG PO TABS
ORAL_TABLET | ORAL | Status: AC
  Filled 2017-03-19: qty 1

## 2017-03-19 MED ORDER — CEFAZOLIN SODIUM-DEXTROSE 1-4 GM/50ML-% IV SOLN: 1.0000 g | Freq: Once | INTRAVENOUS | Status: DC

## 2017-03-19 MED ORDER — SUCCINYLCHOLINE CHLORIDE 20 MG/ML IJ SOLN
INTRAMUSCULAR | Status: DC | PRN
  Administered 2017-03-19: 100 mg via INTRAVENOUS

## 2017-03-19 MED ORDER — EPHEDRINE SULFATE 50 MG/ML IJ SOLN
INTRAMUSCULAR | Status: DC | PRN
  Administered 2017-03-19: 5 mg via INTRAVENOUS

## 2017-03-19 MED ORDER — PHENYLEPHRINE HCL 10 MG/ML IJ SOLN
INTRAMUSCULAR | Status: DC | PRN
  Administered 2017-03-19 (×2): 50 ug via INTRAVENOUS

## 2017-03-19 MED ORDER — PROPOFOL 10 MG/ML IV BOLUS
INTRAVENOUS | Status: DC | PRN
  Administered 2017-03-19: 90 mg via INTRAVENOUS

## 2017-03-19 MED ORDER — ONDANSETRON HCL 4 MG/2ML IJ SOLN
INTRAMUSCULAR | Status: DC | PRN
  Administered 2017-03-19: 4 mg via INTRAVENOUS

## 2017-03-19 MED ORDER — FENTANYL CITRATE (PF) 100 MCG/2ML IJ SOLN
INTRAMUSCULAR | Status: DC | PRN
  Administered 2017-03-19: 25 ug via INTRAVENOUS
  Administered 2017-03-19: 100 ug via INTRAVENOUS

## 2017-03-19 MED ORDER — SUGAMMADEX SODIUM 200 MG/2ML IV SOLN
INTRAVENOUS | Status: AC
  Filled 2017-03-19: qty 2

## 2017-03-19 MED ORDER — ROCURONIUM BROMIDE 100 MG/10ML IV SOLN
INTRAVENOUS | Status: DC | PRN
  Administered 2017-03-19: 15 mg via INTRAVENOUS
  Administered 2017-03-19 (×2): 10 mg via INTRAVENOUS
  Administered 2017-03-19: 20 mg via INTRAVENOUS
  Administered 2017-03-19: 10 mg via INTRAVENOUS
  Administered 2017-03-19: 5 mg via INTRAVENOUS

## 2017-03-19 MED ORDER — SODIUM CHLORIDE 0.9 % IV SOLN
INTRAVENOUS | Status: DC
  Administered 2017-03-19: 16:00:00 via INTRAVENOUS

## 2017-03-19 MED ORDER — ROCURONIUM BROMIDE 50 MG/5ML IV SOLN
INTRAVENOUS | Status: AC
  Filled 2017-03-19: qty 1

## 2017-03-19 MED ORDER — CEFAZOLIN SODIUM-DEXTROSE 1-4 GM/50ML-% IV SOLN
INTRAVENOUS | Status: AC
  Filled 2017-03-19: qty 50

## 2017-03-19 MED ORDER — LIDOCAINE HCL 2 % EX GEL: CUTANEOUS | Status: DC | PRN

## 2017-03-19 MED ORDER — DEXAMETHASONE SODIUM PHOSPHATE 10 MG/ML IJ SOLN
INTRAMUSCULAR | Status: AC
  Filled 2017-03-19: qty 1

## 2017-03-19 MED ORDER — ONDANSETRON HCL 4 MG/2ML IJ SOLN
INTRAMUSCULAR | Status: AC
  Filled 2017-03-19: qty 2

## 2017-03-19 MED ORDER — SUCCINYLCHOLINE CHLORIDE 20 MG/ML IJ SOLN
INTRAMUSCULAR | Status: AC
  Filled 2017-03-19: qty 1

## 2017-03-19 SURGICAL SUPPLY — 31 items
ADAPTER IRRIG TUBE 2 SPIKE SOL (ADAPTER) ×4 IMPLANT
BAG URINE DRAINAGE (UROLOGICAL SUPPLIES) ×2 IMPLANT
BAG URO DRAIN 2000ML W/SPOUT (MISCELLANEOUS) ×2 IMPLANT
CATH FOLEY 2WAY  5CC 20FR SIL (CATHETERS) ×1
CATH FOLEY 2WAY 5CC 20FR SIL (CATHETERS) ×1 IMPLANT
CATH FOLEY 2WAY SIL 22X30 (CATHETERS) ×2 IMPLANT
GLIDEWIRE STIFF .35X180X3 HYDR (WIRE) ×2 IMPLANT
GLOVE BIO SURGEON STRL SZ7 (GLOVE) ×4 IMPLANT
GLOVE BIO SURGEON STRL SZ7.5 (GLOVE) ×2 IMPLANT
GOWN STRL REUS W/ TWL LRG LVL3 (GOWN DISPOSABLE) ×1 IMPLANT
GOWN STRL REUS W/ TWL XL LVL3 (GOWN DISPOSABLE) ×1 IMPLANT
GOWN STRL REUS W/TWL LRG LVL3 (GOWN DISPOSABLE) ×1
GOWN STRL REUS W/TWL XL LVL3 (GOWN DISPOSABLE) ×1
GUIDEWIRE INTRO SET STRAIGHT (WIRE) ×2 IMPLANT
IV NS 1000ML (IV SOLUTION) ×1
IV NS 1000ML BAXH (IV SOLUTION) ×1 IMPLANT
IV SET PRIMARY 15D 139IN B9900 (IV SETS) ×2 IMPLANT
KIT RM TURNOVER CYSTO AR (KITS) ×2 IMPLANT
LASER GREENLIGHT XPS PROCEDURE (MISCELLANEOUS) ×2 IMPLANT
LASER GRNLGT MOXY FIBER 750UM (MISCELLANEOUS) ×2 IMPLANT
PACK CYSTO AR (MISCELLANEOUS) ×2 IMPLANT
SET IRRIG Y TYPE TUR BLADDER L (SET/KITS/TRAYS/PACK) ×2 IMPLANT
SOL .9 NS 3000ML IRR  AL (IV SOLUTION) ×13
SOL .9 NS 3000ML IRR UROMATIC (IV SOLUTION) ×13 IMPLANT
SOL PREP PVP 2OZ (MISCELLANEOUS) ×2
SOLUTION PREP PVP 2OZ (MISCELLANEOUS) ×1 IMPLANT
STENT URET 6FRX26 CONTOUR (STENTS) ×2 IMPLANT
SURGILUBE 2OZ TUBE FLIPTOP (MISCELLANEOUS) ×2 IMPLANT
SYRINGE IRR TOOMEY STRL 70CC (SYRINGE) ×4 IMPLANT
TUBING CONNECTING 10 (TUBING) ×2 IMPLANT
WATER STERILE IRR 1000ML POUR (IV SOLUTION) ×2 IMPLANT

## 2017-03-19 NOTE — Anesthesia Preprocedure Evaluation (Addendum)
Anesthesia Evaluation  Patient identified by MRN, date of birth, ID band Patient awake    Reviewed: Allergy & Precautions, H&P , NPO status , Patient's Chart, lab work & pertinent test results  History of Anesthesia Complications (+) Family history of anesthesia reaction and history of anesthetic complications  Airway Mallampati: III  TM Distance: <3 FB Neck ROM: limited    Dental  (+) Poor Dentition, Chipped, Caps   Pulmonary neg shortness of breath, former smoker,           Cardiovascular Exercise Tolerance: Good hypertension, (-) angina+ CAD, + Past MI and +CHF  (-) DOE + pacemaker + Cardiac Defibrillator      Neuro/Psych PSYCHIATRIC DISORDERS Anxiety  Neuromuscular disease    GI/Hepatic negative GI ROS, Neg liver ROS, neg GERD  ,  Endo/Other  diabetes, Type 2Hypothyroidism   Renal/GU Renal disease     Musculoskeletal   Abdominal   Peds  Hematology negative hematology ROS (+)   Anesthesia Other Findings Past Medical History: No date: A-fib ([place]) No date: AICD (automatic cardioverter/defibrillator) present No date: Allergic state No date: Anxiety No date: Arrhythmia     Comment:  atrial fib No date: BPH (benign prostatic hyperplasia) No date: CAD (coronary artery disease) No date: Cancer (HCC)     Comment:  skin of the nose and arm No date: CHF (congestive heart failure) (HCC) No date: Diabetes mellitus without complication (HCC) No date: Family history of adverse reaction to anesthesia     Comment:  daughter sleeps a long time No date: H/O diverticulitis of colon 1985, 1995,2000, 2004, : Heart attack ([place])     Comment:  multiple MI's No date: Heart disease No date: Heartburn No date: History of kidney stones No date: History of stomach ulcers No date: HLD (hyperlipidemia) No date: Hypertension No date: Hypothyroidism No date: Ischemic cardiomyopathy No date: Kidney stones No date: Neuropathy No  date: Pacemaker No date: Post herpetic neuralgia     Comment:  left side of face No date: Renal disorder     Comment:  UTI's No date: Rosacea No date: Testosterone deficiency No date: Tremors of nervous system No date: Trigeminal neuralgia of left side of face  Past Surgical History: No date: APPENDECTOMY No date: BACK SURGERY     Comment:  cervical diskectomy No date: CHOLECYSTECTOMY 01/27/2016: COLONOSCOPY WITH PROPOFOL; N/A     Comment:  Procedure: COLONOSCOPY WITH PROPOFOL;  Surgeon: Manya Silvas, MD;  Location: [facility] ENDOSCOPY;  Service:               Endoscopy;  Laterality: N/A; No date: CORONARY ANGIOPLASTY WITH STENT PLACEMENT     Comment:  multiple times No date: CORONARY ARTERY BYPASS GRAFT No date: eyelids 05/30/2016: IMPLANTABLE CARDIOVERTER DEFIBRILLATOR (ICD) GENERATOR  CHANGE; Left     Comment:  Procedure: ICD GENERATOR CHANGE;  Surgeon: Isaias Cowman, MD;  Location: [facility] ORS;  Service:               Cardiovascular;  Laterality: Left; No date: kidney stones No date: NASAL SEPTUM SURGERY No date: ostate surgery No date: PACEMAKER INSERTION No date: prostate heating     Comment:  seven years ago 11/28/2015: TEE WITHOUT CARDIOVERSION; N/A     Comment:  Procedure: Transesophageal Echocardiogram (Tee);  Surgeon: Teodoro Spray, MD;  Location: [facility] ORS;                Service: Cardiovascular;  Laterality: N/A; No date: THYROID SURGERY No date: TONSILLECTOMY     Comment:  x 2     Reproductive/Obstetrics negative OB ROS                             Anesthesia Physical Anesthesia Plan  ASA: IV  Anesthesia Plan: General ETT   Post-op Pain Management:    Induction: Intravenous  PONV Risk Score and Plan: 2 and Ondansetron, Dexamethasone and Treatment may vary due to age or medical condition  Airway Management Planned: Oral ETT  Additional Equipment:   Intra-op Plan:    Post-operative Plan: Extubation in OR  Informed Consent: I have reviewed the patients History and Physical, chart, labs and discussed the procedure including the risks, benefits and alternatives for the proposed anesthesia with the patient or authorized representative who has indicated his/her understanding and acceptance.   Dental Advisory Given  Plan Discussed with: Anesthesiologist, CRNA and Surgeon  Anesthesia Plan Comments: (Procedure to take place bellow the patients umbilicus so plan to not modify his pacemaker ICD  Patient informed that they are higher risk for complications from anesthesia during this procedure due to their medical history.  Patient voiced understanding.  Patient consented for risks of anesthesia including but not limited to:  - adverse reactions to medications - damage to teeth, lips or other oral mucosa - sore throat or hoarseness - Damage to heart, brain, lungs or loss of life  Patient voiced understanding.)       Anesthesia Quick Evaluation

## 2017-03-19 NOTE — Op Note (Signed)
Preoperative diagnosis: BPH with bladder outlet obstruction  Postoperative diagnosis: Same   Procedure: 1. Photo vaporization of the prostate with greenlight laser                      2. Left double pigtail ureteral stent placement    Surgeon: Otelia Limes. Yves Dill MD  Anesthesia: General  Indications:See the history and physical. After informed consent the above procedure(s) were requested     Technique and findings: After adequate general anesthesia been obtained the patient was placed into dorsal lithotomy position and the perineum was prepped and draped in the usual fashion. The Laserscope was coupled to the camera and visually advanced into the bladder. Bladder was heavily trabeculated. The patient had trilobar BPH with intravesical growth of the median lobe. The right ureteral orifice was localized within a diverticulum. The left ureteral orifice was located beneath the median lobe. The greenlight XPS laser fiber was introduced through the scope and set at 92 W. Bladder neck and median lobe tissue was vaporized. Power was increased to 120 W and remaining tissue from the bladder neck to the verumontanum was vaporized. The power was increased 180 W and remaining obstructive tissue vaporized. After complete vaporization reexamination of the ureters revealed that the left orifice was very close to the margin resection and it was possible laser scatter in this region. Therefore a 0.035 Glidewire was passed up the left ureter and a 6 x 26 and her double pigtail ureteral stent advanced over the guidewire and position in the ureter under fluoroscopic guidance. The retrieval suture was left attached. The right  renal orifice was sufficiently distant from the margin resection inside the diverticulum. At this point the bladder was drained and scope removed. A 22 French silicone catheter was placed and irrigated until clear. The retrieval suture was tied to the Foley catheter. Blood loss was approximately 100  cc. The procedure was then terminated and patient transferred to the recovery room in stable condition.

## 2017-03-19 NOTE — Anesthesia Procedure Notes (Signed)
Procedure Name: Intubation Date/Time: 03/19/2017 4:49 PM Performed by: Dionne Bucy Pre-anesthesia Checklist: Patient identified, Patient being monitored, Timeout performed, Emergency Drugs available and Suction available Patient Re-evaluated:Patient Re-evaluated prior to induction Oxygen Delivery Method: Circle system utilized Preoxygenation: Pre-oxygenation with 100% oxygen Induction Type: IV induction Ventilation: Mask ventilation without difficulty Laryngoscope Size: Mac and 4 Grade View: Grade I Tube type: Oral Tube size: 7.5 mm Number of attempts: 1 Airway Equipment and Method: Stylet Placement Confirmation: ETT inserted through vocal cords under direct vision,  positive ETCO2 and breath sounds checked- equal and bilateral Secured at: 23 cm Tube secured with: Tape Dental Injury: Teeth and Oropharynx as per pre-operative assessment

## 2017-03-19 NOTE — Discharge Instructions (Addendum)
Benign Prostatic Hyperplasia Benign prostatic hyperplasia is when the prostate gland is bigger than normal (enlarged). The prostate is a gland that produces the fluid that goes into semen. It is near the opening to the bladder and it surrounds the tube that drains urine out of the body (urethra). Benign prostatic hyperplasia is common among older men and it typically causes problems with urinating. The prostate grows slowly as you age. As the prostate grows, it can pinch the urethra. This causes the bladder to work too hard to pass urine, which leads to a thickened bladder wall. The bladder may eventually become weak and unable to empty completely. What are the causes? The exact cause of this condition is not known. It may be related to changes in hormones as the body ages. What increases the risk? You are more likely to develop this condition if:  You have a family history of the condition.  You are age 81 or older.  You have a history of erectile dysfunction.  You do not exercise.  You have certain medical conditions, including: ? Type 2 diabetes. ? Obesity. ? Heart and circulatory disease.  What are the signs or symptoms? Symptoms of this condition include:  Weak or interrupted urine stream.  Dribbling or leaking urine.  Feeling like the bladder has not emptied completely.  Difficulty starting urination.  Getting up frequently at night to urinate.  Urinating more often (8 or more times a day).  Accidental loss of urine (urinary incontinence).  Pain during urination or ejaculation.  Urine with an unusual smell or color.  The size of the prostate does not always determine the severity of the symptoms. For example, a man with a large prostate may experience minor symptoms, or a man with a smaller prostate may experience a severe blockage. How is this diagnosed? This condition may be diagnosed based on:  Your medical history and symptoms.  A physical exam. This usually  includes a digital rectal exam. During this exam, your health care provider places a gloved, lubricated finger into the rectum to feel the size of the prostate.  A blood test. This test checks for high levels of a protein that is produced by the prostate (prostate specific antigen, PSA).  Tests to examine how well the urethra and bladder are functioning (urodynamic tests).  Cystoscopy. For this test, a small, tube-shaped instrument (cystoscope) is used to look inside the urethra and bladder. The cystoscope is placed into the urinary tract through the opening at the tip of the penis.  Urine tests.  Ultrasound.  How is this treated? Treatment for this condition depends on how severe your symptoms are. Treatment may include:  Active surveillance or "watchful waiting." If your symptoms are mild, your health care provider may delay treatment and ask you to keep track of your symptoms. You will have regular checkups to examine the size of your prostate, discuss symptoms, and determine whether treatment is needed.  Medicines. These may be used to: ? Stop prostate growth. ? Shrink the prostate. ? Relieve symptoms.  Lifestyle changes, including: ? Pelvic floor muscle exercises. The pelvic floor muscles are a group of muscles that relax when you urinate. ? Bladder training. This involves exercises that train the bladder to hold more urine for longer periods. ? Reducing the amount of liquid that you drink. This is especially important before sleeping and before long periods of time spent in public. ? Reducing the amount of caffeine and alcohol that you drink. ? Treating or  preventing constipation.  Surgery to reduce the size of the prostate or widen the urethra. This is typically done if your symptoms are severe or there are serious complications from the enlarged prostate.  Follow these instructions at home: Medicines  Take over-the-counter and prescription medicines as told by your health  care provider.  Avoid certain medicines, such as decongestants, antihistamines, and some prescription medicines as told by your health care provider. Ask your health care provider which medicines you should avoid. General instructions  Monitor your symptoms for any changes. Tell your health care provider about any changes.  Give yourself time when you urinate.  Avoid certain beverages that can irritate the bladder, such as: ? Alcohol. ? Caffeinated drinks like coffee, tea, and cola.  Avoid drinking large amounts of liquid before bed or before going out in public.  Do pelvic floor muscle or bladder training exercises as told by your health care provider.  Keep all follow-up visits as told by your health care provider. This is important. Contact a health care provider if:  Your develop new or worse symptoms.  You have trouble getting or maintaining an erection.  You have a fever.  You have pain or burning during urination.  You have blood in your urine. Get help right away if:  You have severe pain when urinating.  You cannot urinate.  You have severe pain in your abdomen.  You are dizzy.  You faint.  You have severe back pain.  Your urine is dark red and difficult to see through.  You have large blood clots in your urine.  You have severe pain after an erection.  You have chest pain, dizziness, or nausea during sexual activity. Summary  The prostate is a gland that produces the fluid that goes into semen. It is near the opening to the bladder and it surrounds the tube that drains urine out of the body (urethra).  Benign prostatic hyperplasia is common among older men and it typically causes problems with urinating.  If your symptoms are mild, your health care provider may delay treatment and ask you to keep track of your symptoms. You will have regular checkups to examine the size of your prostate, discuss symptoms, and determine whether treatment is  needed.  If directed, you may need to avoid certain medicines, such as decongestants, antihistamines, and some prescription medicines.  Contact your health care provider if you develop new or worse symptoms. This information is not intended to replace advice given to you by your health care provider. Make sure you discuss any questions you have with your health care provider. Document Released: 07/02/2005 Document Revised: 05/21/2016 Document Reviewed: 05/21/2016 Elsevier Interactive Patient Education  2017 [employer] Light Laser Prostate Treatment Green light laser therapy is a procedure that uses a high-energy laser to get rid of extra prostate tissue by turning the tissue into a vapor. It is less invasive than traditional methods of prostate surgery, which involve cutting out the prostate tissue. Because the tissue is turned into a vapor (vaporized) rather than cut out, there is generally less blood loss. This surgery is used to treat an enlarged prostate gland (benign prostatic hyperplasia). Tell a health care provider about:  Any allergies you have.  All medicines you are taking, including vitamins, herbs, eye drops, creams, and over-the-counter medicines.  Any problems you or family members have had with anesthetic medicines.  Any blood disorders you have.  Any surgeries you have had.  Any medical conditions  you have. What are the risks? Generally, this is a safe procedure. However, problems may occur, including:  Infection.  Bleeding.  Allergic reaction to medicines.  Damage to other structures or organs.  Blood in the urine (hematuria).  Painful urination.  Urinary tract infection.  Erectile dysfunction (rare).  Dry ejaculation.  Scar tissue in the urinary passage.  What happens before the procedure? Staying hydrated Follow instructions from your health care provider about hydration, which may include:  Up to 2 hours before the procedure - you  may continue to drink clear liquids, such as water, clear fruit juice, black coffee, and plain tea.  Eating and drinking restrictions Follow instructions from your health care provider about eating and drinking, which may include:  8 hours before the procedure - stop eating heavy meals or foods such as meat, fried foods, or fatty foods.  6 hours before the procedure - stop eating light meals or foods, such as toast or cereal.  6 hours before the procedure - stop drinking milk or drinks that contain milk.  2 hours before the procedure - stop drinking clear liquids.  Medicines  Ask your health care provider about: ? Changing or stopping your regular medicines. This is especially important if you are taking diabetes medicines or blood thinners. ? Taking medicines such as aspirin and ibuprofen. These medicines can thin your blood. Do not take these medicines before your procedure if your health care provider instructs you not to.  You may be prescribed antibiotic medicine. If so, take your antibiotic as told by your health care provider. Do not stop taking the antibiotic even if you start to feel better. General instructions  Plan to have someone take you home from the hospital or clinic.  If you will be going home right after the procedure, plan to have someone with you for 24 hours. What happens during the procedure?  To reduce your risk of infection: ? Your health care team will wash or sanitize their hands. ? Your skin will be washed with soap.  You will be given one or more of the following: ? A medicine to help you relax (sedative). ? A medicine to make you fall asleep (general anesthetic). ? A medicine that is injected into your spine to numb the area below and slightly above the injection site (spinal anesthetic).  A tube containing viewing scopes and instruments (fiber-optic scope) will be inserted through your penis.  A thin fiber will be put through the tube and  positioned next to the extra prostate tissue.  Pulses of laser light will come from the end of the fiber and be projected onto the extra tissue. Your blood will absorb the light, become hot, and vaporize the extra prostate tissue.  The heat from the laser beam will seal off the blood vessels, which will lessen bleeding.  The fiber-optic scope will be removed and replaced with a temporary tube (catheter) that is used to help urine flow. The procedure may vary among health care providers and hospitals. What happens after the procedure?  Your blood pressure, heart rate, breathing rate, and blood oxygen level will be monitored until the medicines you were given have worn off.  Depending on factors such as the amount of prostate tissue that was vaporized, the strength of your bladder, and the amount of bleeding expected, your catheter may be removed.  You may be given elastic support stockings to wear to help prevent blood clots in your legs.  Do not drive for  24 hours if you were given a sedative, or for as long as directed by your health care provider. Summary  Green light laser therapy is a procedure that uses a high-energy laser that turns extra prostate tissue into a vapor.  This procedure is less invasive than traditional methods of prostate surgery.  Follow instructions from your health care provider about eating and drinking before the procedure.  Pulses of laser light will come from the end of a thin fiber and be aimed at the extra prostate tissue. Your blood will absorb the light, become hot, and vaporize the extra tissue. This information is not intended to replace advice given to you by your health care provider. Make sure you discuss any questions you have with your health care provider. Document Released: 10/09/2007 Document Revised: 07/21/2016 Document Reviewed: 07/21/2016 Elsevier Interactive Patient Education  2017 [employer]   Transurethral Vaporization of Prostate,  Care After This sheet gives you information about how to care for yourself after your procedure. Your health care provider may also give you more specific instructions. If you have problems or questions, contact your health care provider. What can I expect after the procedure? After the procedure, it is common to have:  Pain or discomfort around your penis.  Blood in your urine.  Burning during urination.  Sudden urges to urinate.  Little or no semen produced during ejaculation (retrograde ejaculation).  Follow these instructions at home: Medicines  Take over-the-counter and prescription medicines only as told by your health care provider.  If you were prescribed an antibiotic medicine, take it as told by your health care provider. Do not stop taking the antibiotic even if you start to feel better. Activity  Do not drive for 24 hours if you were given a medicine to help you relax (sedative) during your procedure.  Do not drive or use heavy machinery while taking prescription pain medicine.  Do not resume sexual activity until your health care provider approves. Ask your health care provider: ? What activities are safe for you. ? When you may return to your normal activities.  Do not lift anything that is heavier than 10 lb (4.5 kg), or the limit that you are told, until your health care provider says that it is safe. General instructions  Drink enough fluid to keep your urine clear or pale yellow.  To prevent or treat constipation while you are taking prescription pain medicine, your health care provider may recommend that you: ? Take over-the-counter or prescription medicines. ? Eat foods that are high in fiber, such as fresh fruits and vegetables, whole grains, and beans. ? Limit foods that are high in fat and processed sugars, such as fried and sweet foods.  If you go home with a tube draining your urine (urinary catheter), care for your catheter as told by your health  care provider. This may include: ? Washing your hands before and after you touch the catheter. ? Keeping the area around the catheter clean and dry. ? Emptying the catheter bag when it is full and monitoring the amount and color of your urine. ? Avoiding bending or breaking the catheter. ? Keeping air out of the catheter. ? Not putting the catheter underwater. ? Visiting your health care provider to have the catheter removed.  Keep all follow-up visits as told by your health care provider. This is important. Contact a health care provider if:  You have: ? A fever or chills. ? Trouble urinating or controlling when you urinate. ?  More blood in your urine instead of less. ? Problems getting an erection.  You start to pass blood clots in your urine.  You have nausea or you vomit.  There is more redness, swelling, or pain in your penis. Get help right away if:  You have bright red urine.  You cannot urinate.  You have severe pain that does not get better with medicine.  You have shortness of breath. Summary  After this procedure, it is common to have some blood in your urine. If you see bright red blood in your urine, however, you should get medical help right away.  This procedure may cause you to produce little or no semen when ejaculating (retrograde ejaculation).  Follow restrictions about lifting and sexual activity as told by your health care provider. Ask what activities are safe for you.  Drink enough fluid to keep your urine clear or pale yellow. This information is not intended to replace advice given to you by your health care provider. Make sure you discuss any questions you have with your health care provider. Document Released: 09/12/2016 Document Revised: 09/12/2016 Document Reviewed: 09/12/2016 Elsevier Interactive Patient Education  2018 [place].  Indwelling Urinary Catheter Care, Adult Take good care of your catheter to keep it working and to prevent  problems. How to wear your catheter Attach your catheter to your leg with tape (adhesive tape) or a leg strap. Make sure it is not too tight. If you use tape, remove any bits of tape that are already on the catheter. How to wear a drainage bag You should have:  A large overnight bag.  A small leg bag.  Overnight Bag You may wear the overnight bag at any time. Always keep the bag below the level of your bladder but off the floor. When you sleep, put a clean plastic bag in a wastebasket. Then hang the bag inside the wastebasket. Leg Bag Never wear the leg bag at night. Always wear the leg bag below your knee. Keep the leg bag secure with a leg strap or tape. How to care for your skin  Clean the skin around the catheter at least once every day.  Shower every day. Do not take baths.  Put creams, lotions, or ointments on your genital area only as told by your doctor.  Do not use powders, sprays, or lotions on your genital area. How to clean your catheter and your skin 1. Wash your hands with soap and water. 2. Wet a washcloth in warm water and gentle (mild) soap. 3. Use the washcloth to clean the skin where the catheter enters your body. Clean downward and wipe away from the catheter in small circles. Do not wipe toward the catheter. 4. Pat the area dry with a clean towel. Make sure to clean off all soap. How to care for your drainage bags Empty your drainage bag when it is ?- full or at least 2-3 times a day. Replace your drainage bag once a month or sooner if it starts to smell bad or look dirty. Do not clean your drainage bag unless told by your doctor. Emptying a drainage bag  Supplies Needed  Rubbing alcohol.  Gauze pad or cotton ball.  Tape or a leg strap.  Steps 1. Wash your hands with soap and water. 2. Separate (detach) the bag from your leg. 3. Hold the bag over the toilet or a clean container. Keep the bag below your hips and bladder. This stops pee (urine) from  going back into the tube. 4. Open the pour spout at the bottom of the bag. 5. Empty the pee into the toilet or container. Do not let the pour spout touch any surface. 6. Put rubbing alcohol on a gauze pad or cotton ball. 7. Use the gauze pad or cotton ball to clean the pour spout. 8. Close the pour spout. 9. Attach the bag to your leg with tape or a leg strap. 10. Wash your hands.  Changing a drainage bag Supplies Needed  Alcohol wipes.  A clean drainage bag.  Adhesive tape or a leg strap.  Steps 1. Wash your hands with soap and water. 2. Separate the dirty bag from your leg. 3. Pinch the rubber catheter with your fingers so that pee does not spill out. 4. Separate the catheter tube from the drainage tube where these tubes connect (at the connection valve). Do not let the tubes touch any surface. 5. Clean the end of the catheter tube with an alcohol wipe. Use a different alcohol wipe to clean the end of the drainage tube. 6. Connect the catheter tube to the drainage tube of the clean bag. 7. Attach the new bag to the leg with adhesive tape or a leg strap. 8. Wash your hands.  How to prevent infection and other problems  Never pull on your catheter or try to remove it. Pulling can damage tissue in your body.  Always wash your hands before and after touching your catheter.  If a leg strap gets wet, replace it with a dry one.  Drink enough fluids to keep your pee clear or pale yellow, or as told by your doctor.  Do not let the drainage bag or tubing touch the floor.  Wear cotton underwear.  If you are male, wipe from front to back after you poop (have a bowel movement).  Check on the catheter often to make sure it works and the tubing is not twisted. Get help if:  Your pee is cloudy.  Your pee smells unusually bad.  Your pee is not draining into the bag.  Your tube gets clogged.  Your catheter starts to leak.  Your bladder feels full. Get help right away  if:  You have redness, swelling, or pain where the catheter enters your body.  You have fluid, pus, or a bad smell coming from the area where the catheter enters your body.  The area where the catheter enters your body feels warm.  You have a fever.  You have pain in your: ? Stomach (abdomen). ? Legs. ? Lower back. ? Bladder.  You see blood fill the catheter.  Your pee is pink or red.  You feel sick to your stomach (nauseous).  You throw up (vomit).  You have chills.  Your catheter gets pulled out. This information is not intended to replace advice given to you by your health care provider. Make sure you discuss any questions you have with your health care provider. Document Released: 10/27/2012 Document Revised: 05/30/2016 Document Reviewed: 12/15/2013 Elsevier Interactive Patient Education  2018 [place]   1) The drugs that you were given will stay in your system until tomorrow so for the next 24 hours you should not:  A) Drive an automobile B) Make any legal decisions C) Drink any alcoholic beverage   2) You may resume regular meals tomorrow.  Today it is better to start with liquids and gradually work up to solid foods.  You may eat anything you prefer, but it is better to start with liquids, then soup and crackers, and gradually work up to solid foods.   3) Please notify your doctor immediately if you have any unusual bleeding, trouble breathing, redness and pain at the surgery site, drainage, fever, or pain not relieved by medication.    4) Additional Instructions:        Please contact your physician with any problems or Same Day Surgery at [phone], Monday through Friday 6 am to 4 pm, or [place] at [facility] number at [phone].

## 2017-03-19 NOTE — Anesthesia Post-op Follow-up Note (Signed)
Anesthesia QCDR form completed.        

## 2017-03-19 NOTE — Transfer of Care (Signed)
Immediate Anesthesia Transfer of Care Note  Patient: Dakota Thomas  Procedure(s) Performed: Procedure(s): GREEN LIGHT LASER TURP (TRANSURETHRAL RESECTION OF PROSTATE (N/A)  Patient Location: PACU  Anesthesia Type:General  Level of Consciousness: awake and patient cooperative  Airway & Oxygen Therapy: Patient Spontanous Breathing  Post-op Assessment: Report given to RN  Post vital signs: Reviewed and stable  Last Vitals:  Vitals:   03/19/17 1505  BP: 132/82  Pulse: 65  Resp: 16  Temp: (!) 35.4 C  SpO2: 97%    Last Pain:  Vitals:   03/19/17 1505  TempSrc: Tympanic         Complications: No apparent anesthesia complications

## 2017-03-19 NOTE — H&P (Signed)
Date of Initial H&P: 03/11/17  History reviewed, patient examined, no change in status, stable for surgery.

## 2017-03-20 ENCOUNTER — Encounter: Payer: Self-pay | Admitting: Urology

## 2017-03-20 NOTE — Anesthesia Postprocedure Evaluation (Signed)
Anesthesia Post Note  Patient: Dakota Thomas  Procedure(s) Performed: Procedure(s) (LRB): GREEN LIGHT LASER TURP (TRANSURETHRAL RESECTION OF PROSTATE (N/A)  Patient location during evaluation: PACU Anesthesia Type: General Level of consciousness: awake and alert Pain management: pain level controlled Vital Signs Assessment: post-procedure vital signs reviewed and stable Respiratory status: spontaneous breathing, nonlabored ventilation, respiratory function stable and patient connected to nasal cannula oxygen Cardiovascular status: blood pressure returned to baseline and stable Postop Assessment: no signs of nausea or vomiting Anesthetic complications: no     Last Vitals:  Vitals:   03/19/17 2023 03/19/17 2120  BP: (!) 148/72 111/66  Pulse: 74 80  Resp: 11 18  Temp: 36.7 C   SpO2: 98% 96%    Last Pain:  Vitals:   03/19/17 2120  TempSrc:   PainSc: 2                  [name]

## 2017-03-26 DIAGNOSIS — Z7901 Long term (current) use of anticoagulants: Secondary | ICD-10-CM | POA: Diagnosis not present

## 2017-03-26 DIAGNOSIS — I480 Paroxysmal atrial fibrillation: Secondary | ICD-10-CM | POA: Diagnosis not present

## 2017-04-03 DIAGNOSIS — N393 Stress incontinence (female) (male): Secondary | ICD-10-CM | POA: Diagnosis not present

## 2017-04-03 DIAGNOSIS — N401 Enlarged prostate with lower urinary tract symptoms: Secondary | ICD-10-CM | POA: Diagnosis not present

## 2017-04-04 DIAGNOSIS — Z7901 Long term (current) use of anticoagulants: Secondary | ICD-10-CM | POA: Diagnosis not present

## 2017-04-09 DIAGNOSIS — R32 Unspecified urinary incontinence: Secondary | ICD-10-CM | POA: Diagnosis not present

## 2017-04-17 DIAGNOSIS — Z7901 Long term (current) use of anticoagulants: Secondary | ICD-10-CM | POA: Diagnosis not present

## 2017-04-17 DIAGNOSIS — Z7982 Long term (current) use of aspirin: Secondary | ICD-10-CM | POA: Diagnosis not present

## 2017-04-23 DIAGNOSIS — Z0000 Encounter for general adult medical examination without abnormal findings: Secondary | ICD-10-CM | POA: Diagnosis not present

## 2017-04-23 DIAGNOSIS — E039 Hypothyroidism, unspecified: Secondary | ICD-10-CM | POA: Diagnosis not present

## 2017-04-23 DIAGNOSIS — R8290 Unspecified abnormal findings in urine: Secondary | ICD-10-CM | POA: Diagnosis not present

## 2017-04-23 DIAGNOSIS — I340 Nonrheumatic mitral (valve) insufficiency: Secondary | ICD-10-CM | POA: Diagnosis not present

## 2017-04-23 DIAGNOSIS — Z7901 Long term (current) use of anticoagulants: Secondary | ICD-10-CM | POA: Diagnosis not present

## 2017-04-23 DIAGNOSIS — E119 Type 2 diabetes mellitus without complications: Secondary | ICD-10-CM | POA: Diagnosis not present

## 2017-04-23 DIAGNOSIS — I25810 Atherosclerosis of coronary artery bypass graft(s) without angina pectoris: Secondary | ICD-10-CM | POA: Diagnosis not present

## 2017-04-23 DIAGNOSIS — Z794 Long term (current) use of insulin: Secondary | ICD-10-CM | POA: Diagnosis not present

## 2017-04-23 DIAGNOSIS — I255 Ischemic cardiomyopathy: Secondary | ICD-10-CM | POA: Diagnosis not present

## 2017-04-23 DIAGNOSIS — L719 Rosacea, unspecified: Secondary | ICD-10-CM | POA: Diagnosis not present

## 2017-05-08 DIAGNOSIS — I255 Ischemic cardiomyopathy: Secondary | ICD-10-CM | POA: Diagnosis not present

## 2017-05-08 DIAGNOSIS — I10 Essential (primary) hypertension: Secondary | ICD-10-CM | POA: Diagnosis not present

## 2017-05-08 DIAGNOSIS — Z7901 Long term (current) use of anticoagulants: Secondary | ICD-10-CM | POA: Diagnosis not present

## 2017-05-08 DIAGNOSIS — D62 Acute posthemorrhagic anemia: Secondary | ICD-10-CM | POA: Diagnosis not present

## 2017-05-08 DIAGNOSIS — Z794 Long term (current) use of insulin: Secondary | ICD-10-CM | POA: Diagnosis not present

## 2017-05-08 DIAGNOSIS — R319 Hematuria, unspecified: Secondary | ICD-10-CM | POA: Diagnosis not present

## 2017-05-08 DIAGNOSIS — I25810 Atherosclerosis of coronary artery bypass graft(s) without angina pectoris: Secondary | ICD-10-CM | POA: Diagnosis not present

## 2017-05-08 DIAGNOSIS — I480 Paroxysmal atrial fibrillation: Secondary | ICD-10-CM | POA: Diagnosis not present

## 2017-05-08 DIAGNOSIS — N390 Urinary tract infection, site not specified: Secondary | ICD-10-CM | POA: Diagnosis not present

## 2017-05-08 DIAGNOSIS — E1140 Type 2 diabetes mellitus with diabetic neuropathy, unspecified: Secondary | ICD-10-CM | POA: Diagnosis not present

## 2017-05-22 DIAGNOSIS — Z7901 Long term (current) use of anticoagulants: Secondary | ICD-10-CM | POA: Diagnosis not present

## 2017-06-05 DIAGNOSIS — R791 Abnormal coagulation profile: Secondary | ICD-10-CM | POA: Diagnosis not present

## 2017-06-12 DIAGNOSIS — G629 Polyneuropathy, unspecified: Secondary | ICD-10-CM | POA: Diagnosis not present

## 2017-06-12 DIAGNOSIS — R209 Unspecified disturbances of skin sensation: Secondary | ICD-10-CM | POA: Diagnosis not present

## 2017-06-12 DIAGNOSIS — E1142 Type 2 diabetes mellitus with diabetic polyneuropathy: Secondary | ICD-10-CM | POA: Diagnosis not present

## 2017-06-13 DIAGNOSIS — Z1283 Encounter for screening for malignant neoplasm of skin: Secondary | ICD-10-CM | POA: Diagnosis not present

## 2017-06-13 DIAGNOSIS — D1801 Hemangioma of skin and subcutaneous tissue: Secondary | ICD-10-CM | POA: Diagnosis not present

## 2017-06-13 DIAGNOSIS — Z85828 Personal history of other malignant neoplasm of skin: Secondary | ICD-10-CM | POA: Diagnosis not present

## 2017-06-13 DIAGNOSIS — L905 Scar conditions and fibrosis of skin: Secondary | ICD-10-CM | POA: Diagnosis not present

## 2017-06-13 DIAGNOSIS — L720 Epidermal cyst: Secondary | ICD-10-CM | POA: Diagnosis not present

## 2017-06-13 DIAGNOSIS — L820 Inflamed seborrheic keratosis: Secondary | ICD-10-CM | POA: Diagnosis not present

## 2017-06-13 DIAGNOSIS — L739 Follicular disorder, unspecified: Secondary | ICD-10-CM | POA: Diagnosis not present

## 2017-06-13 DIAGNOSIS — L821 Other seborrheic keratosis: Secondary | ICD-10-CM | POA: Diagnosis not present

## 2017-06-13 DIAGNOSIS — L570 Actinic keratosis: Secondary | ICD-10-CM | POA: Diagnosis not present

## 2017-06-19 DIAGNOSIS — M5136 Other intervertebral disc degeneration, lumbar region: Secondary | ICD-10-CM | POA: Diagnosis not present

## 2017-06-19 DIAGNOSIS — M4726 Other spondylosis with radiculopathy, lumbar region: Secondary | ICD-10-CM | POA: Diagnosis not present

## 2017-06-19 DIAGNOSIS — M5416 Radiculopathy, lumbar region: Secondary | ICD-10-CM | POA: Diagnosis not present

## 2017-06-19 DIAGNOSIS — R791 Abnormal coagulation profile: Secondary | ICD-10-CM | POA: Diagnosis not present

## 2017-06-25 DIAGNOSIS — N401 Enlarged prostate with lower urinary tract symptoms: Secondary | ICD-10-CM | POA: Diagnosis not present

## 2017-06-25 DIAGNOSIS — N3941 Urge incontinence: Secondary | ICD-10-CM | POA: Diagnosis not present

## 2017-07-02 DIAGNOSIS — R791 Abnormal coagulation profile: Secondary | ICD-10-CM | POA: Diagnosis not present

## 2017-07-07 IMAGING — CR DG CHEST 2V
2 series · 2 of 2 positions shown · non-contrast
Comparison: 07/02/2015 and 06/21/2015.

CLINICAL DATA: Cough with leukocytosis. History of diabetes and
hypertension.

EXAM:
CHEST  2 VIEW

[chest pa]
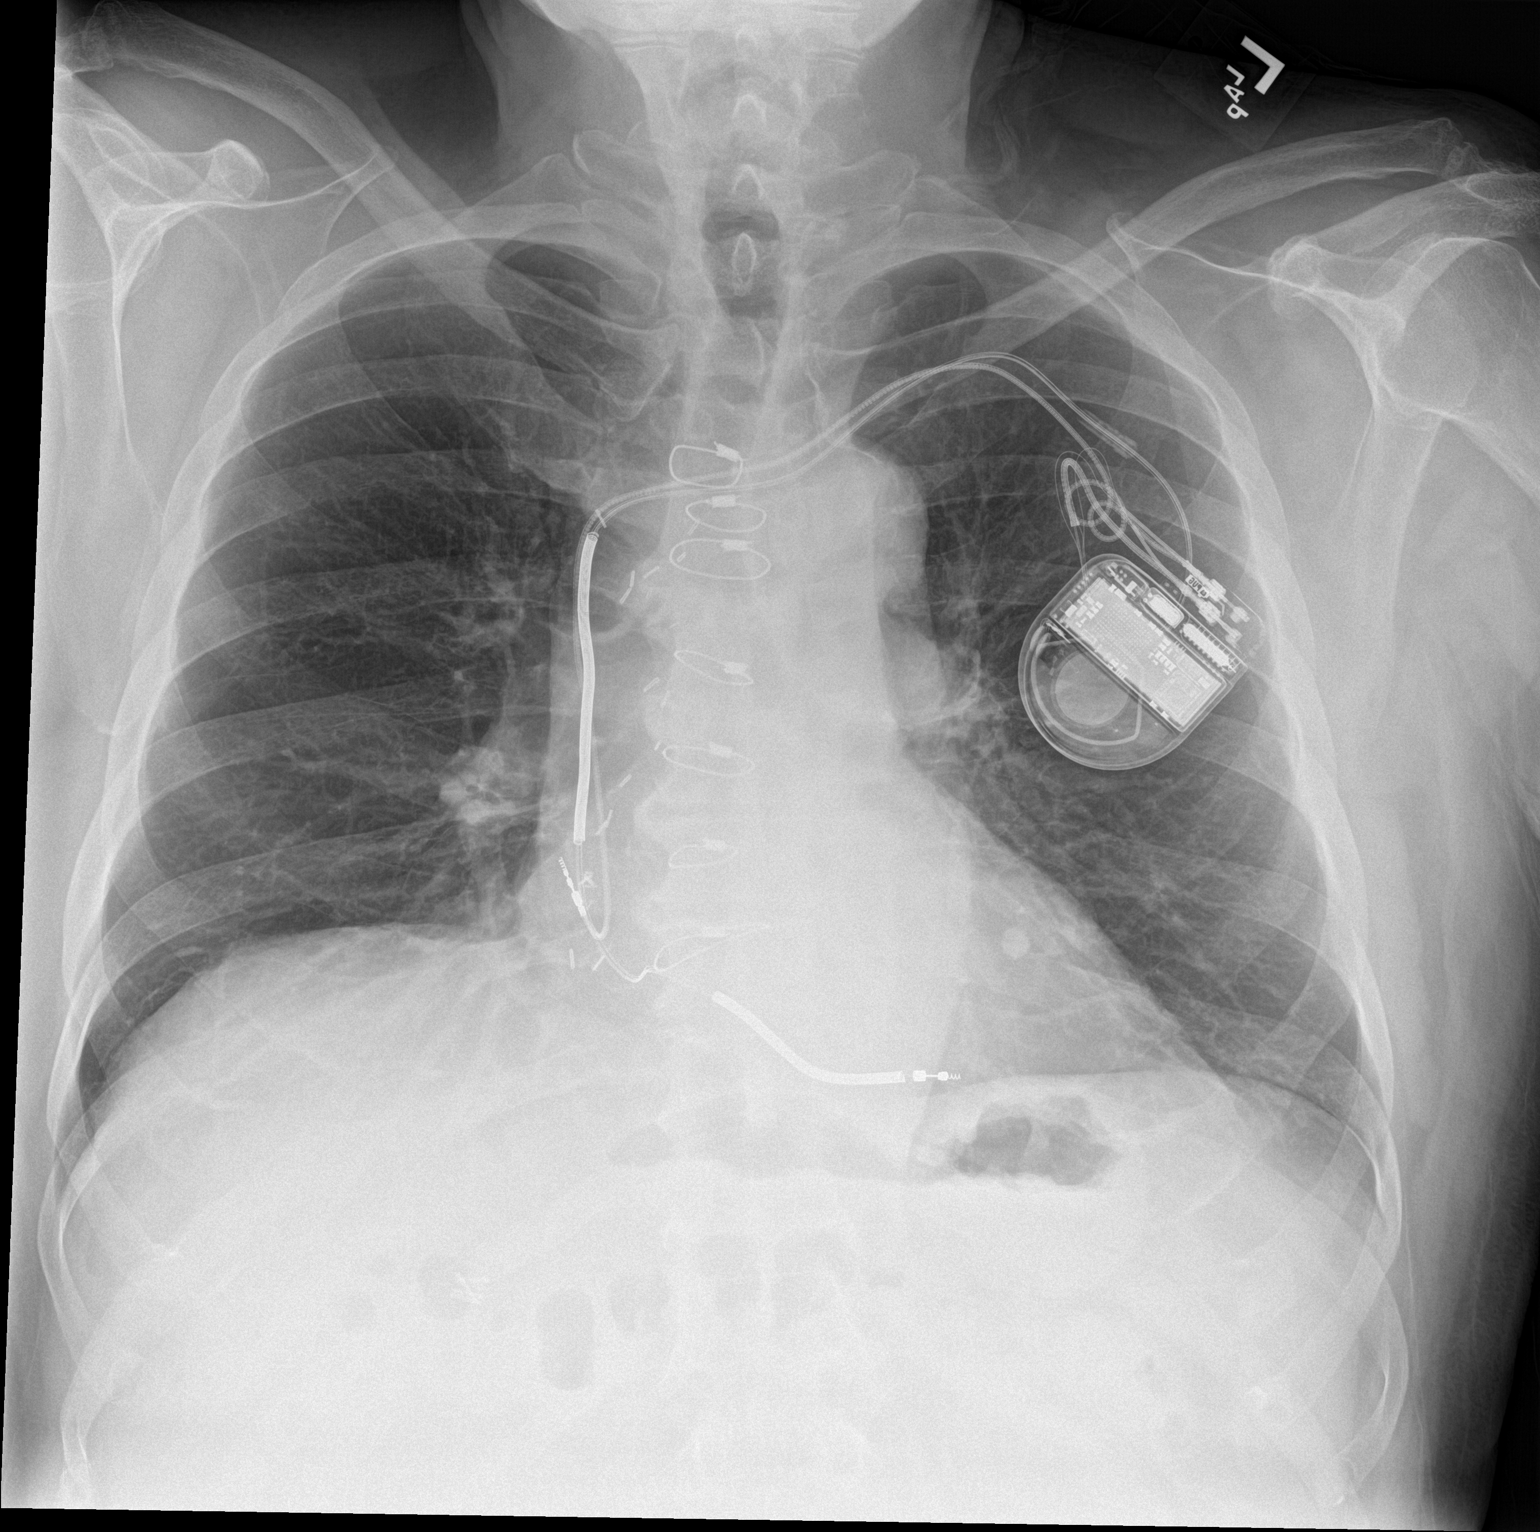

[chest lat]
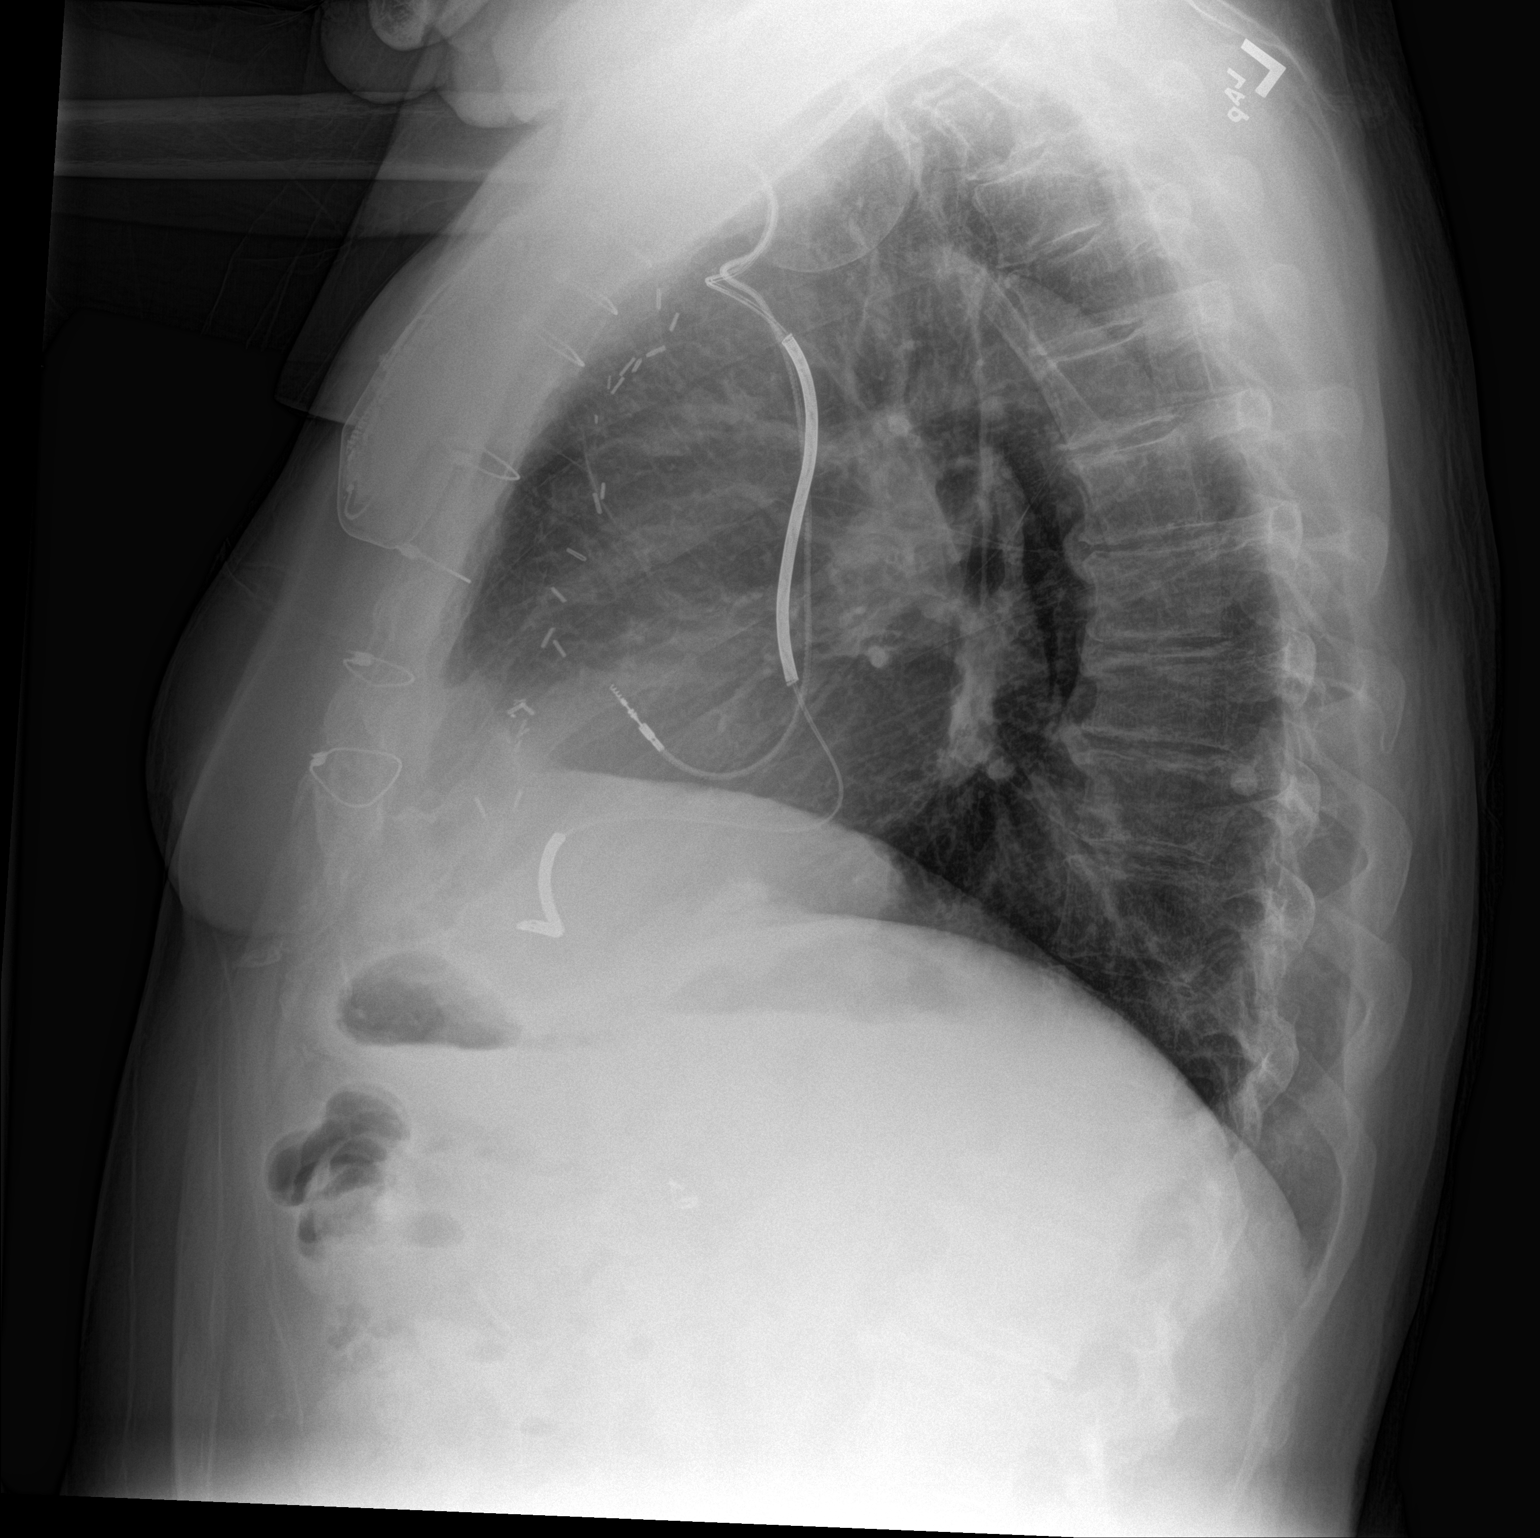

[2 of 2 positions shown; findings below may reference images not displayed]

FINDINGS: The left subclavian AICD leads appear unchanged. The heart size and
mediastinal contours are stable status post CABG. There is interval
improved aeration of the lungs which are now clear. There is mild
chronic central airway thickening. There is a stable calcified left
lower lobe granuloma. No pleural effusion or pneumothorax. Mild
degenerative changes throughout the spine are stable.
IMPRESSION: No acute cardiopulmonary process.

## 2017-07-12 DIAGNOSIS — M5136 Other intervertebral disc degeneration, lumbar region: Secondary | ICD-10-CM | POA: Diagnosis not present

## 2017-07-12 DIAGNOSIS — M5416 Radiculopathy, lumbar region: Secondary | ICD-10-CM | POA: Diagnosis not present

## 2017-07-12 DIAGNOSIS — M4726 Other spondylosis with radiculopathy, lumbar region: Secondary | ICD-10-CM | POA: Diagnosis not present

## 2017-07-18 DIAGNOSIS — R791 Abnormal coagulation profile: Secondary | ICD-10-CM | POA: Diagnosis not present

## 2017-07-19 DIAGNOSIS — M5136 Other intervertebral disc degeneration, lumbar region: Secondary | ICD-10-CM | POA: Diagnosis not present

## 2017-07-19 DIAGNOSIS — M5416 Radiculopathy, lumbar region: Secondary | ICD-10-CM | POA: Diagnosis not present

## 2017-07-21 ENCOUNTER — Encounter: Payer: Self-pay | Admitting: Urology

## 2017-07-23 DIAGNOSIS — I480 Paroxysmal atrial fibrillation: Secondary | ICD-10-CM | POA: Diagnosis not present

## 2017-08-13 DIAGNOSIS — R0789 Other chest pain: Secondary | ICD-10-CM | POA: Diagnosis not present

## 2017-08-13 DIAGNOSIS — I25810 Atherosclerosis of coronary artery bypass graft(s) without angina pectoris: Secondary | ICD-10-CM | POA: Diagnosis not present

## 2017-08-13 DIAGNOSIS — R0602 Shortness of breath: Secondary | ICD-10-CM | POA: Diagnosis not present

## 2017-08-13 DIAGNOSIS — R1084 Generalized abdominal pain: Secondary | ICD-10-CM | POA: Diagnosis not present

## 2017-08-16 DIAGNOSIS — I255 Ischemic cardiomyopathy: Secondary | ICD-10-CM | POA: Diagnosis not present

## 2017-08-16 DIAGNOSIS — I5022 Chronic systolic (congestive) heart failure: Secondary | ICD-10-CM | POA: Diagnosis not present

## 2017-08-16 DIAGNOSIS — I340 Nonrheumatic mitral (valve) insufficiency: Secondary | ICD-10-CM | POA: Diagnosis not present

## 2017-08-16 DIAGNOSIS — E782 Mixed hyperlipidemia: Secondary | ICD-10-CM | POA: Diagnosis not present

## 2017-08-20 DIAGNOSIS — Z7901 Long term (current) use of anticoagulants: Secondary | ICD-10-CM | POA: Diagnosis not present

## 2017-08-21 DIAGNOSIS — L720 Epidermal cyst: Secondary | ICD-10-CM | POA: Diagnosis not present

## 2017-08-21 DIAGNOSIS — L905 Scar conditions and fibrosis of skin: Secondary | ICD-10-CM | POA: Diagnosis not present

## 2017-08-21 DIAGNOSIS — C44329 Squamous cell carcinoma of skin of other parts of face: Secondary | ICD-10-CM | POA: Diagnosis not present

## 2017-08-21 DIAGNOSIS — L812 Freckles: Secondary | ICD-10-CM | POA: Diagnosis not present

## 2017-08-21 DIAGNOSIS — D485 Neoplasm of uncertain behavior of skin: Secondary | ICD-10-CM | POA: Diagnosis not present

## 2017-08-21 DIAGNOSIS — D1800 Hemangioma unspecified site: Secondary | ICD-10-CM | POA: Diagnosis not present

## 2017-08-21 DIAGNOSIS — D229 Melanocytic nevi, unspecified: Secondary | ICD-10-CM | POA: Diagnosis not present

## 2017-08-21 DIAGNOSIS — L820 Inflamed seborrheic keratosis: Secondary | ICD-10-CM | POA: Diagnosis not present

## 2017-08-26 DIAGNOSIS — Z7901 Long term (current) use of anticoagulants: Secondary | ICD-10-CM | POA: Diagnosis not present

## 2017-08-27 DIAGNOSIS — Z7901 Long term (current) use of anticoagulants: Secondary | ICD-10-CM | POA: Diagnosis not present

## 2017-08-27 DIAGNOSIS — M5416 Radiculopathy, lumbar region: Secondary | ICD-10-CM | POA: Diagnosis not present

## 2017-08-27 DIAGNOSIS — M5136 Other intervertebral disc degeneration, lumbar region: Secondary | ICD-10-CM | POA: Diagnosis not present

## 2017-09-03 DIAGNOSIS — N390 Urinary tract infection, site not specified: Secondary | ICD-10-CM | POA: Diagnosis not present

## 2017-09-03 DIAGNOSIS — Z794 Long term (current) use of insulin: Secondary | ICD-10-CM | POA: Diagnosis not present

## 2017-09-03 DIAGNOSIS — I10 Essential (primary) hypertension: Secondary | ICD-10-CM | POA: Diagnosis not present

## 2017-09-03 DIAGNOSIS — I480 Paroxysmal atrial fibrillation: Secondary | ICD-10-CM | POA: Diagnosis not present

## 2017-09-03 DIAGNOSIS — R319 Hematuria, unspecified: Secondary | ICD-10-CM | POA: Diagnosis not present

## 2017-09-03 DIAGNOSIS — D62 Acute posthemorrhagic anemia: Secondary | ICD-10-CM | POA: Diagnosis not present

## 2017-09-03 DIAGNOSIS — E1140 Type 2 diabetes mellitus with diabetic neuropathy, unspecified: Secondary | ICD-10-CM | POA: Diagnosis not present

## 2017-09-03 DIAGNOSIS — I25810 Atherosclerosis of coronary artery bypass graft(s) without angina pectoris: Secondary | ICD-10-CM | POA: Diagnosis not present

## 2017-09-03 DIAGNOSIS — I255 Ischemic cardiomyopathy: Secondary | ICD-10-CM | POA: Diagnosis not present

## 2017-09-10 DIAGNOSIS — I5022 Chronic systolic (congestive) heart failure: Secondary | ICD-10-CM | POA: Diagnosis not present

## 2017-09-10 DIAGNOSIS — N390 Urinary tract infection, site not specified: Secondary | ICD-10-CM | POA: Diagnosis not present

## 2017-09-10 DIAGNOSIS — R251 Tremor, unspecified: Secondary | ICD-10-CM | POA: Diagnosis not present

## 2017-09-10 DIAGNOSIS — I10 Essential (primary) hypertension: Secondary | ICD-10-CM | POA: Diagnosis not present

## 2017-09-10 DIAGNOSIS — E039 Hypothyroidism, unspecified: Secondary | ICD-10-CM | POA: Diagnosis not present

## 2017-09-10 DIAGNOSIS — E782 Mixed hyperlipidemia: Secondary | ICD-10-CM | POA: Diagnosis not present

## 2017-09-10 DIAGNOSIS — I25810 Atherosclerosis of coronary artery bypass graft(s) without angina pectoris: Secondary | ICD-10-CM | POA: Diagnosis not present

## 2017-09-10 DIAGNOSIS — M5441 Lumbago with sciatica, right side: Secondary | ICD-10-CM | POA: Diagnosis not present

## 2017-09-10 DIAGNOSIS — I255 Ischemic cardiomyopathy: Secondary | ICD-10-CM | POA: Diagnosis not present

## 2017-10-07 DIAGNOSIS — L905 Scar conditions and fibrosis of skin: Secondary | ICD-10-CM | POA: Diagnosis not present

## 2017-10-08 DIAGNOSIS — M5136 Other intervertebral disc degeneration, lumbar region: Secondary | ICD-10-CM | POA: Diagnosis not present

## 2017-10-08 DIAGNOSIS — Z7901 Long term (current) use of anticoagulants: Secondary | ICD-10-CM | POA: Diagnosis not present

## 2017-10-08 DIAGNOSIS — M5416 Radiculopathy, lumbar region: Secondary | ICD-10-CM | POA: Diagnosis not present

## 2017-10-14 ENCOUNTER — Other Ambulatory Visit: Payer: Self-pay | Admitting: Family Medicine

## 2017-10-14 DIAGNOSIS — M5416 Radiculopathy, lumbar region: Secondary | ICD-10-CM

## 2017-10-18 ENCOUNTER — Ambulatory Visit
Admission: RE | Admit: 2017-10-18 | Discharge: 2017-10-18 | Disposition: A | Discharge status: Home / Self Care | Payer: Medicare HMO | Source: Ambulatory Visit | Attending: Family Medicine | Admitting: Family Medicine

## 2017-10-18 DIAGNOSIS — M5416 Radiculopathy, lumbar region: Secondary | ICD-10-CM

## 2017-10-18 DIAGNOSIS — M5116 Intervertebral disc disorders with radiculopathy, lumbar region: Secondary | ICD-10-CM | POA: Diagnosis not present

## 2017-10-18 DIAGNOSIS — M48061 Spinal stenosis, lumbar region without neurogenic claudication: Secondary | ICD-10-CM | POA: Insufficient documentation

## 2017-10-22 DIAGNOSIS — I480 Paroxysmal atrial fibrillation: Secondary | ICD-10-CM | POA: Diagnosis not present

## 2017-11-07 ENCOUNTER — Other Ambulatory Visit: Payer: Self-pay

## 2017-11-07 ENCOUNTER — Emergency Department
Admission: EM | Admit: 2017-11-07 | Discharge: 2017-11-07 | Disposition: A | Discharge status: Home / Self Care | Payer: Medicare HMO | Attending: Emergency Medicine | Admitting: Emergency Medicine

## 2017-11-07 ENCOUNTER — Emergency Department: Payer: Medicare HMO

## 2017-11-07 ENCOUNTER — Encounter: Payer: Self-pay | Admitting: Emergency Medicine

## 2017-11-07 DIAGNOSIS — Y9384 Activity, sleeping: Secondary | ICD-10-CM | POA: Insufficient documentation

## 2017-11-07 DIAGNOSIS — I509 Heart failure, unspecified: Secondary | ICD-10-CM | POA: Diagnosis not present

## 2017-11-07 DIAGNOSIS — E039 Hypothyroidism, unspecified: Secondary | ICD-10-CM | POA: Insufficient documentation

## 2017-11-07 DIAGNOSIS — Z794 Long term (current) use of insulin: Secondary | ICD-10-CM | POA: Insufficient documentation

## 2017-11-07 DIAGNOSIS — S62663A Nondisplaced fracture of distal phalanx of left middle finger, initial encounter for closed fracture: Secondary | ICD-10-CM | POA: Diagnosis not present

## 2017-11-07 DIAGNOSIS — Z950 Presence of cardiac pacemaker: Secondary | ICD-10-CM | POA: Insufficient documentation

## 2017-11-07 DIAGNOSIS — I252 Old myocardial infarction: Secondary | ICD-10-CM | POA: Insufficient documentation

## 2017-11-07 DIAGNOSIS — Y999 Unspecified external cause status: Secondary | ICD-10-CM | POA: Diagnosis not present

## 2017-11-07 DIAGNOSIS — S62669A Nondisplaced fracture of distal phalanx of unspecified finger, initial encounter for closed fracture: Secondary | ICD-10-CM | POA: Insufficient documentation

## 2017-11-07 DIAGNOSIS — I110 Hypertensive heart disease with heart failure: Secondary | ICD-10-CM | POA: Insufficient documentation

## 2017-11-07 DIAGNOSIS — Z79899 Other long term (current) drug therapy: Secondary | ICD-10-CM | POA: Diagnosis not present

## 2017-11-07 DIAGNOSIS — Z85828 Personal history of other malignant neoplasm of skin: Secondary | ICD-10-CM | POA: Insufficient documentation

## 2017-11-07 DIAGNOSIS — E119 Type 2 diabetes mellitus without complications: Secondary | ICD-10-CM | POA: Insufficient documentation

## 2017-11-07 DIAGNOSIS — I2510 Atherosclerotic heart disease of native coronary artery without angina pectoris: Secondary | ICD-10-CM | POA: Diagnosis not present

## 2017-11-07 DIAGNOSIS — W06XXXA Fall from bed, initial encounter: Secondary | ICD-10-CM | POA: Diagnosis not present

## 2017-11-07 DIAGNOSIS — Y92003 Bedroom of unspecified non-institutional (private) residence as the place of occurrence of the external cause: Secondary | ICD-10-CM | POA: Insufficient documentation

## 2017-11-07 DIAGNOSIS — S0990XA Unspecified injury of head, initial encounter: Secondary | ICD-10-CM | POA: Diagnosis not present

## 2017-11-07 DIAGNOSIS — S098XXA Other specified injuries of head, initial encounter: Secondary | ICD-10-CM | POA: Diagnosis not present

## 2017-11-07 DIAGNOSIS — S62631A Displaced fracture of distal phalanx of left index finger, initial encounter for closed fracture: Secondary | ICD-10-CM | POA: Diagnosis not present

## 2017-11-07 DIAGNOSIS — S6982XA Other specified injuries of left wrist, hand and finger(s), initial encounter: Secondary | ICD-10-CM | POA: Diagnosis present

## 2017-11-07 MED ORDER — OXYCODONE-ACETAMINOPHEN 5-325 MG PO TABS
1.0000 | ORAL_TABLET | Freq: Once | ORAL | Status: DC
  Filled 2017-11-07: qty 1

## 2017-11-07 NOTE — ED Notes (Signed)
Pt unable to sign due to topaz malfnx. Pt in NAD, VSS, pt in wheelchair. Pt verbalizes d/c understanding and follow up

## 2017-11-07 NOTE — ED Triage Notes (Signed)
[place] out of bed this morning at 0330. Landed on left hand and knee.  Was seen through [facility] and they sent patient to ED for evaluation of fall.  Patient's only complaint is left third finger pain and bruising to finger tip.

## 2017-11-07 NOTE — ED Provider Notes (Signed)
[facility] Emergency Department Provider Note  ____________________________________________   I have reviewed the triage vital signs and the nursing notes. Where available I have reviewed prior notes and, if possible and indicated, outside hospital notes.    HISTORY  Chief Complaint No chief complaint on file.    HPI Dakota Thomas is a 84 y.o. male who is on Coumadin, states he is in a normal state of health he rolled out of bed this morning, landed on his left hand.  Patient states he thinks he probably hit his head he is not sure.  He did not pass out.  He has bruising and tenderness to the third finger of the left hand analysis chief complaint.  He states that he does sometimes get headaches because he is on oxycodone for his back, and he is a little worried that he will not be able to tell if there is a problem with his head after this fall.  For that reason I think he was sent here.  He denies any fever chills numbness or weakness or headache at this time.  He did fall about 14 hours ago.  He has bruising to the area of the finger distal to the DIP joint which is where it hurts with most of his tenderness is focused to the tip of the finger itself.  He does not complain of pain under the nail     Past Medical History:  Diagnosis Date  . A-fib ([place])   . AICD (automatic cardioverter/defibrillator) present   . Allergic state   . Anxiety   . Arrhythmia    atrial fib  . BPH (benign prostatic hyperplasia)   . CAD (coronary artery disease)   . Cancer (HCC)    skin of the nose and arm  . CHF (congestive heart failure) ([place])   . Diabetes mellitus without complication ([place])   . Family history of adverse reaction to anesthesia    daughter sleeps a long time  . H/O diverticulitis of colon   . Heart attack ([place]) 1985, 1995,2000, 2004,    multiple MI's  . Heart disease   . Heartburn   . History of kidney stones   . History of stomach ulcers   . HLD  (hyperlipidemia)   . Hypertension   . Hypothyroidism   . Ischemic cardiomyopathy   . Kidney stones   . Neuropathy   . Pacemaker   . Post herpetic neuralgia    left side of face  . Renal disorder    UTI's  . Rosacea   . Testosterone deficiency   . Tremors of nervous system   . Trigeminal neuralgia of left side of face     Patient Active Problem List   Diagnosis Date Noted  . BPH with obstruction/lower urinary tract symptoms 12/25/2015  . Bacteremia 11/24/2015  . Near syncope 11/22/2015  . Diabetes ([place]) 11/22/2015  . CAD (coronary artery disease) 11/22/2015  . HTN (hypertension) 11/22/2015  . A-fib ([place]) 11/22/2015  . BPH (benign prostatic hyperplasia) 11/22/2015    Past Surgical History:  Procedure Laterality Date  . APPENDECTOMY    . BACK SURGERY     cervical diskectomy  . CHOLECYSTECTOMY    . COLONOSCOPY WITH PROPOFOL N/A 01/27/2016   Procedure: COLONOSCOPY WITH PROPOFOL;  Surgeon: Manya Silvas, MD;  Location: [facility] ENDOSCOPY;  Service: Endoscopy;  Laterality: N/A;  . CORONARY ANGIOPLASTY WITH STENT PLACEMENT     multiple times  . CORONARY ARTERY BYPASS GRAFT    .  eyelids    . GREEN LIGHT LASER TURP (TRANSURETHRAL RESECTION OF PROSTATE N/A 03/19/2017   Procedure: GREEN LIGHT LASER TURP (TRANSURETHRAL RESECTION OF PROSTATE;  Surgeon: Royston Cowper, MD;  Location: [facility] ORS;  Service: Urology;  Laterality: N/A;  . IMPLANTABLE CARDIOVERTER DEFIBRILLATOR (ICD) GENERATOR CHANGE Left 05/30/2016   Procedure: ICD GENERATOR CHANGE;  Surgeon: Isaias Cowman, MD;  Location: [facility] ORS;  Service: Cardiovascular;  Laterality: Left;  . kidney stones    . NASAL SEPTUM SURGERY    . ostate surgery    . PACEMAKER INSERTION    . prostate heating     seven years ago  . TEE WITHOUT CARDIOVERSION N/A 11/28/2015   Procedure: Transesophageal Echocardiogram (Tee);  Surgeon: Teodoro Spray, MD;  Location: [facility] ORS;  Service: Cardiovascular;  Laterality: N/A;  . THYROID SURGERY    .  TONSILLECTOMY     x 2    Prior to Admission medications   Medication Sig Start Date End Date Taking? Authorizing Provider  acetaminophen (TYLENOL) 500 MG tablet Take 500-1,000 mg by mouth every 6 (six) hours as needed for mild pain.     [provider]  ALPRAZolam Duanne Moron) 0.5 MG tablet Take 0.5 mg by mouth 2 (two) times daily.     [provider]  amiodarone (PACERONE) 200 MG tablet Take 100 mg by mouth daily.    [provider]  ASPERCREME W/LIDOCAINE EX Apply 1 application topically daily as needed (back pain).    [provider]  atorvastatin (LIPITOR) 40 MG tablet Take 40 mg by mouth every evening.     [provider]  cloNIDine (CATAPRES) 0.1 MG tablet Take 0.1 mg by mouth 2 (two) times daily as needed (If diastolic BP >166).  0/63/01 12/06/17  [provider]  docusate sodium (COLACE) 100 MG capsule Take 2 capsules (200 mg total) by mouth 2 (two) times daily. 03/19/17   Royston Cowper, MD  gabapentin (NEURONTIN) 600 MG tablet Take 600 mg by mouth 2 (two) times daily.     [provider]  HYDROcodone-acetaminophen (NORCO/VICODIN) 5-325 MG tablet Take 1 tablet by mouth daily as needed (pain).  11/30/16   [provider]  insulin aspart (NOVOLOG) 100 UNIT/ML FlexPen Inject 0-6 Units into the skin 2 (two) times daily before a meal. Dose per sliding scale    [provider]  insulin glargine (LANTUS) 100 unit/mL SOPN Inject 25 Units into the skin at bedtime.     [provider]  levothyroxine (SYNTHROID, LEVOTHROID) 100 MCG tablet Take 100 mcg by mouth daily before breakfast.    [provider]  loperamide (IMODIUM) 2 MG capsule Take 2 mg by mouth as needed for diarrhea or loose stools. Max 8 capsules per day    [provider]  metoprolol succinate (TOPROL-XL) 25 MG 24 hr tablet Take 25 mg by mouth daily.  10/10/15   [provider]  Multiple Vitamin (MULTIVITAMIN WITH MINERALS)  TABS tablet Take 1 tablet by mouth daily.    [provider]  nitrofurantoin (MACRODANTIN) 100 MG capsule Take 1 capsule (100 mg total) by mouth 2 (two) times daily. 03/19/17   Royston Cowper, MD  omeprazole (PRILOSEC) 20 MG capsule Take 20 mg by mouth See admin instructions. Takes 20mg  every morning. Takes an additional 20mg  in the evening as needed for acid reflux    [provider]  ondansetron (ZOFRAN ODT) 8 MG disintegrating tablet Take 0.5 tablets (4 mg total) by mouth every 6 (  six) hours as needed for nausea or vomiting. 03/19/17   Royston Cowper, MD  Potassium Gluconate 550 (90 K) MG TABS Take 90 mg by mouth every other day.    [provider]  pyridOXINE (B-6) 50 MG tablet Take 100 mg by mouth daily.     [provider]  spironolactone (ALDACTONE) 25 MG tablet Take 25 mg by mouth daily.  10/10/15   [provider]  tamsulosin (FLOMAX) 0.4 MG CAPS capsule Take 2 capsules (0.8 mg total) by mouth daily. Patient taking differently: Take 0.4 mg by mouth at bedtime.  12/18/16   Zara Council A, PA-C  testosterone cypionate (DEPOTESTOSTERONE CYPIONATE) 200 MG/ML injection INJECT 200mg (1 ML) IN THE MUSCLE EVERY 28 DAYS 01/10/17   [provider]    Allergies Levaquin [levofloxacin]; Ciprofloxacin; Lidocaine; Metformin; Tannic acid; and Tamsulosin hcl  Family History  Problem Relation Age of Onset  . CAD Unknown   . Diabetes Unknown   . Hypertension Unknown   . Bladder Cancer Paternal Grandfather   . Kidney disease Neg Hx   . Prostate cancer Neg Hx   . Kidney cancer Neg Hx     Social History Social History   Tobacco Use  . Smoking status: Former Smoker    Types: Cigarettes    Last attempt to quit: 05/22/1984    Years since quitting: 33.4  . Smokeless tobacco: Never Used  Substance Use Topics  . Alcohol use: Yes    Alcohol/week: 0.0 - 0.6 oz    Comment: 1 glass of wine per month  . Drug use: No    Review of  Systems Constitutional: No fever/chills Eyes: No visual changes. ENT: No sore throat. No stiff neck no neck pain Cardiovascular: Denies chest pain. Respiratory: Denies shortness of breath. Gastrointestinal:   no vomiting.  No diarrhea.  No constipation. Genitourinary: Negative for dysuria. Musculoskeletal: Negative lower extremity swelling Skin: Negative for rash. Neurological: Negative for severe headaches, focal weakness or numbness.   ____________________________________________   PHYSICAL EXAM:  VITAL SIGNS: ED Triage Vitals  Enc Vitals Group     BP 11/07/17 1705 (!) 165/79     Pulse Rate 11/07/17 1705 75     Resp 11/07/17 1705 16     Temp 11/07/17 1705 97.8 F (36.6 C)     Temp Source 11/07/17 1705 Oral     SpO2 11/07/17 1706 98 %     Weight 11/07/17 1703 212 lb (96.2 kg)     Height 11/07/17 1703 6\' 1"  (1.854 m)     Head Circumference --      Peak Flow --      Pain Score 11/07/17 1703 7     Pain Loc --      Pain Edu? --      Excl. in [place]? --     Constitutional: Alert and oriented. Well appearing and in no acute distress. Eyes: Conjunctivae are normal Head: Atraumatic HEENT: No congestion/rhinnorhea. Mucous membranes are moist.  Oropharynx non-erythematous Neck:   Nontender with no meningismus, no masses, no stridor Cardiovascular: Normal rate, regular rhythm. Grossly normal heart sounds.  Good peripheral circulation. Respiratory: Normal respiratory effort.  No retractions. Lungs CTAB. Abdominal: Soft and nontender. No distention. No guarding no rebound Back:  There is no focal tenderness or step off.  there is no midline tenderness there are no lesions noted. there is no CVA tenderness Musculoskeletal: No lower extremity tenderness, no upper extremity tenderness. No joint effusions, no DVT signs strong distal pulses  no edema, left hand shows bruising distal to the DIP joint on the third finger.  It is tender.  There is no significant subungual hematoma, yet he does  not have pain to palpation of the nail to suggest that there is bleeding under pressure there.  Most of his pain is localized to the very tip of the finger where he has tenderness but no other injury noted.  Flexion and extension is normal throughout all ranges of his tendons and he has good cap refill Neurologic:  Normal speech and language. No gross focal neurologic deficits are appreciated.  Skin:  Skin is warm, dry and intact. No rash noted.  Oozing noted to the hand see above Psychiatric: Mood and affect are normal. Speech and behavior are normal.  ____________________________________________   LABS (all labs ordered are listed, but only abnormal results are displayed)  Labs Reviewed - No data to display  Pertinent labs  results that were available during my care of the patient were reviewed by me and considered in my medical decision making (see chart for details). ____________________________________________  EKG  I personally interpreted any EKGs ordered by me or triage  ____________________________________________  RADIOLOGY  Pertinent labs & imaging results that were available during my care of the patient were reviewed by me and considered in my medical decision making (see chart for details). If possible, patient and/or family made aware of any abnormal findings.  No results found. ____________________________________________    PROCEDURES  Procedure(s) performed: None  Procedures  Critical Care performed: None  ____________________________________________   INITIAL IMPRESSION / ASSESSMENT AND PLAN / ED COURSE  Pertinent labs & imaging results that were available during my care of the patient were reviewed by me and considered in my medical decision making (see chart for details).  Non-syncopal fall on Coumadin did hit his head, is worried that when he gets his daily headache he will not be able to know if it is okay because of his head injury or possible head  injury.  Will obtain imaging as a precaution, he has bruising which is tender to his finger, no evidence of significant injury but we will obtain x-ray of that as well.  No further work-up indicated at this time.  No other injury complaint of her noted    ____________________________________________   FINAL CLINICAL IMPRESSION(S) / ED DIAGNOSES  Final diagnoses:  None      This chart was dictated using voice recognition software.  Despite best efforts to proofread,  errors can occur which can change meaning.      Schuyler Amor, MD 11/07/17 [phone]

## 2017-11-12 DIAGNOSIS — L720 Epidermal cyst: Secondary | ICD-10-CM | POA: Diagnosis not present

## 2017-11-12 DIAGNOSIS — M5416 Radiculopathy, lumbar region: Secondary | ICD-10-CM | POA: Diagnosis not present

## 2017-11-12 DIAGNOSIS — W06XXXA Fall from bed, initial encounter: Secondary | ICD-10-CM | POA: Diagnosis not present

## 2017-11-12 DIAGNOSIS — M48062 Spinal stenosis, lumbar region with neurogenic claudication: Secondary | ICD-10-CM | POA: Diagnosis not present

## 2017-11-12 DIAGNOSIS — S62633A Displaced fracture of distal phalanx of left middle finger, initial encounter for closed fracture: Secondary | ICD-10-CM | POA: Diagnosis not present

## 2017-11-12 DIAGNOSIS — M5136 Other intervertebral disc degeneration, lumbar region: Secondary | ICD-10-CM | POA: Diagnosis not present

## 2017-11-12 DIAGNOSIS — Z5181 Encounter for therapeutic drug level monitoring: Secondary | ICD-10-CM | POA: Diagnosis not present

## 2017-11-12 DIAGNOSIS — D485 Neoplasm of uncertain behavior of skin: Secondary | ICD-10-CM | POA: Diagnosis not present

## 2017-11-12 DIAGNOSIS — Z7901 Long term (current) use of anticoagulants: Secondary | ICD-10-CM | POA: Diagnosis not present

## 2017-11-12 DIAGNOSIS — I480 Paroxysmal atrial fibrillation: Secondary | ICD-10-CM | POA: Diagnosis not present

## 2017-11-18 DIAGNOSIS — L820 Inflamed seborrheic keratosis: Secondary | ICD-10-CM | POA: Diagnosis not present

## 2017-11-18 DIAGNOSIS — L905 Scar conditions and fibrosis of skin: Secondary | ICD-10-CM | POA: Diagnosis not present

## 2017-11-18 DIAGNOSIS — L720 Epidermal cyst: Secondary | ICD-10-CM | POA: Diagnosis not present

## 2017-11-25 DIAGNOSIS — M2560 Stiffness of unspecified joint, not elsewhere classified: Secondary | ICD-10-CM | POA: Diagnosis not present

## 2017-11-25 DIAGNOSIS — M5136 Other intervertebral disc degeneration, lumbar region: Secondary | ICD-10-CM | POA: Diagnosis not present

## 2017-11-25 DIAGNOSIS — M5441 Lumbago with sciatica, right side: Secondary | ICD-10-CM | POA: Diagnosis not present

## 2017-11-25 DIAGNOSIS — M6281 Muscle weakness (generalized): Secondary | ICD-10-CM | POA: Diagnosis not present

## 2017-11-28 DIAGNOSIS — M5136 Other intervertebral disc degeneration, lumbar region: Secondary | ICD-10-CM | POA: Diagnosis not present

## 2017-12-03 DIAGNOSIS — M5136 Other intervertebral disc degeneration, lumbar region: Secondary | ICD-10-CM | POA: Diagnosis not present

## 2017-12-05 DIAGNOSIS — M5136 Other intervertebral disc degeneration, lumbar region: Secondary | ICD-10-CM | POA: Diagnosis not present

## 2017-12-12 DIAGNOSIS — M5136 Other intervertebral disc degeneration, lumbar region: Secondary | ICD-10-CM | POA: Diagnosis not present

## 2017-12-17 DIAGNOSIS — I10 Essential (primary) hypertension: Secondary | ICD-10-CM | POA: Diagnosis not present

## 2017-12-17 DIAGNOSIS — Z794 Long term (current) use of insulin: Secondary | ICD-10-CM | POA: Diagnosis not present

## 2017-12-17 DIAGNOSIS — E1140 Type 2 diabetes mellitus with diabetic neuropathy, unspecified: Secondary | ICD-10-CM | POA: Diagnosis not present

## 2017-12-17 DIAGNOSIS — I25810 Atherosclerosis of coronary artery bypass graft(s) without angina pectoris: Secondary | ICD-10-CM | POA: Diagnosis not present

## 2017-12-17 DIAGNOSIS — E782 Mixed hyperlipidemia: Secondary | ICD-10-CM | POA: Diagnosis not present

## 2017-12-17 DIAGNOSIS — M5136 Other intervertebral disc degeneration, lumbar region: Secondary | ICD-10-CM | POA: Diagnosis not present

## 2017-12-17 DIAGNOSIS — I513 Intracardiac thrombosis, not elsewhere classified: Secondary | ICD-10-CM | POA: Diagnosis not present

## 2017-12-18 ENCOUNTER — Ambulatory Visit: Payer: Medicare HMO | Admitting: Urology

## 2017-12-25 DIAGNOSIS — M5136 Other intervertebral disc degeneration, lumbar region: Secondary | ICD-10-CM | POA: Diagnosis not present

## 2017-12-31 DIAGNOSIS — I25810 Atherosclerosis of coronary artery bypass graft(s) without angina pectoris: Secondary | ICD-10-CM | POA: Diagnosis not present

## 2017-12-31 DIAGNOSIS — E039 Hypothyroidism, unspecified: Secondary | ICD-10-CM | POA: Diagnosis not present

## 2017-12-31 DIAGNOSIS — Z794 Long term (current) use of insulin: Secondary | ICD-10-CM | POA: Diagnosis not present

## 2017-12-31 DIAGNOSIS — I255 Ischemic cardiomyopathy: Secondary | ICD-10-CM | POA: Diagnosis not present

## 2017-12-31 DIAGNOSIS — E1140 Type 2 diabetes mellitus with diabetic neuropathy, unspecified: Secondary | ICD-10-CM | POA: Diagnosis not present

## 2017-12-31 DIAGNOSIS — R251 Tremor, unspecified: Secondary | ICD-10-CM | POA: Diagnosis not present

## 2017-12-31 DIAGNOSIS — I10 Essential (primary) hypertension: Secondary | ICD-10-CM | POA: Diagnosis not present

## 2017-12-31 DIAGNOSIS — E782 Mixed hyperlipidemia: Secondary | ICD-10-CM | POA: Diagnosis not present

## 2017-12-31 DIAGNOSIS — Z125 Encounter for screening for malignant neoplasm of prostate: Secondary | ICD-10-CM | POA: Diagnosis not present

## 2018-01-07 DIAGNOSIS — E119 Type 2 diabetes mellitus without complications: Secondary | ICD-10-CM | POA: Diagnosis not present

## 2018-01-08 DIAGNOSIS — E039 Hypothyroidism, unspecified: Secondary | ICD-10-CM | POA: Diagnosis not present

## 2018-01-08 DIAGNOSIS — M5136 Other intervertebral disc degeneration, lumbar region: Secondary | ICD-10-CM | POA: Diagnosis not present

## 2018-01-08 DIAGNOSIS — Z794 Long term (current) use of insulin: Secondary | ICD-10-CM | POA: Diagnosis not present

## 2018-01-08 DIAGNOSIS — I25810 Atherosclerosis of coronary artery bypass graft(s) without angina pectoris: Secondary | ICD-10-CM | POA: Diagnosis not present

## 2018-01-08 DIAGNOSIS — I5022 Chronic systolic (congestive) heart failure: Secondary | ICD-10-CM | POA: Diagnosis not present

## 2018-01-08 DIAGNOSIS — Z95810 Presence of automatic (implantable) cardiac defibrillator: Secondary | ICD-10-CM | POA: Diagnosis not present

## 2018-01-08 DIAGNOSIS — E1140 Type 2 diabetes mellitus with diabetic neuropathy, unspecified: Secondary | ICD-10-CM | POA: Diagnosis not present

## 2018-01-08 DIAGNOSIS — Z0000 Encounter for general adult medical examination without abnormal findings: Secondary | ICD-10-CM | POA: Diagnosis not present

## 2018-01-08 DIAGNOSIS — I255 Ischemic cardiomyopathy: Secondary | ICD-10-CM | POA: Diagnosis not present

## 2018-01-21 DIAGNOSIS — I480 Paroxysmal atrial fibrillation: Secondary | ICD-10-CM | POA: Diagnosis not present

## 2018-02-19 DIAGNOSIS — M48062 Spinal stenosis, lumbar region with neurogenic claudication: Secondary | ICD-10-CM | POA: Diagnosis not present

## 2018-02-19 DIAGNOSIS — M5136 Other intervertebral disc degeneration, lumbar region: Secondary | ICD-10-CM | POA: Diagnosis not present

## 2018-02-19 DIAGNOSIS — M5416 Radiculopathy, lumbar region: Secondary | ICD-10-CM | POA: Diagnosis not present

## 2018-02-19 DIAGNOSIS — Z7901 Long term (current) use of anticoagulants: Secondary | ICD-10-CM | POA: Diagnosis not present

## 2018-03-18 DIAGNOSIS — M48062 Spinal stenosis, lumbar region with neurogenic claudication: Secondary | ICD-10-CM | POA: Diagnosis not present

## 2018-03-18 DIAGNOSIS — Z7901 Long term (current) use of anticoagulants: Secondary | ICD-10-CM | POA: Diagnosis not present

## 2018-03-18 DIAGNOSIS — M5416 Radiculopathy, lumbar region: Secondary | ICD-10-CM | POA: Diagnosis not present

## 2018-03-18 DIAGNOSIS — M5136 Other intervertebral disc degeneration, lumbar region: Secondary | ICD-10-CM | POA: Diagnosis not present

## 2018-03-19 DIAGNOSIS — M5416 Radiculopathy, lumbar region: Secondary | ICD-10-CM | POA: Diagnosis not present

## 2018-03-19 DIAGNOSIS — Z7901 Long term (current) use of anticoagulants: Secondary | ICD-10-CM | POA: Diagnosis not present

## 2018-03-19 DIAGNOSIS — M48062 Spinal stenosis, lumbar region with neurogenic claudication: Secondary | ICD-10-CM | POA: Diagnosis not present

## 2018-03-19 DIAGNOSIS — M5136 Other intervertebral disc degeneration, lumbar region: Secondary | ICD-10-CM | POA: Diagnosis not present

## 2018-04-15 DIAGNOSIS — Z7901 Long term (current) use of anticoagulants: Secondary | ICD-10-CM | POA: Diagnosis not present

## 2018-04-16 DIAGNOSIS — M48062 Spinal stenosis, lumbar region with neurogenic claudication: Secondary | ICD-10-CM | POA: Diagnosis not present

## 2018-04-16 DIAGNOSIS — M5416 Radiculopathy, lumbar region: Secondary | ICD-10-CM | POA: Diagnosis not present

## 2018-04-16 DIAGNOSIS — M5136 Other intervertebral disc degeneration, lumbar region: Secondary | ICD-10-CM | POA: Diagnosis not present

## 2018-04-22 DIAGNOSIS — I480 Paroxysmal atrial fibrillation: Secondary | ICD-10-CM | POA: Diagnosis not present

## 2018-05-05 DIAGNOSIS — E1140 Type 2 diabetes mellitus with diabetic neuropathy, unspecified: Secondary | ICD-10-CM | POA: Diagnosis not present

## 2018-05-05 DIAGNOSIS — Z95810 Presence of automatic (implantable) cardiac defibrillator: Secondary | ICD-10-CM | POA: Diagnosis not present

## 2018-05-05 DIAGNOSIS — Z0000 Encounter for general adult medical examination without abnormal findings: Secondary | ICD-10-CM | POA: Diagnosis not present

## 2018-05-05 DIAGNOSIS — I255 Ischemic cardiomyopathy: Secondary | ICD-10-CM | POA: Diagnosis not present

## 2018-05-05 DIAGNOSIS — I5022 Chronic systolic (congestive) heart failure: Secondary | ICD-10-CM | POA: Diagnosis not present

## 2018-05-05 DIAGNOSIS — Z794 Long term (current) use of insulin: Secondary | ICD-10-CM | POA: Diagnosis not present

## 2018-05-05 DIAGNOSIS — I25810 Atherosclerosis of coronary artery bypass graft(s) without angina pectoris: Secondary | ICD-10-CM | POA: Diagnosis not present

## 2018-05-05 DIAGNOSIS — E039 Hypothyroidism, unspecified: Secondary | ICD-10-CM | POA: Diagnosis not present

## 2018-05-12 DIAGNOSIS — I10 Essential (primary) hypertension: Secondary | ICD-10-CM | POA: Diagnosis not present

## 2018-05-12 DIAGNOSIS — E1140 Type 2 diabetes mellitus with diabetic neuropathy, unspecified: Secondary | ICD-10-CM | POA: Diagnosis not present

## 2018-05-12 DIAGNOSIS — Z794 Long term (current) use of insulin: Secondary | ICD-10-CM | POA: Diagnosis not present

## 2018-05-12 DIAGNOSIS — I255 Ischemic cardiomyopathy: Secondary | ICD-10-CM | POA: Diagnosis not present

## 2018-05-12 DIAGNOSIS — M5416 Radiculopathy, lumbar region: Secondary | ICD-10-CM | POA: Diagnosis not present

## 2018-05-12 DIAGNOSIS — E039 Hypothyroidism, unspecified: Secondary | ICD-10-CM | POA: Diagnosis not present

## 2018-05-12 DIAGNOSIS — I25810 Atherosclerosis of coronary artery bypass graft(s) without angina pectoris: Secondary | ICD-10-CM | POA: Diagnosis not present

## 2018-05-12 DIAGNOSIS — Z95810 Presence of automatic (implantable) cardiac defibrillator: Secondary | ICD-10-CM | POA: Diagnosis not present

## 2018-05-19 DIAGNOSIS — Z7901 Long term (current) use of anticoagulants: Secondary | ICD-10-CM | POA: Diagnosis not present

## 2018-06-02 DIAGNOSIS — Z7901 Long term (current) use of anticoagulants: Secondary | ICD-10-CM | POA: Diagnosis not present

## 2018-06-03 DIAGNOSIS — M5136 Other intervertebral disc degeneration, lumbar region: Secondary | ICD-10-CM | POA: Diagnosis not present

## 2018-06-03 DIAGNOSIS — M48062 Spinal stenosis, lumbar region with neurogenic claudication: Secondary | ICD-10-CM | POA: Diagnosis not present

## 2018-06-03 DIAGNOSIS — M5416 Radiculopathy, lumbar region: Secondary | ICD-10-CM | POA: Diagnosis not present

## 2018-06-26 DIAGNOSIS — Z7901 Long term (current) use of anticoagulants: Secondary | ICD-10-CM | POA: Diagnosis not present

## 2018-07-04 DIAGNOSIS — R42 Dizziness and giddiness: Secondary | ICD-10-CM | POA: Diagnosis not present

## 2018-07-04 DIAGNOSIS — R05 Cough: Secondary | ICD-10-CM | POA: Diagnosis not present

## 2018-07-04 DIAGNOSIS — J22 Unspecified acute lower respiratory infection: Secondary | ICD-10-CM | POA: Diagnosis not present

## 2018-07-04 DIAGNOSIS — R6889 Other general symptoms and signs: Secondary | ICD-10-CM | POA: Diagnosis not present

## 2018-07-04 DIAGNOSIS — J029 Acute pharyngitis, unspecified: Secondary | ICD-10-CM | POA: Diagnosis not present

## 2018-07-05 DIAGNOSIS — R05 Cough: Secondary | ICD-10-CM | POA: Diagnosis not present

## 2018-07-10 DIAGNOSIS — Z794 Long term (current) use of insulin: Secondary | ICD-10-CM | POA: Diagnosis not present

## 2018-07-10 DIAGNOSIS — E1140 Type 2 diabetes mellitus with diabetic neuropathy, unspecified: Secondary | ICD-10-CM | POA: Diagnosis not present

## 2018-07-10 DIAGNOSIS — Z95810 Presence of automatic (implantable) cardiac defibrillator: Secondary | ICD-10-CM | POA: Diagnosis not present

## 2018-07-10 DIAGNOSIS — I5022 Chronic systolic (congestive) heart failure: Secondary | ICD-10-CM | POA: Diagnosis not present

## 2018-07-10 DIAGNOSIS — R251 Tremor, unspecified: Secondary | ICD-10-CM | POA: Diagnosis not present

## 2018-07-10 DIAGNOSIS — J449 Chronic obstructive pulmonary disease, unspecified: Secondary | ICD-10-CM | POA: Diagnosis not present

## 2018-07-10 DIAGNOSIS — E039 Hypothyroidism, unspecified: Secondary | ICD-10-CM | POA: Diagnosis not present

## 2018-07-22 DIAGNOSIS — I480 Paroxysmal atrial fibrillation: Secondary | ICD-10-CM | POA: Diagnosis not present

## 2018-07-25 DIAGNOSIS — E1140 Type 2 diabetes mellitus with diabetic neuropathy, unspecified: Secondary | ICD-10-CM | POA: Diagnosis not present

## 2018-07-25 DIAGNOSIS — I255 Ischemic cardiomyopathy: Secondary | ICD-10-CM | POA: Diagnosis not present

## 2018-07-25 DIAGNOSIS — Z794 Long term (current) use of insulin: Secondary | ICD-10-CM | POA: Diagnosis not present

## 2018-07-25 DIAGNOSIS — I472 Ventricular tachycardia: Secondary | ICD-10-CM | POA: Diagnosis not present

## 2018-07-25 DIAGNOSIS — I25810 Atherosclerosis of coronary artery bypass graft(s) without angina pectoris: Secondary | ICD-10-CM | POA: Diagnosis not present

## 2018-07-29 DIAGNOSIS — Z7901 Long term (current) use of anticoagulants: Secondary | ICD-10-CM | POA: Diagnosis not present

## 2018-08-05 DIAGNOSIS — E782 Mixed hyperlipidemia: Secondary | ICD-10-CM | POA: Diagnosis not present

## 2018-08-05 DIAGNOSIS — J209 Acute bronchitis, unspecified: Secondary | ICD-10-CM | POA: Diagnosis not present

## 2018-08-05 DIAGNOSIS — R1013 Epigastric pain: Secondary | ICD-10-CM | POA: Diagnosis not present

## 2018-08-05 DIAGNOSIS — I10 Essential (primary) hypertension: Secondary | ICD-10-CM | POA: Diagnosis not present

## 2018-08-05 DIAGNOSIS — I5022 Chronic systolic (congestive) heart failure: Secondary | ICD-10-CM | POA: Diagnosis not present

## 2018-08-05 DIAGNOSIS — Z87891 Personal history of nicotine dependence: Secondary | ICD-10-CM | POA: Diagnosis not present

## 2018-08-05 DIAGNOSIS — J440 Chronic obstructive pulmonary disease with acute lower respiratory infection: Secondary | ICD-10-CM | POA: Diagnosis not present

## 2018-08-05 DIAGNOSIS — I480 Paroxysmal atrial fibrillation: Secondary | ICD-10-CM | POA: Diagnosis not present

## 2018-08-14 DIAGNOSIS — R791 Abnormal coagulation profile: Secondary | ICD-10-CM | POA: Diagnosis not present

## 2018-08-28 DIAGNOSIS — Z95810 Presence of automatic (implantable) cardiac defibrillator: Secondary | ICD-10-CM | POA: Diagnosis not present

## 2018-08-28 DIAGNOSIS — R251 Tremor, unspecified: Secondary | ICD-10-CM | POA: Diagnosis not present

## 2018-08-28 DIAGNOSIS — I340 Nonrheumatic mitral (valve) insufficiency: Secondary | ICD-10-CM | POA: Diagnosis not present

## 2018-08-28 DIAGNOSIS — I255 Ischemic cardiomyopathy: Secondary | ICD-10-CM | POA: Diagnosis not present

## 2018-08-28 DIAGNOSIS — I25810 Atherosclerosis of coronary artery bypass graft(s) without angina pectoris: Secondary | ICD-10-CM | POA: Diagnosis not present

## 2018-08-28 DIAGNOSIS — E039 Hypothyroidism, unspecified: Secondary | ICD-10-CM | POA: Diagnosis not present

## 2018-08-28 DIAGNOSIS — I472 Ventricular tachycardia: Secondary | ICD-10-CM | POA: Diagnosis not present

## 2018-08-28 DIAGNOSIS — E1140 Type 2 diabetes mellitus with diabetic neuropathy, unspecified: Secondary | ICD-10-CM | POA: Diagnosis not present

## 2018-08-28 DIAGNOSIS — Z0000 Encounter for general adult medical examination without abnormal findings: Secondary | ICD-10-CM | POA: Diagnosis not present

## 2018-09-18 DIAGNOSIS — Z7901 Long term (current) use of anticoagulants: Secondary | ICD-10-CM | POA: Diagnosis not present

## 2018-10-20 DIAGNOSIS — Z7901 Long term (current) use of anticoagulants: Secondary | ICD-10-CM | POA: Diagnosis not present

## 2018-10-21 DIAGNOSIS — I5022 Chronic systolic (congestive) heart failure: Secondary | ICD-10-CM | POA: Diagnosis not present

## 2018-11-18 DIAGNOSIS — Z7901 Long term (current) use of anticoagulants: Secondary | ICD-10-CM | POA: Diagnosis not present

## 2018-12-04 DIAGNOSIS — R1310 Dysphagia, unspecified: Secondary | ICD-10-CM | POA: Diagnosis not present

## 2018-12-04 DIAGNOSIS — R1010 Upper abdominal pain, unspecified: Secondary | ICD-10-CM | POA: Diagnosis not present

## 2018-12-04 DIAGNOSIS — Z86010 Personal history of colonic polyps: Secondary | ICD-10-CM | POA: Diagnosis not present

## 2018-12-15 DIAGNOSIS — R251 Tremor, unspecified: Secondary | ICD-10-CM | POA: Diagnosis not present

## 2018-12-15 DIAGNOSIS — Z95810 Presence of automatic (implantable) cardiac defibrillator: Secondary | ICD-10-CM | POA: Diagnosis not present

## 2018-12-15 DIAGNOSIS — Z7901 Long term (current) use of anticoagulants: Secondary | ICD-10-CM | POA: Diagnosis not present

## 2018-12-15 DIAGNOSIS — Z794 Long term (current) use of insulin: Secondary | ICD-10-CM | POA: Diagnosis not present

## 2018-12-15 DIAGNOSIS — I472 Ventricular tachycardia: Secondary | ICD-10-CM | POA: Diagnosis not present

## 2018-12-15 DIAGNOSIS — E1140 Type 2 diabetes mellitus with diabetic neuropathy, unspecified: Secondary | ICD-10-CM | POA: Diagnosis not present

## 2018-12-15 DIAGNOSIS — I25810 Atherosclerosis of coronary artery bypass graft(s) without angina pectoris: Secondary | ICD-10-CM | POA: Diagnosis not present

## 2018-12-15 DIAGNOSIS — I255 Ischemic cardiomyopathy: Secondary | ICD-10-CM | POA: Diagnosis not present

## 2018-12-15 DIAGNOSIS — E039 Hypothyroidism, unspecified: Secondary | ICD-10-CM | POA: Diagnosis not present

## 2018-12-23 DIAGNOSIS — I5022 Chronic systolic (congestive) heart failure: Secondary | ICD-10-CM | POA: Diagnosis not present

## 2018-12-23 DIAGNOSIS — I472 Ventricular tachycardia: Secondary | ICD-10-CM | POA: Diagnosis not present

## 2018-12-23 DIAGNOSIS — Z794 Long term (current) use of insulin: Secondary | ICD-10-CM | POA: Diagnosis not present

## 2018-12-23 DIAGNOSIS — I10 Essential (primary) hypertension: Secondary | ICD-10-CM | POA: Diagnosis not present

## 2018-12-23 DIAGNOSIS — Z0000 Encounter for general adult medical examination without abnormal findings: Secondary | ICD-10-CM | POA: Diagnosis not present

## 2018-12-23 DIAGNOSIS — Z95810 Presence of automatic (implantable) cardiac defibrillator: Secondary | ICD-10-CM | POA: Diagnosis not present

## 2018-12-23 DIAGNOSIS — E1140 Type 2 diabetes mellitus with diabetic neuropathy, unspecified: Secondary | ICD-10-CM | POA: Diagnosis not present

## 2018-12-23 DIAGNOSIS — I255 Ischemic cardiomyopathy: Secondary | ICD-10-CM | POA: Diagnosis not present

## 2018-12-23 DIAGNOSIS — I25810 Atherosclerosis of coronary artery bypass graft(s) without angina pectoris: Secondary | ICD-10-CM | POA: Diagnosis not present

## 2019-01-14 DIAGNOSIS — Z7901 Long term (current) use of anticoagulants: Secondary | ICD-10-CM | POA: Diagnosis not present

## 2019-01-26 DIAGNOSIS — I10 Essential (primary) hypertension: Secondary | ICD-10-CM | POA: Diagnosis not present

## 2019-01-26 DIAGNOSIS — I25810 Atherosclerosis of coronary artery bypass graft(s) without angina pectoris: Secondary | ICD-10-CM | POA: Diagnosis not present

## 2019-01-26 DIAGNOSIS — I480 Paroxysmal atrial fibrillation: Secondary | ICD-10-CM | POA: Diagnosis not present

## 2019-01-26 DIAGNOSIS — R0602 Shortness of breath: Secondary | ICD-10-CM | POA: Diagnosis not present

## 2019-02-04 DIAGNOSIS — R0602 Shortness of breath: Secondary | ICD-10-CM | POA: Diagnosis not present

## 2019-02-12 DIAGNOSIS — I10 Essential (primary) hypertension: Secondary | ICD-10-CM | POA: Diagnosis not present

## 2019-02-12 DIAGNOSIS — I340 Nonrheumatic mitral (valve) insufficiency: Secondary | ICD-10-CM | POA: Diagnosis not present

## 2019-02-12 DIAGNOSIS — I255 Ischemic cardiomyopathy: Secondary | ICD-10-CM | POA: Diagnosis not present

## 2019-02-12 DIAGNOSIS — I480 Paroxysmal atrial fibrillation: Secondary | ICD-10-CM | POA: Diagnosis not present

## 2019-02-12 DIAGNOSIS — I5022 Chronic systolic (congestive) heart failure: Secondary | ICD-10-CM | POA: Diagnosis not present

## 2019-02-12 DIAGNOSIS — I472 Ventricular tachycardia: Secondary | ICD-10-CM | POA: Diagnosis not present

## 2019-02-12 DIAGNOSIS — E782 Mixed hyperlipidemia: Secondary | ICD-10-CM | POA: Diagnosis not present

## 2019-02-12 DIAGNOSIS — I25810 Atherosclerosis of coronary artery bypass graft(s) without angina pectoris: Secondary | ICD-10-CM | POA: Diagnosis not present

## 2019-02-16 DIAGNOSIS — Z7901 Long term (current) use of anticoagulants: Secondary | ICD-10-CM | POA: Diagnosis not present

## 2019-02-16 DIAGNOSIS — M542 Cervicalgia: Secondary | ICD-10-CM | POA: Diagnosis not present

## 2019-02-16 DIAGNOSIS — M5412 Radiculopathy, cervical region: Secondary | ICD-10-CM | POA: Diagnosis not present

## 2019-02-16 DIAGNOSIS — M5030 Other cervical disc degeneration, unspecified cervical region: Secondary | ICD-10-CM | POA: Diagnosis not present

## 2019-03-17 ENCOUNTER — Other Ambulatory Visit
Admission: RE | Admit: 2019-03-17 | Discharge: 2019-03-17 | Disposition: A | Discharge status: Home / Self Care | Payer: Medicare HMO | Source: Ambulatory Visit | Attending: Internal Medicine | Admitting: Internal Medicine

## 2019-03-17 ENCOUNTER — Other Ambulatory Visit: Payer: Self-pay

## 2019-03-17 DIAGNOSIS — Z20828 Contact with and (suspected) exposure to other viral communicable diseases: Secondary | ICD-10-CM | POA: Diagnosis not present

## 2019-03-17 DIAGNOSIS — Z01812 Encounter for preprocedural laboratory examination: Secondary | ICD-10-CM | POA: Diagnosis not present

## 2019-03-17 LAB — SARS CORONAVIRUS 2 (TAT 6-24 HRS): SARS Coronavirus 2: NEGATIVE

## 2019-03-19 ENCOUNTER — Encounter: Payer: Self-pay | Admitting: *Deleted

## 2019-03-20 ENCOUNTER — Ambulatory Visit
Admission: RE | Admit: 2019-03-20 | Discharge: 2019-03-20 | Disposition: A | Discharge status: Home / Self Care | Payer: Medicare HMO | Attending: Internal Medicine | Admitting: Internal Medicine

## 2019-03-20 ENCOUNTER — Other Ambulatory Visit: Payer: Self-pay

## 2019-03-20 ENCOUNTER — Encounter
Admission: RE | Disposition: A | Discharge status: Home / Self Care | Payer: Self-pay | Source: Home / Self Care | Attending: Internal Medicine

## 2019-03-20 ENCOUNTER — Ambulatory Visit: Payer: Medicare HMO | Admitting: Anesthesiology

## 2019-03-20 ENCOUNTER — Encounter: Payer: Self-pay | Admitting: Internal Medicine

## 2019-03-20 DIAGNOSIS — E785 Hyperlipidemia, unspecified: Secondary | ICD-10-CM | POA: Insufficient documentation

## 2019-03-20 DIAGNOSIS — R1310 Dysphagia, unspecified: Secondary | ICD-10-CM | POA: Diagnosis not present

## 2019-03-20 DIAGNOSIS — K317 Polyp of stomach and duodenum: Secondary | ICD-10-CM | POA: Diagnosis not present

## 2019-03-20 DIAGNOSIS — I4891 Unspecified atrial fibrillation: Secondary | ICD-10-CM | POA: Diagnosis not present

## 2019-03-20 DIAGNOSIS — Z95810 Presence of automatic (implantable) cardiac defibrillator: Secondary | ICD-10-CM | POA: Insufficient documentation

## 2019-03-20 DIAGNOSIS — I509 Heart failure, unspecified: Secondary | ICD-10-CM | POA: Diagnosis not present

## 2019-03-20 DIAGNOSIS — Z7951 Long term (current) use of inhaled steroids: Secondary | ICD-10-CM | POA: Diagnosis not present

## 2019-03-20 DIAGNOSIS — K449 Diaphragmatic hernia without obstruction or gangrene: Secondary | ICD-10-CM | POA: Insufficient documentation

## 2019-03-20 DIAGNOSIS — I2510 Atherosclerotic heart disease of native coronary artery without angina pectoris: Secondary | ICD-10-CM | POA: Insufficient documentation

## 2019-03-20 DIAGNOSIS — K648 Other hemorrhoids: Secondary | ICD-10-CM | POA: Diagnosis not present

## 2019-03-20 DIAGNOSIS — D126 Benign neoplasm of colon, unspecified: Secondary | ICD-10-CM | POA: Diagnosis not present

## 2019-03-20 DIAGNOSIS — Z86010 Personal history of colonic polyps: Secondary | ICD-10-CM | POA: Insufficient documentation

## 2019-03-20 DIAGNOSIS — Z87442 Personal history of urinary calculi: Secondary | ICD-10-CM | POA: Insufficient documentation

## 2019-03-20 DIAGNOSIS — N400 Enlarged prostate without lower urinary tract symptoms: Secondary | ICD-10-CM | POA: Diagnosis not present

## 2019-03-20 DIAGNOSIS — Z794 Long term (current) use of insulin: Secondary | ICD-10-CM | POA: Diagnosis not present

## 2019-03-20 DIAGNOSIS — I110 Hypertensive heart disease with heart failure: Secondary | ICD-10-CM | POA: Diagnosis not present

## 2019-03-20 DIAGNOSIS — E119 Type 2 diabetes mellitus without complications: Secondary | ICD-10-CM | POA: Diagnosis not present

## 2019-03-20 DIAGNOSIS — I252 Old myocardial infarction: Secondary | ICD-10-CM | POA: Diagnosis not present

## 2019-03-20 DIAGNOSIS — Z79890 Hormone replacement therapy (postmenopausal): Secondary | ICD-10-CM | POA: Insufficient documentation

## 2019-03-20 DIAGNOSIS — Z7901 Long term (current) use of anticoagulants: Secondary | ICD-10-CM | POA: Diagnosis not present

## 2019-03-20 DIAGNOSIS — D122 Benign neoplasm of ascending colon: Secondary | ICD-10-CM | POA: Diagnosis not present

## 2019-03-20 DIAGNOSIS — D120 Benign neoplasm of cecum: Secondary | ICD-10-CM | POA: Diagnosis not present

## 2019-03-20 DIAGNOSIS — D131 Benign neoplasm of stomach: Secondary | ICD-10-CM | POA: Diagnosis not present

## 2019-03-20 DIAGNOSIS — F419 Anxiety disorder, unspecified: Secondary | ICD-10-CM | POA: Insufficient documentation

## 2019-03-20 DIAGNOSIS — E039 Hypothyroidism, unspecified: Secondary | ICD-10-CM | POA: Diagnosis not present

## 2019-03-20 DIAGNOSIS — R109 Unspecified abdominal pain: Secondary | ICD-10-CM | POA: Diagnosis not present

## 2019-03-20 DIAGNOSIS — R12 Heartburn: Secondary | ICD-10-CM | POA: Diagnosis not present

## 2019-03-20 DIAGNOSIS — R1013 Epigastric pain: Secondary | ICD-10-CM | POA: Diagnosis not present

## 2019-03-20 DIAGNOSIS — K635 Polyp of colon: Secondary | ICD-10-CM | POA: Diagnosis not present

## 2019-03-20 DIAGNOSIS — K640 First degree hemorrhoids: Secondary | ICD-10-CM | POA: Insufficient documentation

## 2019-03-20 DIAGNOSIS — Z79899 Other long term (current) drug therapy: Secondary | ICD-10-CM | POA: Insufficient documentation

## 2019-03-20 DIAGNOSIS — I255 Ischemic cardiomyopathy: Secondary | ICD-10-CM | POA: Insufficient documentation

## 2019-03-20 HISTORY — PX: COLONOSCOPY WITH PROPOFOL: SHX5780

## 2019-03-20 HISTORY — PX: ESOPHAGOGASTRODUODENOSCOPY (EGD) WITH PROPOFOL: SHX5813

## 2019-03-20 LAB — GLUCOSE, CAPILLARY: Glucose-Capillary: 78 mg/dL (ref 70–99)

## 2019-03-20 SURGERY — COLONOSCOPY WITH PROPOFOL
Anesthesia: General

## 2019-03-20 MED ORDER — PROPOFOL 500 MG/50ML IV EMUL
INTRAVENOUS | Status: AC
  Filled 2019-03-20: qty 50

## 2019-03-20 MED ORDER — SODIUM CHLORIDE 0.9 % IV SOLN
INTRAVENOUS | Status: DC | PRN
  Administered 2019-03-20: 14:00:00 via INTRAVENOUS

## 2019-03-20 MED ORDER — PHENYLEPHRINE HCL (PRESSORS) 10 MG/ML IV SOLN
INTRAVENOUS | Status: DC | PRN
  Administered 2019-03-20 (×2): 50 ug via INTRAVENOUS

## 2019-03-20 MED ORDER — PROPOFOL 500 MG/50ML IV EMUL
INTRAVENOUS | Status: DC | PRN
  Administered 2019-03-20: 50 ug/kg/min via INTRAVENOUS

## 2019-03-20 MED ORDER — PROPOFOL 10 MG/ML IV BOLUS
INTRAVENOUS | Status: DC | PRN
  Administered 2019-03-20: 30 mg via INTRAVENOUS
  Administered 2019-03-20: 40 mg via INTRAVENOUS
  Administered 2019-03-20: 30 mg via INTRAVENOUS

## 2019-03-20 NOTE — Transfer of Care (Signed)
Immediate Anesthesia Transfer of Care Note  Patient: Dakota Thomas  Procedure(s) Performed: COLONOSCOPY WITH PROPOFOL (N/A ) ESOPHAGOGASTRODUODENOSCOPY (EGD) WITH PROPOFOL (N/A )  Patient Location: PACU  Anesthesia Type:General  Level of Consciousness: sedated  Airway & Oxygen Therapy: Patient Spontanous Breathing and Patient connected to face mask oxygen  Post-op Assessment: Report given to RN and Post -op Vital signs reviewed and stable  Post vital signs: Reviewed and stable  Last Vitals:  Vitals Value Taken Time  BP    Temp    Pulse 56 03/20/19 1500  Resp 15 03/20/19 1500  SpO2 98 % 03/20/19 1500  Vitals shown include unvalidated device data.  Last Pain:  Vitals:   03/20/19 1310  TempSrc: Tympanic         Complications: No apparent anesthesia complications

## 2019-03-20 NOTE — Op Note (Signed)
[facility] Gastroenterology Patient Name: Dakota Thomas Procedure Date: 03/20/2019 1:56 PM MRN: [id number] Account #: [id number] Date of Birth: 19-Nov-1933 Admit Type: Outpatient Age: 85 Room: [facility] ENDO ROOM 4 Gender: Male Note Status: Finalized Procedure:            Colonoscopy Indications:          High risk colon cancer surveillance: Personal history                        of colonic polyps Providers:            Benay Pike. Alice Reichert MD, MD Referring MD:         Tracie Harrier, MD (Referring MD) Medicines:            Propofol per Anesthesia Complications:        No immediate complications. Procedure:            Pre-Anesthesia Assessment:                       - The risks and benefits of the procedure and the                        sedation options and risks were discussed with the                        patient. All questions were answered and informed                        consent was obtained.                       - Patient identification and proposed procedure were                        verified prior to the procedure by the nurse. The                        procedure was verified in the procedure room.                       - ASA Grade Assessment: III - A patient with severe                        systemic disease.                       - After reviewing the risks and benefits, the patient                        was deemed in satisfactory condition to undergo the                        procedure.                       After obtaining informed consent, the colonoscope was                        passed under direct vision. Throughout the procedure,  the patient's blood pressure, pulse, and oxygen                        saturations were monitored continuously. The                        Colonoscope was introduced through the anus and                        advanced to the the cecum, identified by appendiceal   orifice and ileocecal valve. The colonoscopy was                        performed without difficulty. The patient tolerated the                        procedure well. The quality of the bowel preparation                        was good. The ileocecal valve, appendiceal orifice, and                        rectum were photographed. Findings:      The perianal and digital rectal examinations were normal. Pertinent       negatives include normal sphincter tone and no palpable rectal lesions.      Three sessile polyps were found in the ascending colon and cecum. The       polyps were 6 to 9 mm in size. These polyps were removed with a cold       snare. Resection and retrieval were complete.      Non-bleeding internal hemorrhoids were found during retroflexion. The       hemorrhoids were Grade I (internal hemorrhoids that do not prolapse).      The exam was otherwise without abnormality. Impression:           - Three 6 to 9 mm polyps in the ascending colon and in                        the cecum, removed with a cold snare. Resected and                        retrieved.                       - Non-bleeding internal hemorrhoids.                       - The examination was otherwise normal. Recommendation:       - Patient has a contact number available for                        emergencies. The signs and symptoms of potential                        delayed complications were discussed with the patient.                        Return to normal activities tomorrow. Written discharge  instructions were provided to the patient.                       - Resume previous diet.                       - Continue present medications.                       - No recommendation at this time regarding repeat                        colonoscopy due to age.                       - Resume Coumadin (warfarin) at prior dose today. Refer                        to managing physician for further  adjustment of therapy. Procedure Code(s):    --- Professional ---                       [phone], Colonoscopy, flexible; with removal of tumor(s),                        polyp(s), or other lesion(s) by snare technique Diagnosis Code(s):    --- Professional ---                       K64.0, First degree hemorrhoids                       K63.5, Polyp of colon                       Z86.010, Personal history of colonic polyps CPT copyright 2019 [employer]. All rights reserved. The codes documented in this report are preliminary and upon coder review may  be revised to meet current compliance requirements. Efrain Sella MD, MD 03/20/2019 2:57:42 PM This report has been signed electronically. Number of Addenda: 0 Note Initiated On: 03/20/2019 1:56 PM Scope Withdrawal Time: 0 hours 12 minutes 8 seconds  Total Procedure Duration: 0 hours 14 minutes 51 seconds  Estimated Blood Loss: Estimated blood loss: none.      [facility]

## 2019-03-20 NOTE — Op Note (Signed)
[facility] Gastroenterology Patient Name: Dakota Thomas Procedure Date: 03/20/2019 1:56 PM MRN: [id number] Account #: [id number] Date of Birth: Oct 16, 1933 Admit Type: Ambulatory Age: 85 Room: [facility] ENDO ROOM 4 Gender: Male Note Status: Finalized Procedure:            Upper GI endoscopy Indications:          Epigastric abdominal pain, Dysphagia, Heartburn Providers:            Benay Pike. Alice Reichert MD, MD Referring MD:         Tracie Harrier, MD (Referring MD) Medicines:            Propofol per Anesthesia Complications:        No immediate complications. Procedure:            Pre-Anesthesia Assessment:                       - The risks and benefits of the procedure and the                        sedation options and risks were discussed with the                        patient. All questions were answered and informed                        consent was obtained.                       - Patient identification and proposed procedure were                        verified prior to the procedure by the nurse. The                        procedure was verified in the procedure room.                       - ASA Grade Assessment: III - A patient with severe                        systemic disease.                       - After reviewing the risks and benefits, the patient                        was deemed in satisfactory condition to undergo the                        procedure.                       After obtaining informed consent, the endoscope was                        passed under direct vision. Throughout the procedure,                        the patient's blood pressure, pulse, and oxygen  saturations were monitored continuously. The Endoscope                        was introduced through the mouth, and advanced to the                        third part of duodenum. The upper GI endoscopy was                        accomplished without difficulty.  The patient tolerated                        the procedure well. Findings:      The examined esophagus was normal.      A 1 cm hiatal hernia was present.      A single 9 mm pedunculated polyp with no bleeding and no stigmata of       recent bleeding was found in the prepyloric region of the stomach. To       prevent bleeding after the polypectomy, one hemostatic clip was       successfully placed (MR conditional). There was no bleeding during, or       at the end, of the procedure. The polyp was removed with a cold snare.       Resection and retrieval were complete.      A single 6 mm semi-sessile polyp with no bleeding and no stigmata of       recent bleeding was found in the cardia. The polyp was removed with a       cold snare. Resection and retrieval were complete.      A single 3 mm sessile polyp with no bleeding and no stigmata of recent       bleeding was found in the cardia. The polyp was removed with a cold       biopsy forceps. Resection and retrieval were complete.      The examined duodenum was normal.      The exam was otherwise without abnormality. Impression:           - Normal esophagus.                       - 1 cm hiatal hernia.                       - A single gastric polyp. Resected and retrieved. Clip                        (MR conditional) was placed.                       - A single gastric polyp. Resected and retrieved.                       - A single gastric polyp. Resected and retrieved.                       - Normal examined duodenum.                       - The examination was otherwise normal. Recommendation:       - Await pathology results.                       -  Proceed with colonoscopy Procedure Code(s):    --- Professional ---                       [phone], Esophagogastroduodenoscopy, flexible, transoral;                        with removal of tumor(s), polyp(s), or other lesion(s)                        by snare technique                        [id number], 47, Esophagogastroduodenoscopy, flexible,                        transoral; with biopsy, single or multiple Diagnosis Code(s):    --- Professional ---                       R12, Heartburn                       R13.10, Dysphagia, unspecified                       R10.13, Epigastric pain                       K31.7, Polyp of stomach and duodenum                       K44.9, Diaphragmatic hernia without obstruction or                        gangrene CPT copyright 2019 American Medical Association. All rights reserved. The codes documented in this report are preliminary and upon coder review may  be revised to meet current compliance requirements. Efrain Sella MD, MD 03/20/2019 2:38:18 PM This report has been signed electronically. Number of Addenda: 0 Note Initiated On: 03/20/2019 1:56 PM Estimated Blood Loss: Estimated blood loss was minimal.      [facility]

## 2019-03-20 NOTE — Progress Notes (Signed)
Blood Sugar 78 - MD aware Patient without c/o -

## 2019-03-20 NOTE — Anesthesia Post-op Follow-up Note (Signed)
Anesthesia QCDR form completed.        

## 2019-03-20 NOTE — Anesthesia Preprocedure Evaluation (Addendum)
Anesthesia Evaluation  Patient identified by MRN, date of birth, ID band Patient awake    Reviewed: Allergy & Precautions, H&P , NPO status , Patient's Chart, lab work & pertinent test results  Airway Mallampati: II  TM Distance: >3 FB     Dental   Pulmonary neg pulmonary ROS, neg shortness of breath, neg COPD, neg recent URI, former smoker,           Cardiovascular hypertension, + CAD, + Past MI and +CHF  + dysrhythmias Atrial Fibrillation + pacemaker + Cardiac Defibrillator      Neuro/Psych Anxiety negative neurological ROS  negative psych ROS   GI/Hepatic negative GI ROS, Neg liver ROS,   Endo/Other  diabetesHypothyroidism   Renal/GU Stones   negative genitourinary   Musculoskeletal   Abdominal   Peds  Hematology negative hematology ROS (+)   Anesthesia Other Findings Past Medical History: No date: A-fib ([place]) No date: AICD (automatic cardioverter/defibrillator) present No date: Allergic state No date: Anxiety No date: Arrhythmia     Comment:  atrial fib No date: BPH (benign prostatic hyperplasia) No date: CAD (coronary artery disease) No date: Cancer (HCC)     Comment:  skin of the nose and arm No date: CHF (congestive heart failure) (HCC) No date: Diabetes mellitus without complication (HCC) No date: Family history of adverse reaction to anesthesia     Comment:  daughter sleeps a long time No date: H/O diverticulitis of colon 1985, 1995,2000, 2004, : Heart attack ([place])     Comment:  multiple MI's No date: Heart disease No date: Heartburn No date: History of kidney stones No date: History of stomach ulcers No date: HLD (hyperlipidemia) No date: Hypertension No date: Hypothyroidism No date: Ischemic cardiomyopathy No date: Kidney stones No date: Neuropathy No date: Pacemaker No date: Post herpetic neuralgia     Comment:  left side of face No date: Renal disorder     Comment:  UTI's No date:  Rosacea No date: Testosterone deficiency No date: Tremors of nervous system No date: Trigeminal neuralgia of left side of face  Past Surgical History: No date: APPENDECTOMY No date: BACK SURGERY     Comment:  cervical diskectomy No date: CHOLECYSTECTOMY 01/27/2016: COLONOSCOPY WITH PROPOFOL; N/A     Comment:  Procedure: COLONOSCOPY WITH PROPOFOL;  Surgeon: Manya Silvas, MD;  Location: [facility] ENDOSCOPY;  Service:               Endoscopy;  Laterality: N/A; No date: CORONARY ANGIOPLASTY WITH STENT PLACEMENT     Comment:  multiple times No date: CORONARY ARTERY BYPASS GRAFT No date: eyelids 03/19/2017: GREEN LIGHT LASER TURP (TRANSURETHRAL RESECTION OF  PROSTATE; N/A     Comment:  Procedure: GREEN LIGHT LASER TURP (TRANSURETHRAL               RESECTION OF PROSTATE;  Surgeon: Royston Cowper, MD;                Location: [facility] ORS;  Service: Urology;  Laterality: N/A; 05/30/2016: IMPLANTABLE CARDIOVERTER DEFIBRILLATOR (ICD) GENERATOR  CHANGE; Left     Comment:  Procedure: ICD GENERATOR CHANGE;  Surgeon: Isaias Cowman, MD;  Location: [facility] ORS;  Service:               Cardiovascular;  Laterality: Left; No date: kidney stones No date: NASAL SEPTUM  SURGERY No date: ostate surgery No date: PACEMAKER INSERTION No date: prostate heating     Comment:  seven years ago 11/28/2015: TEE WITHOUT CARDIOVERSION; N/A     Comment:  Procedure: Transesophageal Echocardiogram (Tee);                Surgeon: Teodoro Spray, MD;  Location: [facility] ORS;                Service: Cardiovascular;  Laterality: N/A; No date: THYROID SURGERY No date: TONSILLECTOMY     Comment:  x 2  BMI    Body Mass Index: 26.52 kg/m      Reproductive/Obstetrics negative OB ROS                            Anesthesia Physical Anesthesia Plan  ASA: III  Anesthesia Plan: General   Post-op Pain Management:    Induction:   PONV Risk Score and Plan: Propofol  infusion and TIVA  Airway Management Planned: Natural Airway and Nasal Cannula  Additional Equipment:   Intra-op Plan:   Post-operative Plan:   Informed Consent: I have reviewed the patients History and Physical, chart, labs and discussed the procedure including the risks, benefits and alternatives for the proposed anesthesia with the patient or authorized representative who has indicated his/her understanding and acceptance.     Dental Advisory Given  Plan Discussed with: Anesthesiologist and CRNA  Anesthesia Plan Comments:         Anesthesia Quick Evaluation

## 2019-03-20 NOTE — H&P (Signed)
Outpatient short stay form Pre-procedure 03/20/2019 1:04 PM Dakota Thomas, M.D.  Primary Physician:  Tracie Harrier, M.D.   History of present illness:  Tubular adenoma, Dysphagia, epigastric pain.   No current facility-administered medications for this encounter.   Medications Prior to Admission  Medication Sig Dispense Refill Last Dose  . acetaminophen (TYLENOL) 500 MG tablet Take 500-1,000 mg by mouth every 6 (six) hours as needed for mild pain.    Past Week at Unknown time  . ALPRAZolam (XANAX) 0.5 MG tablet Take 0.5 mg by mouth 2 (two) times daily.    03/20/2019 at Unknown time  . amiodarone (PACERONE) 200 MG tablet Take 100 mg by mouth daily.   03/20/2019 at Unknown time  . atorvastatin (LIPITOR) 40 MG tablet Take 40 mg by mouth every evening.    03/19/2019 at Unknown time  . budesonide-formoterol (SYMBICORT) 160-4.5 MCG/ACT inhaler Inhale 2 puffs into the lungs 2 (two) times daily.   03/20/2019 at Unknown time  . gabapentin (NEURONTIN) 600 MG tablet Take 600 mg by mouth 2 (two) times daily.    03/20/2019 at Unknown time  . HYDROcodone-acetaminophen (NORCO/VICODIN) 5-325 MG tablet Take 1 tablet by mouth daily as needed (pain).    Past Month at Unknown time  . insulin aspart (NOVOLOG) 100 UNIT/ML FlexPen Inject 0-6 Units into the skin 2 (two) times daily before a meal. Dose per sliding scale   Past Week at Unknown time  . insulin glargine (LANTUS) 100 unit/mL SOPN Inject 25 Units into the skin at bedtime.    03/19/2019 at Unknown time  . levothyroxine (SYNTHROID, LEVOTHROID) 100 MCG tablet Take 100 mcg by mouth daily before breakfast.   03/20/2019 at Unknown time  . loperamide (IMODIUM) 2 MG capsule Take 2 mg by mouth as needed for diarrhea or loose stools. Max 8 capsules per day   Past Month at Unknown time  . metoprolol succinate (TOPROL-XL) 25 MG 24 hr tablet Take 25 mg by mouth daily.    03/20/2019 at Unknown time  . omeprazole (PRILOSEC) 20 MG capsule Take 20 mg by mouth See admin  instructions. Takes 20mg  every morning. Takes an additional 20mg  in the evening as needed for acid reflux   03/20/2019 at Unknown time  . Potassium Gluconate 550 (90 K) MG TABS Take 90 mg by mouth every other day.   03/19/2019 at Unknown time  . spironolactone (ALDACTONE) 25 MG tablet Take 25 mg by mouth daily.    03/20/2019 at Unknown time  . testosterone cypionate (DEPOTESTOSTERONE CYPIONATE) 200 MG/ML injection INJECT 200mg (1 ML) IN THE MUSCLE EVERY 28 DAYS   Past Month at Unknown time  . warfarin (COUMADIN) 2.5 MG tablet Take 2.5 mg by mouth daily.   Past Week at Unknown time  . albuterol (VENTOLIN HFA) 108 (90 Base) MCG/ACT inhaler Inhale 2 puffs into the lungs every 6 (six) hours as needed for wheezing or shortness of breath.   Not Taking at Unknown time  . ASPERCREME W/LIDOCAINE EX Apply 1 application topically daily as needed (back pain).     Marland Kitchen docusate sodium (COLACE) 100 MG capsule Take 2 capsules (200 mg total) by mouth 2 (two) times daily. (Patient not taking: Reported on 03/20/2019) 120 capsule 3 Not Taking at Unknown time  . Multiple Vitamin (MULTIVITAMIN WITH MINERALS) TABS tablet Take 1 tablet by mouth daily.   Not Taking at Unknown time  . nitrofurantoin (MACRODANTIN) 100 MG capsule Take 1 capsule (100 mg total) by mouth 2 (two) times daily. [place]  capsule 0   . ondansetron (ZOFRAN ODT) 8 MG disintegrating tablet Take 0.5 tablets (4 mg total) by mouth every 6 (six) hours as needed for nausea or vomiting. (Patient not taking: Reported on 03/20/2019) 4 tablet 3 Not Taking at Unknown time  . pyridOXINE (B-6) 50 MG tablet Take 100 mg by mouth daily.    Not Taking at Unknown time  . tamsulosin (FLOMAX) 0.4 MG CAPS capsule Take 2 capsules (0.8 mg total) by mouth daily. (Patient not taking: Reported on 03/20/2019) 180 capsule 3 Not Taking at Unknown time  . warfarin (COUMADIN) 2 MG tablet Take 2 mg by mouth as directed.   Not Taking at Unknown time     Allergies  Allergen Reactions  . Levaquin  [Levofloxacin] Swelling    Swelling of throat   . Ciprofloxacin Other (See Comments)    Makes him feel horrible, flu-like symptoms  . Lidocaine Other (See Comments)    IV infusion "caused problems"  . Metformin Other (See Comments)    Esophageal Spasms  . Tannic Acid Hives  . Tamsulosin Hcl Other (See Comments)    Cough, stomach pains, low blood pressure     Past Medical History:  Diagnosis Date  . A-fib ([place])   . AICD (automatic cardioverter/defibrillator) present   . Allergic state   . Anxiety   . Arrhythmia    atrial fib  . BPH (benign prostatic hyperplasia)   . CAD (coronary artery disease)   . Cancer (HCC)    skin of the nose and arm  . CHF (congestive heart failure) ([place])   . Diabetes mellitus without complication ([place])   . Family history of adverse reaction to anesthesia    daughter sleeps a long time  . H/O diverticulitis of colon   . Heart attack ([place]) 1985, 1995,2000, 2004,    multiple MI's  . Heart disease   . Heartburn   . History of kidney stones   . History of stomach ulcers   . HLD (hyperlipidemia)   . Hypertension   . Hypothyroidism   . Ischemic cardiomyopathy   . Kidney stones   . Neuropathy   . Pacemaker   . Post herpetic neuralgia    left side of face  . Renal disorder    UTI's  . Rosacea   . Testosterone deficiency   . Tremors of nervous system   . Trigeminal neuralgia of left side of face     Review of systems:  Otherwise negative.    Physical Exam  Gen: Alert, oriented. Appears stated age.  HEENT: [place]/AT. PERRLA. Lungs: CTA, no wheezes. CV: RR nl S1, S2. Abd: soft, benign, no masses. BS+ Ext: No edema. Pulses 2+    Planned procedures: Proceed with EGD and colonoscopy. The patient understands the nature of the planned procedure, indications, risks, alternatives and potential complications including but not limited to bleeding, infection, perforation, damage to internal organs and possible oversedation/side effects from  anesthesia. The patient agrees and gives consent to proceed.  Please refer to procedure notes for findings, recommendations and patient disposition/instructions.     Dakota Thomas, M.D. Gastroenterology 03/20/2019  1:04 PM

## 2019-03-20 NOTE — Interval H&P Note (Signed)
History and Physical Interval Note:  03/20/2019 1:12 PM  Dakota Thomas  has presented today for surgery, with the diagnosis of [place] ABD PAIN.  The various methods of treatment have been discussed with the patient and family. After consideration of risks, benefits and other options for treatment, the patient has consented to  Procedure(s): COLONOSCOPY WITH PROPOFOL (N/A) ESOPHAGOGASTRODUODENOSCOPY (EGD) WITH PROPOFOL (N/A) as a surgical intervention.  The patient's history has been reviewed, patient examined, no change in status, stable for surgery.  I have reviewed the patient's chart and labs.  Questions were answered to the patient's satisfaction.     [place], [place]

## 2019-03-26 LAB — SURGICAL PATHOLOGY

## 2019-03-26 NOTE — Anesthesia Postprocedure Evaluation (Signed)
Anesthesia Post Note  Patient: Dakota Thomas  Procedure(s) Performed: COLONOSCOPY WITH PROPOFOL (N/A ) ESOPHAGOGASTRODUODENOSCOPY (EGD) WITH PROPOFOL (N/A )  Patient location during evaluation: PACU Anesthesia Type: General Level of consciousness: awake and alert Pain management: pain level controlled Vital Signs Assessment: post-procedure vital signs reviewed and stable Respiratory status: spontaneous breathing, nonlabored ventilation and respiratory function stable Cardiovascular status: blood pressure returned to baseline and stable Postop Assessment: no apparent nausea or vomiting Anesthetic complications: no     Last Vitals:  Vitals:   03/20/19 1519 03/20/19 1529  BP: 121/77 125/75  Pulse: (!) 56 (!) 58  Resp: 13 15  Temp:    SpO2: 99% 100%    Last Pain:  Vitals:   03/22/19 1313  TempSrc:   PainSc: 0-No pain                 Durenda Hurt

## 2019-03-30 DIAGNOSIS — Z7901 Long term (current) use of anticoagulants: Secondary | ICD-10-CM | POA: Diagnosis not present

## 2019-04-15 DIAGNOSIS — Z95810 Presence of automatic (implantable) cardiac defibrillator: Secondary | ICD-10-CM | POA: Diagnosis not present

## 2019-04-15 DIAGNOSIS — I472 Ventricular tachycardia: Secondary | ICD-10-CM | POA: Diagnosis not present

## 2019-04-15 DIAGNOSIS — I255 Ischemic cardiomyopathy: Secondary | ICD-10-CM | POA: Diagnosis not present

## 2019-04-15 DIAGNOSIS — I25810 Atherosclerosis of coronary artery bypass graft(s) without angina pectoris: Secondary | ICD-10-CM | POA: Diagnosis not present

## 2019-04-15 DIAGNOSIS — E1140 Type 2 diabetes mellitus with diabetic neuropathy, unspecified: Secondary | ICD-10-CM | POA: Diagnosis not present

## 2019-04-15 DIAGNOSIS — Z0000 Encounter for general adult medical examination without abnormal findings: Secondary | ICD-10-CM | POA: Diagnosis not present

## 2019-04-15 DIAGNOSIS — Z794 Long term (current) use of insulin: Secondary | ICD-10-CM | POA: Diagnosis not present

## 2019-04-15 DIAGNOSIS — I10 Essential (primary) hypertension: Secondary | ICD-10-CM | POA: Diagnosis not present

## 2019-04-15 DIAGNOSIS — I5022 Chronic systolic (congestive) heart failure: Secondary | ICD-10-CM | POA: Diagnosis not present

## 2019-04-22 DIAGNOSIS — B0222 Postherpetic trigeminal neuralgia: Secondary | ICD-10-CM | POA: Diagnosis not present

## 2019-04-22 DIAGNOSIS — Z794 Long term (current) use of insulin: Secondary | ICD-10-CM | POA: Diagnosis not present

## 2019-04-22 DIAGNOSIS — I480 Paroxysmal atrial fibrillation: Secondary | ICD-10-CM | POA: Diagnosis not present

## 2019-04-22 DIAGNOSIS — J449 Chronic obstructive pulmonary disease, unspecified: Secondary | ICD-10-CM | POA: Diagnosis not present

## 2019-04-22 DIAGNOSIS — E119 Type 2 diabetes mellitus without complications: Secondary | ICD-10-CM | POA: Diagnosis not present

## 2019-04-22 DIAGNOSIS — Z7951 Long term (current) use of inhaled steroids: Secondary | ICD-10-CM | POA: Diagnosis not present

## 2019-04-22 DIAGNOSIS — I10 Essential (primary) hypertension: Secondary | ICD-10-CM | POA: Diagnosis not present

## 2019-04-22 DIAGNOSIS — K219 Gastro-esophageal reflux disease without esophagitis: Secondary | ICD-10-CM | POA: Diagnosis not present

## 2019-04-22 DIAGNOSIS — I255 Ischemic cardiomyopathy: Secondary | ICD-10-CM | POA: Diagnosis not present

## 2019-04-28 DIAGNOSIS — H6123 Impacted cerumen, bilateral: Secondary | ICD-10-CM | POA: Diagnosis not present

## 2019-04-28 DIAGNOSIS — H8112 Benign paroxysmal vertigo, left ear: Secondary | ICD-10-CM | POA: Diagnosis not present

## 2019-04-28 DIAGNOSIS — H6063 Unspecified chronic otitis externa, bilateral: Secondary | ICD-10-CM | POA: Diagnosis not present

## 2019-04-29 DIAGNOSIS — E1140 Type 2 diabetes mellitus with diabetic neuropathy, unspecified: Secondary | ICD-10-CM | POA: Diagnosis not present

## 2019-04-29 DIAGNOSIS — Z7901 Long term (current) use of anticoagulants: Secondary | ICD-10-CM | POA: Diagnosis not present

## 2019-04-29 DIAGNOSIS — Z794 Long term (current) use of insulin: Secondary | ICD-10-CM | POA: Diagnosis not present

## 2019-05-05 DIAGNOSIS — E038 Other specified hypothyroidism: Secondary | ICD-10-CM | POA: Diagnosis not present

## 2019-05-05 DIAGNOSIS — N289 Disorder of kidney and ureter, unspecified: Secondary | ICD-10-CM | POA: Diagnosis not present

## 2019-05-05 DIAGNOSIS — I10 Essential (primary) hypertension: Secondary | ICD-10-CM | POA: Diagnosis not present

## 2019-05-05 DIAGNOSIS — E119 Type 2 diabetes mellitus without complications: Secondary | ICD-10-CM | POA: Diagnosis not present

## 2019-05-05 DIAGNOSIS — H8112 Benign paroxysmal vertigo, left ear: Secondary | ICD-10-CM | POA: Diagnosis not present

## 2019-05-12 ENCOUNTER — Other Ambulatory Visit: Payer: Self-pay

## 2019-05-12 DIAGNOSIS — R42 Dizziness and giddiness: Secondary | ICD-10-CM | POA: Diagnosis not present

## 2019-05-12 DIAGNOSIS — Z20822 Contact with and (suspected) exposure to covid-19: Secondary | ICD-10-CM

## 2019-05-12 DIAGNOSIS — H8112 Benign paroxysmal vertigo, left ear: Secondary | ICD-10-CM | POA: Diagnosis not present

## 2019-05-14 LAB — NOVEL CORONAVIRUS, NAA: SARS-CoV-2, NAA: NOT DETECTED

## 2019-05-20 DIAGNOSIS — H8112 Benign paroxysmal vertigo, left ear: Secondary | ICD-10-CM | POA: Diagnosis not present

## 2019-06-01 DIAGNOSIS — Z7901 Long term (current) use of anticoagulants: Secondary | ICD-10-CM | POA: Diagnosis not present

## 2019-06-21 IMAGING — CR DG FINGER MIDDLE 2+V*L*
3 series · 3 of 3 positions shown · non-contrast
Comparison: None.

CLINICAL DATA: Left middle finger injury after fall.

EXAM:
LEFT MIDDLE FINGER 2+V

[finger ap]
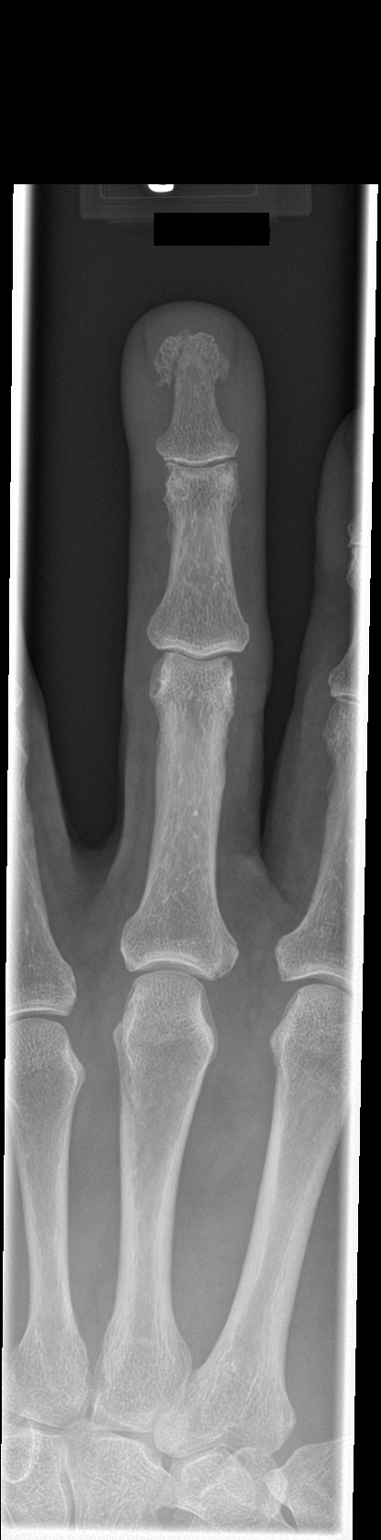

[finger obl]
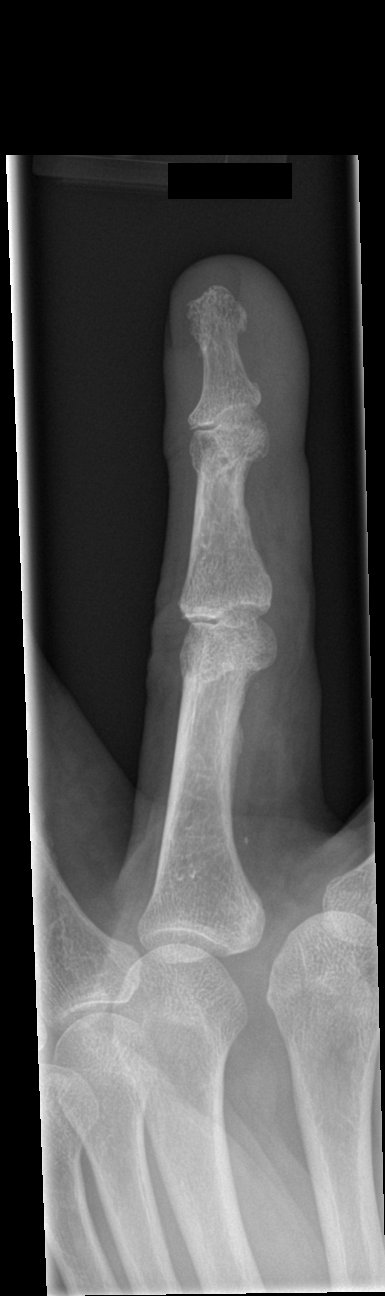

[finger lat]
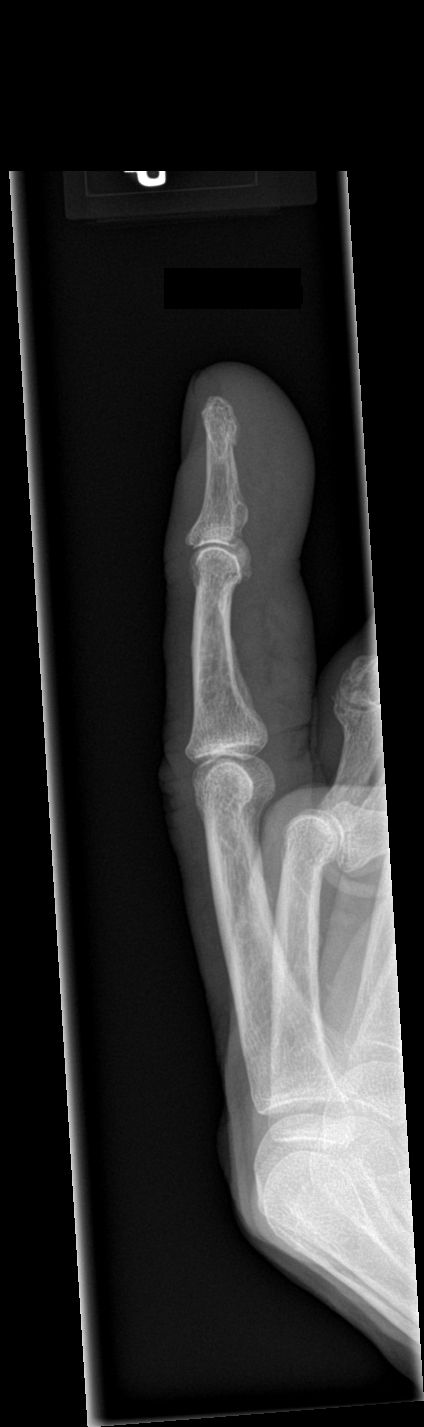

[3 of 3 positions shown; findings below may reference images not displayed]

FINDINGS: Mildly displaced fracture is seen involving the distal tuft of the
third distal phalanx. Joint spaces are intact. No other bony
abnormality is noted. Soft tissues are unremarkable.
IMPRESSION: Mildly displaced distal tuft fracture of third distal phalanx.

## 2019-06-21 IMAGING — CT CT HEAD W/O CM
3 series · 16 of 45 positions shown, 19 images · non-contrast
Comparison: CT 11/21/2016

CLINICAL DATA: Head trauma, fell out of bed

EXAM:
CT HEAD WITHOUT CONTRAST
TECHNIQUE: Contiguous axial images were obtained from the base of the skull
through the vertex without intravenous contrast.

[Series 3: head wo · axial · 0.40mm/px · z∈[-98,+17]mm · 10 of 28 slices shown, 13 images]
[im 3/28  brain]
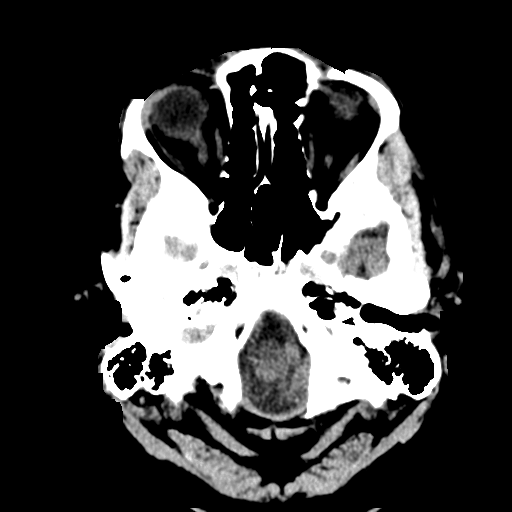
[im 3/28  bone]
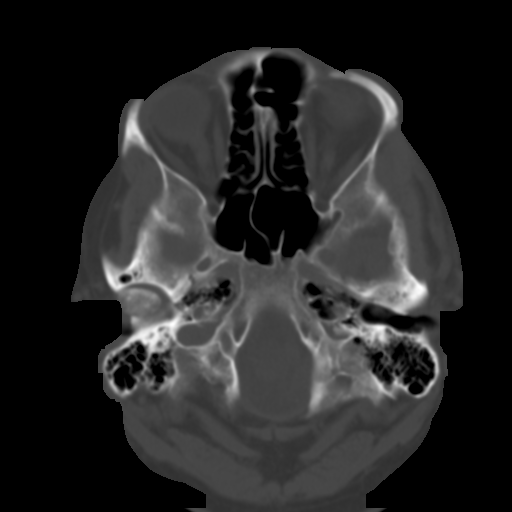
[im 5/28  brain]
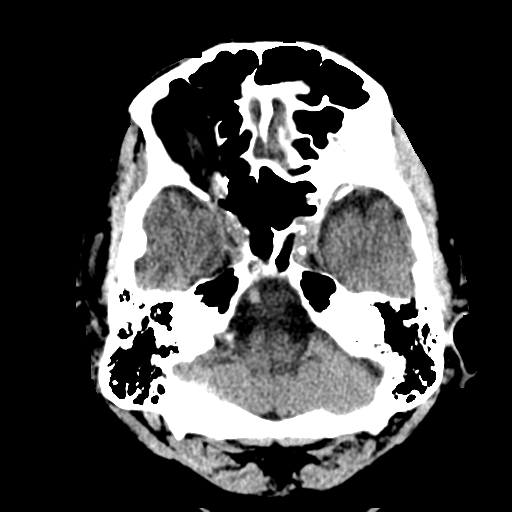
[im 8/28  brain]
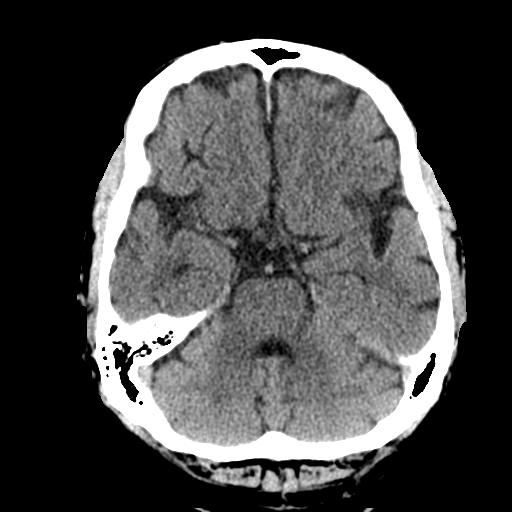
[im 11/28  brain]
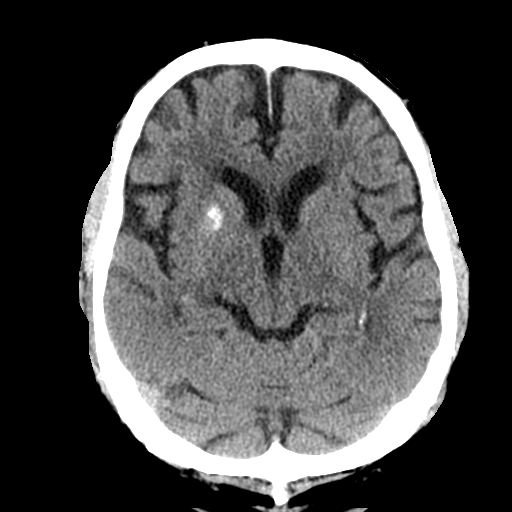
[im 13/28  brain]
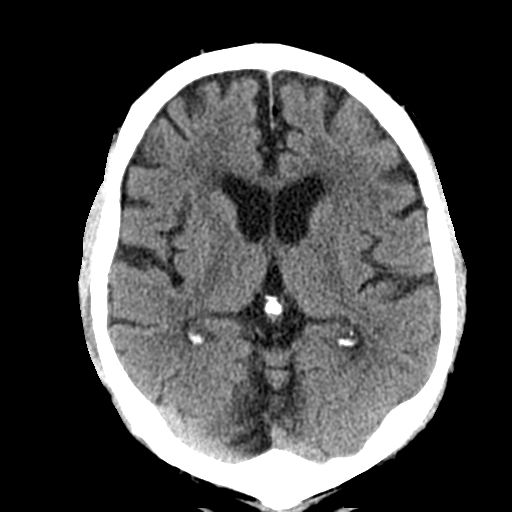
[im 13/28  bone]
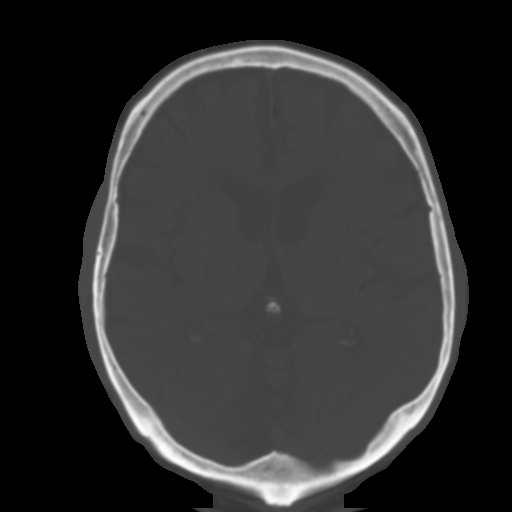
[im 16/28  brain]
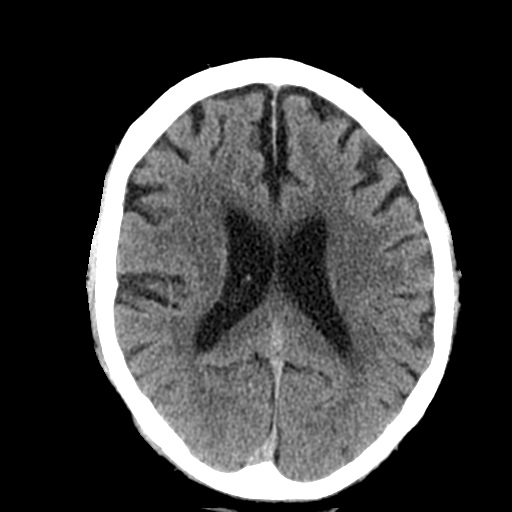
[im 18/28  brain]
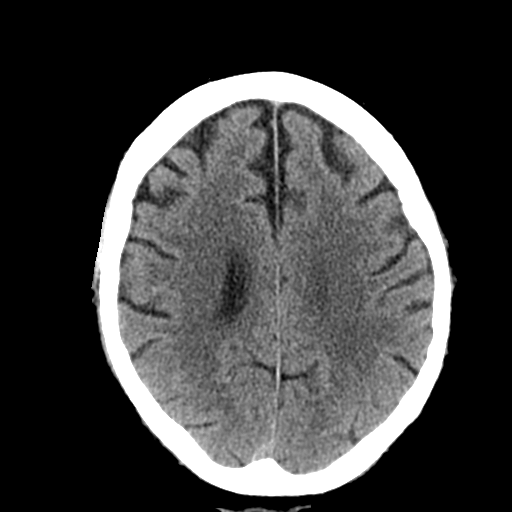
[im 21/28  brain]
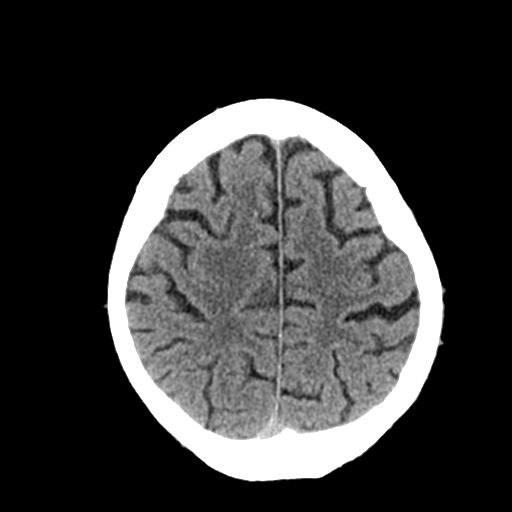
[im 24/28  brain]
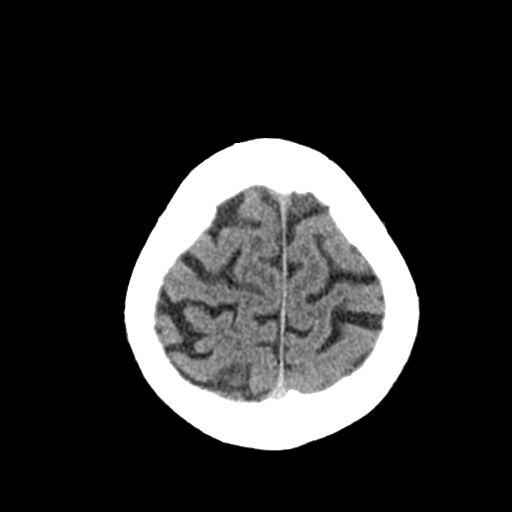
[im 24/28  bone]
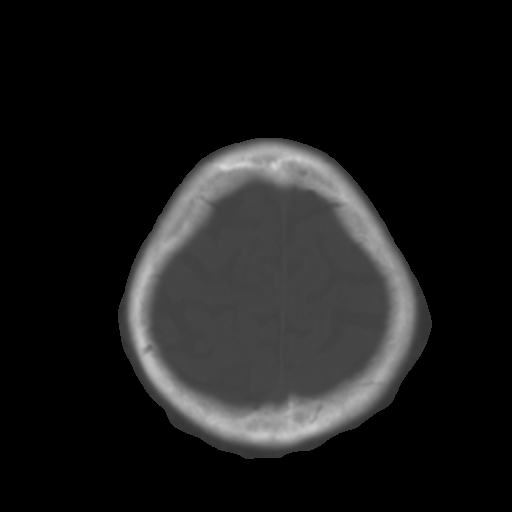
[im 26/28  brain]
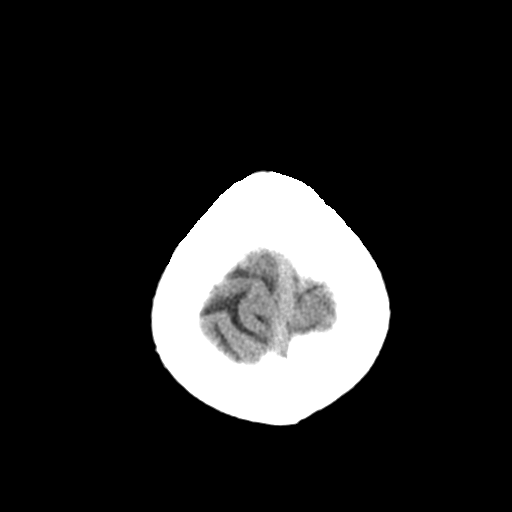

[Series 4: coronal soft tissue · coronal · 0.31mm/px · 3 of 69 slices shown]
[im 23/69  brain]
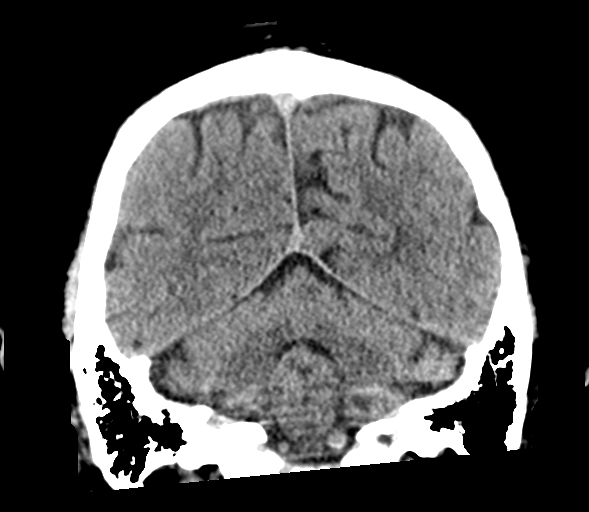
[im 31/69  brain]
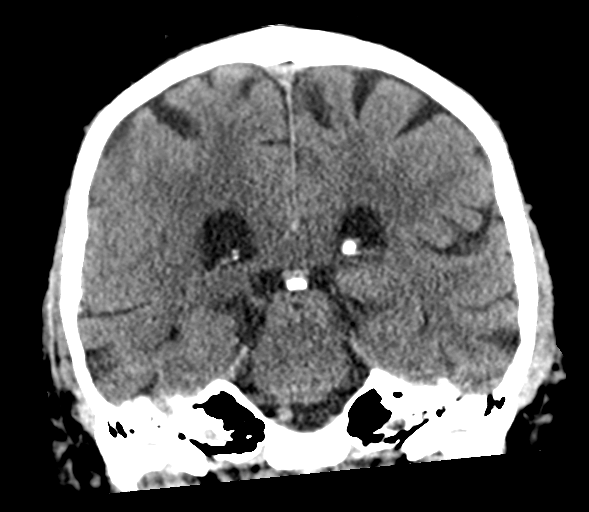
[im 38/69  brain]
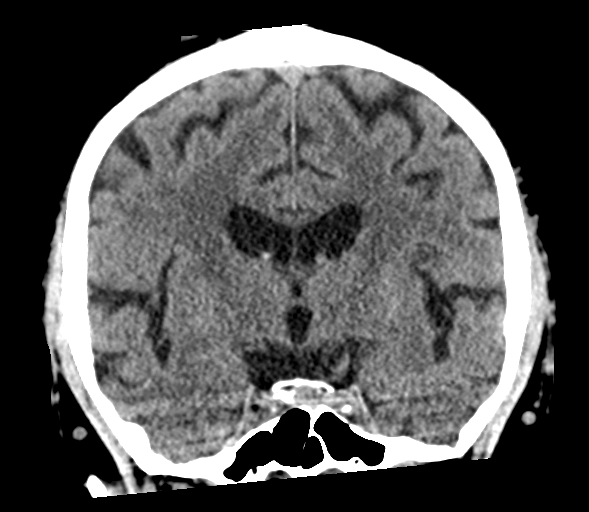

[Series 5: sagittal soft tissue · sagittal · 0.34mm/px · 3 of 61 slices shown]
[im 21/61  brain]
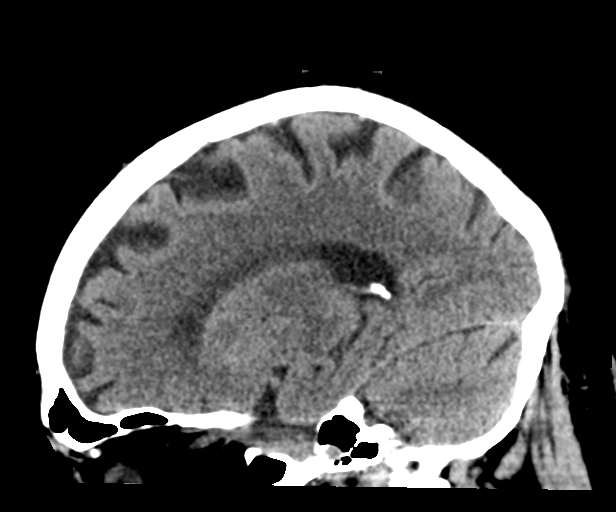
[im 31/61  brain]
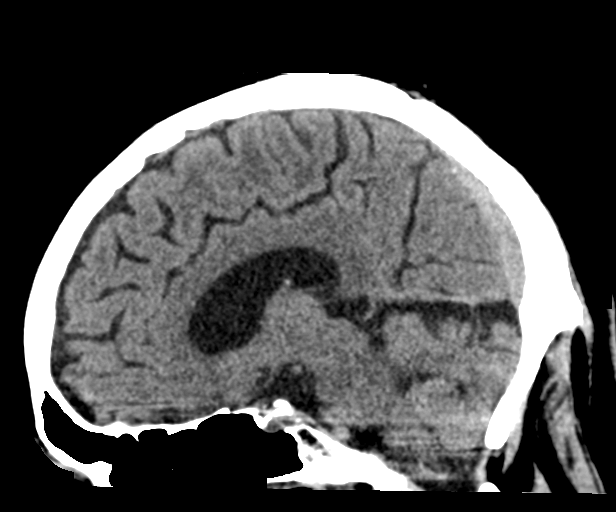
[im 41/61  brain]
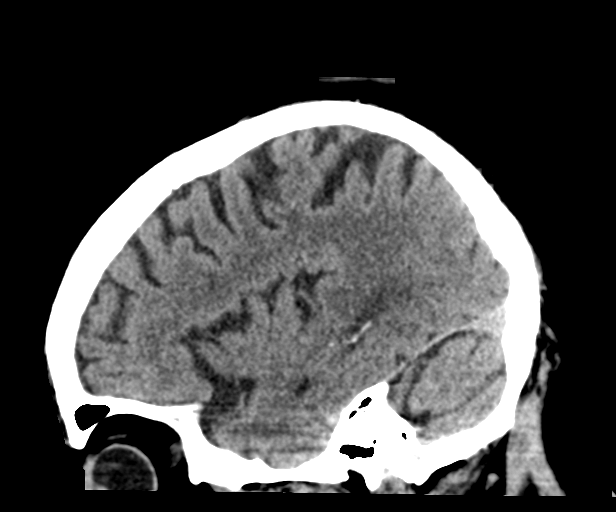

[16 of 45 positions shown; findings below may reference images not displayed]

FINDINGS: Brain: No acute territorial infarction, hemorrhage or intracranial
mass. Mild atrophy. Minimal small vessel ischemic changes of the
white matter. Stable ventricle size. Stable calcification in the
right basal ganglia.

Vascular: No hyperdense vessels.  Carotid vascular calcification.

Skull: Normal. Negative for fracture or focal lesion.

Sinuses/Orbits: No acute finding.

Other: None
IMPRESSION: 1. No CT evidence for acute intracranial abnormality.
2. Atrophy and minimal small vessel ischemic changes of the white
matter

## 2019-07-01 DIAGNOSIS — Z7901 Long term (current) use of anticoagulants: Secondary | ICD-10-CM | POA: Diagnosis not present

## 2019-08-04 DIAGNOSIS — I5022 Chronic systolic (congestive) heart failure: Secondary | ICD-10-CM | POA: Diagnosis not present

## 2019-08-05 DIAGNOSIS — Z7901 Long term (current) use of anticoagulants: Secondary | ICD-10-CM | POA: Diagnosis not present

## 2019-08-19 DIAGNOSIS — I10 Essential (primary) hypertension: Secondary | ICD-10-CM | POA: Diagnosis not present

## 2019-08-19 DIAGNOSIS — E039 Hypothyroidism, unspecified: Secondary | ICD-10-CM | POA: Diagnosis not present

## 2019-08-19 DIAGNOSIS — E1140 Type 2 diabetes mellitus with diabetic neuropathy, unspecified: Secondary | ICD-10-CM | POA: Diagnosis not present

## 2019-08-19 DIAGNOSIS — R0982 Postnasal drip: Secondary | ICD-10-CM | POA: Diagnosis not present

## 2019-08-19 DIAGNOSIS — I25810 Atherosclerosis of coronary artery bypass graft(s) without angina pectoris: Secondary | ICD-10-CM | POA: Diagnosis not present

## 2019-08-19 DIAGNOSIS — E782 Mixed hyperlipidemia: Secondary | ICD-10-CM | POA: Diagnosis not present

## 2019-08-19 DIAGNOSIS — R791 Abnormal coagulation profile: Secondary | ICD-10-CM | POA: Diagnosis not present

## 2019-08-19 DIAGNOSIS — Z125 Encounter for screening for malignant neoplasm of prostate: Secondary | ICD-10-CM | POA: Diagnosis not present

## 2019-08-19 DIAGNOSIS — Z95810 Presence of automatic (implantable) cardiac defibrillator: Secondary | ICD-10-CM | POA: Diagnosis not present

## 2019-08-19 DIAGNOSIS — I480 Paroxysmal atrial fibrillation: Secondary | ICD-10-CM | POA: Diagnosis not present

## 2019-08-26 DIAGNOSIS — I255 Ischemic cardiomyopathy: Secondary | ICD-10-CM | POA: Diagnosis not present

## 2019-08-26 DIAGNOSIS — J449 Chronic obstructive pulmonary disease, unspecified: Secondary | ICD-10-CM | POA: Diagnosis not present

## 2019-08-26 DIAGNOSIS — Z794 Long term (current) use of insulin: Secondary | ICD-10-CM | POA: Diagnosis not present

## 2019-08-26 DIAGNOSIS — Z0000 Encounter for general adult medical examination without abnormal findings: Secondary | ICD-10-CM | POA: Diagnosis not present

## 2019-08-26 DIAGNOSIS — I10 Essential (primary) hypertension: Secondary | ICD-10-CM | POA: Diagnosis not present

## 2019-08-26 DIAGNOSIS — I480 Paroxysmal atrial fibrillation: Secondary | ICD-10-CM | POA: Diagnosis not present

## 2019-08-26 DIAGNOSIS — E785 Hyperlipidemia, unspecified: Secondary | ICD-10-CM | POA: Diagnosis not present

## 2019-08-26 DIAGNOSIS — E119 Type 2 diabetes mellitus without complications: Secondary | ICD-10-CM | POA: Diagnosis not present

## 2019-08-26 DIAGNOSIS — B0229 Other postherpetic nervous system involvement: Secondary | ICD-10-CM | POA: Diagnosis not present

## 2019-08-28 DIAGNOSIS — Z95810 Presence of automatic (implantable) cardiac defibrillator: Secondary | ICD-10-CM | POA: Diagnosis not present

## 2019-08-28 DIAGNOSIS — I480 Paroxysmal atrial fibrillation: Secondary | ICD-10-CM | POA: Diagnosis not present

## 2019-08-28 DIAGNOSIS — E782 Mixed hyperlipidemia: Secondary | ICD-10-CM | POA: Diagnosis not present

## 2019-08-28 DIAGNOSIS — I5022 Chronic systolic (congestive) heart failure: Secondary | ICD-10-CM | POA: Diagnosis not present

## 2019-08-28 DIAGNOSIS — I10 Essential (primary) hypertension: Secondary | ICD-10-CM | POA: Diagnosis not present

## 2019-08-28 DIAGNOSIS — I255 Ischemic cardiomyopathy: Secondary | ICD-10-CM | POA: Diagnosis not present

## 2019-08-28 DIAGNOSIS — I25810 Atherosclerosis of coronary artery bypass graft(s) without angina pectoris: Secondary | ICD-10-CM | POA: Diagnosis not present

## 2019-08-28 DIAGNOSIS — I472 Ventricular tachycardia: Secondary | ICD-10-CM | POA: Diagnosis not present

## 2019-08-28 DIAGNOSIS — E1140 Type 2 diabetes mellitus with diabetic neuropathy, unspecified: Secondary | ICD-10-CM | POA: Diagnosis not present

## 2019-09-16 ENCOUNTER — Other Ambulatory Visit: Payer: Self-pay

## 2019-09-16 NOTE — Patient Outreach (Addendum)
[facility]) Care Management  09/16/2019  Dakota Thomas Dec 08, 1933 [id number]   Telephone Screen  Referral Date: 09/16/2019 Referral Source: [place] Dept. Referral Reason: "member spending 4hrs on phone monthly to get meds worked out, sometimes runs out of meds" Insurance: [employer]   Outreach attempt # 1 to patient. Spoke with patient regarding referral source and reason. Patient shares that he has been having issues with [facility] mail order pharmacy and local [facility] in which he both uses for his meds. Patient reports that he takes about 18 different meds and has done so for several years. He fills his own med planner monthly. He voices that he has been having trouble with meds not been delivered in timely manner and him running out of meds. He also voices some frustrations with his "controlled substance medications" and not being able to get them on automatic refill. Patient aware that it is a law regarding controlled substance and aware of the rationale. He states that he is trying to get assistance with "getting synchronized and have a better system of obtaining refills in timely manner" without having to make so many calls to different places. RN CM discussed with patient about getting meds that meet the criteria on automatic refill. Also, discussed using mail order for maintenance meds and getting 90 day supply. Discussed referral to [facility] for further assistance/support and gave verbal consent. Patient reports that he is healthy given his age and able to manage his medical conditions. He declines needing any other assistance. RN CM provided patient with contact info for any future RN CM needs or concerns.    Plan: RN CM will send [place] referral for possible med mgmt assistance and polypharmacy med review.    Enzo Montgomery, RN,BSN,CCM [place] Management Telephonic Care Management Coordinator Direct Phone: [phone] Toll Free:  [phone] Fax: [phone]

## 2019-09-22 DIAGNOSIS — Z7901 Long term (current) use of anticoagulants: Secondary | ICD-10-CM | POA: Diagnosis not present

## 2019-10-26 DIAGNOSIS — Z7901 Long term (current) use of anticoagulants: Secondary | ICD-10-CM | POA: Diagnosis not present

## 2019-11-03 DIAGNOSIS — I5022 Chronic systolic (congestive) heart failure: Secondary | ICD-10-CM | POA: Diagnosis not present

## 2019-11-25 DIAGNOSIS — Z7901 Long term (current) use of anticoagulants: Secondary | ICD-10-CM | POA: Diagnosis not present

## 2019-12-21 DIAGNOSIS — I25810 Atherosclerosis of coronary artery bypass graft(s) without angina pectoris: Secondary | ICD-10-CM | POA: Diagnosis not present

## 2019-12-21 DIAGNOSIS — Z794 Long term (current) use of insulin: Secondary | ICD-10-CM | POA: Diagnosis not present

## 2019-12-21 DIAGNOSIS — E1140 Type 2 diabetes mellitus with diabetic neuropathy, unspecified: Secondary | ICD-10-CM | POA: Diagnosis not present

## 2019-12-21 DIAGNOSIS — I255 Ischemic cardiomyopathy: Secondary | ICD-10-CM | POA: Diagnosis not present

## 2019-12-21 DIAGNOSIS — I340 Nonrheumatic mitral (valve) insufficiency: Secondary | ICD-10-CM | POA: Diagnosis not present

## 2019-12-21 DIAGNOSIS — R251 Tremor, unspecified: Secondary | ICD-10-CM | POA: Diagnosis not present

## 2019-12-21 DIAGNOSIS — Z7901 Long term (current) use of anticoagulants: Secondary | ICD-10-CM | POA: Diagnosis not present

## 2019-12-21 DIAGNOSIS — Z95810 Presence of automatic (implantable) cardiac defibrillator: Secondary | ICD-10-CM | POA: Diagnosis not present

## 2019-12-21 DIAGNOSIS — E039 Hypothyroidism, unspecified: Secondary | ICD-10-CM | POA: Diagnosis not present

## 2019-12-21 DIAGNOSIS — E782 Mixed hyperlipidemia: Secondary | ICD-10-CM | POA: Diagnosis not present

## 2019-12-28 DIAGNOSIS — Z95810 Presence of automatic (implantable) cardiac defibrillator: Secondary | ICD-10-CM | POA: Diagnosis not present

## 2019-12-28 DIAGNOSIS — E1140 Type 2 diabetes mellitus with diabetic neuropathy, unspecified: Secondary | ICD-10-CM | POA: Diagnosis not present

## 2019-12-28 DIAGNOSIS — I340 Nonrheumatic mitral (valve) insufficiency: Secondary | ICD-10-CM | POA: Diagnosis not present

## 2019-12-28 DIAGNOSIS — G4700 Insomnia, unspecified: Secondary | ICD-10-CM | POA: Diagnosis not present

## 2019-12-28 DIAGNOSIS — R251 Tremor, unspecified: Secondary | ICD-10-CM | POA: Diagnosis not present

## 2019-12-28 DIAGNOSIS — I25810 Atherosclerosis of coronary artery bypass graft(s) without angina pectoris: Secondary | ICD-10-CM | POA: Diagnosis not present

## 2019-12-28 DIAGNOSIS — I10 Essential (primary) hypertension: Secondary | ICD-10-CM | POA: Diagnosis not present

## 2019-12-28 DIAGNOSIS — I255 Ischemic cardiomyopathy: Secondary | ICD-10-CM | POA: Diagnosis not present

## 2019-12-28 DIAGNOSIS — Z0000 Encounter for general adult medical examination without abnormal findings: Secondary | ICD-10-CM | POA: Diagnosis not present

## 2020-01-20 DIAGNOSIS — Z7901 Long term (current) use of anticoagulants: Secondary | ICD-10-CM | POA: Diagnosis not present

## 2020-02-02 DIAGNOSIS — I5022 Chronic systolic (congestive) heart failure: Secondary | ICD-10-CM | POA: Diagnosis not present

## 2020-02-25 DIAGNOSIS — E782 Mixed hyperlipidemia: Secondary | ICD-10-CM | POA: Diagnosis not present

## 2020-02-25 DIAGNOSIS — I513 Intracardiac thrombosis, not elsewhere classified: Secondary | ICD-10-CM | POA: Diagnosis not present

## 2020-02-25 DIAGNOSIS — Z95810 Presence of automatic (implantable) cardiac defibrillator: Secondary | ICD-10-CM | POA: Diagnosis not present

## 2020-02-25 DIAGNOSIS — I10 Essential (primary) hypertension: Secondary | ICD-10-CM | POA: Diagnosis not present

## 2020-02-25 DIAGNOSIS — I480 Paroxysmal atrial fibrillation: Secondary | ICD-10-CM | POA: Diagnosis not present

## 2020-02-25 DIAGNOSIS — I5022 Chronic systolic (congestive) heart failure: Secondary | ICD-10-CM | POA: Diagnosis not present

## 2020-03-10 ENCOUNTER — Encounter: Payer: Self-pay | Admitting: Dietician

## 2020-03-10 ENCOUNTER — Other Ambulatory Visit: Payer: Self-pay

## 2020-03-10 ENCOUNTER — Encounter: Payer: Medicare HMO | Attending: Internal Medicine | Admitting: Dietician

## 2020-03-10 VITALS — Ht 73.0 in | Wt 203.2 lb

## 2020-03-10 DIAGNOSIS — Z794 Long term (current) use of insulin: Secondary | ICD-10-CM | POA: Diagnosis not present

## 2020-03-10 DIAGNOSIS — E1165 Type 2 diabetes mellitus with hyperglycemia: Secondary | ICD-10-CM | POA: Insufficient documentation

## 2020-03-10 DIAGNOSIS — E1140 Type 2 diabetes mellitus with diabetic neuropathy, unspecified: Secondary | ICD-10-CM | POA: Insufficient documentation

## 2020-03-10 DIAGNOSIS — I509 Heart failure, unspecified: Secondary | ICD-10-CM | POA: Diagnosis not present

## 2020-03-10 DIAGNOSIS — E1169 Type 2 diabetes mellitus with other specified complication: Secondary | ICD-10-CM | POA: Diagnosis not present

## 2020-03-10 DIAGNOSIS — I110 Hypertensive heart disease with heart failure: Secondary | ICD-10-CM | POA: Diagnosis not present

## 2020-03-10 DIAGNOSIS — R791 Abnormal coagulation profile: Secondary | ICD-10-CM | POA: Diagnosis not present

## 2020-03-10 DIAGNOSIS — E785 Hyperlipidemia, unspecified: Secondary | ICD-10-CM | POA: Diagnosis not present

## 2020-03-10 NOTE — Progress Notes (Signed)
Medical Nutrition Therapy: Visit start time: 7628  end time: 1645  Assessment:  Diagnosis: diabetes Past medical history: HTN, hyperlipidemia, CHF, GERD, cholestectomy  Psychosocial issues/ stress concerns: pt rates stress as "low" and feels "very well" about stress management   Preferred learning method:  . Auditory . Visual . Hands-on  Current weight: 203.2 lbs  Height: 6'1" Medications, supplements: reconciled in medical record  A1c- 8.1 in June 2021  Progress and evaluation:   Pt states he was diagnosed with DM 18+ years ago and taking insulin for 8+ years  Pt reported fasting BGs for the past two weeks: 76-108, 152, 200  1 episode of hypoglycemia in past two weeks    Pt reports he has decreased his bolus insulin- adjusted from 35 units to 25 units  Current sliding scale at meals- 0-2 units mostly, one 3 units, one 5 units  Pt states he does not cook, most meals are frozen dinners or take out   Physical activity: golf 1-2x/ week; 1x/week water aerobics   Dietary Intake:  Usual eating pattern includes 2-3 meals and 1 snacks per day. Dining out frequency: 3 meals per week.  Breakfast: 1 jimmy dean sausage with egg beaters, one english muffin with butter (6 days); special k with berries, splenda, almond milk (1 day week) Lunch: skips; pressed ham sandwich; ensure/boost diabetic formula  Supper: burger, frozen dinner (chicken, meatloaf, beef) Snack: chocolate donuts, apple turnovers, chocolate cupcakes  Beverages: 4 bottles of diet dr. Malachi Bonds, water  Nutrition Care Education: Basic nutrition: basic food groups, appropriate nutrient balance, appropriate meal and snack schedule, general nutrition guidelines   Weight control: importance of low sugar and low fat choices, portion control strategies  Advanced nutrition:  dining out, food label reading Diabetes:  goals for BGs, appropriate meal and snack schedule, appropriate carb intake and balance, healthy carb choices, role of  fiber, protein, fat Hypertension:   identifying high sodium foods, identifying food sources of potassium, magnesium Hyperlipidemia:  target goals for lipids, healthy and unhealthy fats, role of fiber, plant sterols, role of exercise  Nutritional Diagnosis:  NB-1.6 Limited adherence to nutrition-related recommendations As related to type 2 diabetes.  As evidenced by pt diet recall, recent A1c of 8.1, and resistance to make certain changes.  Intervention:  Pt states he requested referral to see dietitian for refresher of diabetes self management guidelines. Reviewed importance of using insulin to be proactive with meals and snacks instead of reactive.  Discussed importance of not skipping meals and how to avoid hypoglycemic episodes. Pt emphasized frozen meals are main dinner source, reviewed many examples of what to look for on nutrition labels.   Increase fruit and vegetable intake   Incorporate vegetables at lunch and dinner   Incorporate more F/V at snacks  Try frozen veggies that come in microwaveable pouches (may be tender than fresh and low prep time)  Try canned fruit NOT in heavy syrup (mandarin oranges, peaches, pears, applesauce) for snacks + protein (cheese, PB)   Increase fiber intake   Eat at least 3 servings of whole grains a day  Increase fruit and vegetable intake  Increase fluid intake   Drink at least 64oz water daily   Decrease sodium intake   Reduce sodium intake to recommended 1500mg /day   Read nutrition labels on packaged foods to monitor sodium intake   400-600 mg/meal  <200 mg/snack   Decrease fast food frequency   Avoid processed meats   Recommend limiting sweets and desserts to 1-2x/week  Education Materials given:  . General diet guidelines for Diabetes . Diabetic MyPlate handout  . Goals/ instructions  Learner/ who was taught:  . Patient   Level of understanding: Marland Kitchen Verbalizes/ demonstrates competency  Demonstrated degree of  understanding via:   Teach back Learning barriers: . None  Willingness to learn/ readiness for change: . Eager, change in progress  Monitoring and Evaluation:  Dietary intake, exercise, BG, A1c, and body weight      follow up: prn

## 2020-03-16 DIAGNOSIS — Z7901 Long term (current) use of anticoagulants: Secondary | ICD-10-CM | POA: Diagnosis not present

## 2020-03-16 DIAGNOSIS — M1712 Unilateral primary osteoarthritis, left knee: Secondary | ICD-10-CM | POA: Diagnosis not present

## 2020-03-16 DIAGNOSIS — S6991XA Unspecified injury of right wrist, hand and finger(s), initial encounter: Secondary | ICD-10-CM | POA: Diagnosis not present

## 2020-03-16 DIAGNOSIS — W010XXA Fall on same level from slipping, tripping and stumbling without subsequent striking against object, initial encounter: Secondary | ICD-10-CM | POA: Diagnosis not present

## 2020-03-16 DIAGNOSIS — S8992XA Unspecified injury of left lower leg, initial encounter: Secondary | ICD-10-CM | POA: Diagnosis not present

## 2020-03-17 ENCOUNTER — Other Ambulatory Visit: Payer: Self-pay

## 2020-03-17 ENCOUNTER — Other Ambulatory Visit: Payer: Self-pay | Admitting: Internal Medicine

## 2020-03-17 ENCOUNTER — Ambulatory Visit
Admission: RE | Admit: 2020-03-17 | Discharge: 2020-03-17 | Disposition: A | Discharge status: Home / Self Care | Payer: Medicare HMO | Source: Ambulatory Visit | Attending: Internal Medicine | Admitting: Internal Medicine

## 2020-03-17 DIAGNOSIS — R519 Headache, unspecified: Secondary | ICD-10-CM | POA: Diagnosis not present

## 2020-03-17 DIAGNOSIS — Z9181 History of falling: Secondary | ICD-10-CM

## 2020-03-17 DIAGNOSIS — R2681 Unsteadiness on feet: Secondary | ICD-10-CM | POA: Diagnosis not present

## 2020-03-17 DIAGNOSIS — I10 Essential (primary) hypertension: Secondary | ICD-10-CM | POA: Diagnosis not present

## 2020-03-17 DIAGNOSIS — J449 Chronic obstructive pulmonary disease, unspecified: Secondary | ICD-10-CM | POA: Diagnosis not present

## 2020-03-17 DIAGNOSIS — I480 Paroxysmal atrial fibrillation: Secondary | ICD-10-CM | POA: Diagnosis not present

## 2020-03-17 DIAGNOSIS — G44311 Acute post-traumatic headache, intractable: Secondary | ICD-10-CM | POA: Diagnosis not present

## 2020-03-17 DIAGNOSIS — E119 Type 2 diabetes mellitus without complications: Secondary | ICD-10-CM | POA: Diagnosis not present

## 2020-03-17 DIAGNOSIS — I472 Ventricular tachycardia: Secondary | ICD-10-CM | POA: Diagnosis not present

## 2020-03-17 DIAGNOSIS — Z794 Long term (current) use of insulin: Secondary | ICD-10-CM | POA: Diagnosis not present

## 2020-03-17 DIAGNOSIS — E039 Hypothyroidism, unspecified: Secondary | ICD-10-CM | POA: Diagnosis not present

## 2020-03-28 DIAGNOSIS — M7021 Olecranon bursitis, right elbow: Secondary | ICD-10-CM | POA: Diagnosis not present

## 2020-04-11 DIAGNOSIS — I480 Paroxysmal atrial fibrillation: Secondary | ICD-10-CM | POA: Diagnosis not present

## 2020-04-21 DIAGNOSIS — R791 Abnormal coagulation profile: Secondary | ICD-10-CM | POA: Diagnosis not present

## 2020-05-02 ENCOUNTER — Encounter: Payer: Self-pay | Admitting: *Deleted

## 2020-05-02 ENCOUNTER — Ambulatory Visit
Admission: EM | Admit: 2020-05-02 | Discharge: 2020-05-02 | Disposition: A | Discharge status: Home / Self Care | Payer: Medicare HMO | Attending: Emergency Medicine | Admitting: Emergency Medicine

## 2020-05-02 DIAGNOSIS — W269XXA Contact with unspecified sharp object(s), initial encounter: Secondary | ICD-10-CM

## 2020-05-02 DIAGNOSIS — S60551A Superficial foreign body of right hand, initial encounter: Secondary | ICD-10-CM

## 2020-05-02 DIAGNOSIS — Z23 Encounter for immunization: Secondary | ICD-10-CM | POA: Diagnosis not present

## 2020-05-02 DIAGNOSIS — W102XXA Fall (on)(from) incline, initial encounter: Secondary | ICD-10-CM

## 2020-05-02 MED ORDER — TETANUS-DIPHTH-ACELL PERTUSSIS 5-2.5-18.5 LF-MCG/0.5 IM SUSP
0.5000 mL | Freq: Once | INTRAMUSCULAR | Status: AC
  Administered 2020-05-02: 0.5 mL via INTRAMUSCULAR

## 2020-05-02 MED ORDER — CEPHALEXIN 500 MG PO CAPS: 500.0000 mg | ORAL_CAPSULE | Freq: Three times a day (TID) | ORAL | 0 refills | Status: AC

## 2020-05-02 NOTE — Discharge Instructions (Signed)
Keep your wound clean and dry.  Wash it gently twice a day with soap and water.  Apply an antibiotic cream twice a day.    Take the antibiotic as directed.    Return here if you see signs of infection, such as increased pain, redness, pus-like drainage, warmth, fever, chills, or other concerning symptoms.

## 2020-05-02 NOTE — ED Provider Notes (Signed)
Roderic Palau    CSN: [id number] Arrival date & time: 05/02/20  0934      History   Chief Complaint Chief Complaint  Patient presents with  . Puncture Wound    HPI Dakota Thomas is a 86 y.o. male.   Patient presents with sensation of foreign body in his right palm x 1 week.  The area is painful and "feels like there is a rock in it."  He has attempted to remove it at home without success.  He denies fever, chills, numbness, weakness, paresthesias, or other symptoms.  Patient fail 6 weeks ago, sliding down a 20 foot gravel area.  He was seen in the ED at that time.  Last tetanus 10 years.  His medical history includes diabetes, hypertension, CAD.  The history is provided by the patient and medical records.    Past Medical History:  Diagnosis Date  . A-fib ([place])   . AICD (automatic cardioverter/defibrillator) present   . Allergic state   . Anxiety   . Arrhythmia    atrial fib  . BPH (benign prostatic hyperplasia)   . CAD (coronary artery disease)   . Cancer (HCC)    skin of the nose and arm  . CHF (congestive heart failure) ([place])   . Diabetes mellitus without complication ([place])   . Family history of adverse reaction to anesthesia    daughter sleeps a long time  . H/O diverticulitis of colon   . Heart attack ([place]) 1985, 1995,2000, 2004,    multiple MI's  . Heart disease   . Heartburn   . History of kidney stones   . History of stomach ulcers   . HLD (hyperlipidemia)   . Hypertension   . Hypothyroidism   . Ischemic cardiomyopathy   . Kidney stones   . Neuropathy   . Pacemaker   . Post herpetic neuralgia    left side of face  . Renal disorder    UTI's  . Rosacea   . Testosterone deficiency   . Tremors of nervous system   . Trigeminal neuralgia of left side of face     Patient Active Problem List   Diagnosis Date Noted  . BPH with obstruction/lower urinary tract symptoms 12/25/2015  . Bacteremia 11/24/2015  . Near syncope 11/22/2015  . Diabetes  ([place]) 11/22/2015  . CAD (coronary artery disease) 11/22/2015  . HTN (hypertension) 11/22/2015  . A-fib ([place]) 11/22/2015  . BPH (benign prostatic hyperplasia) 11/22/2015    Past Surgical History:  Procedure Laterality Date  . APPENDECTOMY    . BACK SURGERY     cervical diskectomy  . CHOLECYSTECTOMY    . COLONOSCOPY WITH PROPOFOL N/A 01/27/2016   Procedure: COLONOSCOPY WITH PROPOFOL;  Surgeon: Manya Silvas, MD;  Location: [facility] ENDOSCOPY;  Service: Endoscopy;  Laterality: N/A;  . COLONOSCOPY WITH PROPOFOL N/A 03/20/2019   Procedure: COLONOSCOPY WITH PROPOFOL;  Surgeon: Toledo, Benay Pike, MD;  Location: [facility] ENDOSCOPY;  Service: Gastroenterology;  Laterality: N/A;  . CORONARY ANGIOPLASTY WITH STENT PLACEMENT     multiple times  . CORONARY ARTERY BYPASS GRAFT    . ESOPHAGOGASTRODUODENOSCOPY (EGD) WITH PROPOFOL N/A 03/20/2019   Procedure: ESOPHAGOGASTRODUODENOSCOPY (EGD) WITH PROPOFOL;  Surgeon: Toledo, Benay Pike, MD;  Location: [facility] ENDOSCOPY;  Service: Gastroenterology;  Laterality: N/A;  . eyelids    . GREEN LIGHT LASER TURP (TRANSURETHRAL RESECTION OF PROSTATE N/A 03/19/2017   Procedure: GREEN LIGHT LASER TURP (TRANSURETHRAL RESECTION OF PROSTATE;  Surgeon: Royston Cowper, MD;  Location:  [facility] ORS;  Service: Urology;  Laterality: N/A;  . IMPLANTABLE CARDIOVERTER DEFIBRILLATOR (ICD) GENERATOR CHANGE Left 05/30/2016   Procedure: ICD GENERATOR CHANGE;  Surgeon: Isaias Cowman, MD;  Location: [facility] ORS;  Service: Cardiovascular;  Laterality: Left;  . kidney stones    . NASAL SEPTUM SURGERY    . ostate surgery    . PACEMAKER INSERTION    . prostate heating     seven years ago  . TEE WITHOUT CARDIOVERSION N/A 11/28/2015   Procedure: Transesophageal Echocardiogram (Tee);  Surgeon: Teodoro Spray, MD;  Location: [facility] ORS;  Service: Cardiovascular;  Laterality: N/A;  . THYROID SURGERY    . TONSILLECTOMY     x 2       Home Medications    Prior to Admission medications   Medication  Sig Start Date End Date Taking? Authorizing Provider  acetaminophen (TYLENOL) 500 MG tablet Take 500-1,000 mg by mouth every 6 (six) hours as needed for mild pain.     [provider]  albuterol (VENTOLIN HFA) 108 (90 Base) MCG/ACT inhaler Inhale 2 puffs into the lungs every 6 (six) hours as needed for wheezing or shortness of breath.    [provider]  ALPRAZolam Duanne Moron) 0.5 MG tablet Take 0.5 mg by mouth 2 (two) times daily.     [provider]  amiodarone (PACERONE) 200 MG tablet Take 100 mg by mouth daily.    [provider]  ASPERCREME W/LIDOCAINE EX Apply 1 application topically daily as needed (back pain).    [provider]  atorvastatin (LIPITOR) 40 MG tablet Take 40 mg by mouth every evening.     [provider]  budesonide-formoterol (SYMBICORT) 160-4.5 MCG/ACT inhaler Inhale 2 puffs into the lungs 2 (two) times daily.    [provider]  cephALEXin (KEFLEX) 500 MG capsule Take 1 capsule (500 mg total) by mouth 3 (three) times daily for 7 days. 05/02/20 05/09/20  Sharion Balloon, NP  docusate sodium (COLACE) 100 MG capsule Take 2 capsules (200 mg total) by mouth 2 (two) times daily. Patient not taking: Reported on 03/20/2019 03/19/17   Royston Cowper, MD  gabapentin (NEURONTIN) 600 MG tablet Take 600 mg by mouth 2 (two) times daily.     [provider]  HYDROcodone-acetaminophen (NORCO/VICODIN) 5-325 MG tablet Take 1 tablet by mouth daily as needed (pain).  11/30/16   [provider]  insulin aspart (NOVOLOG) 100 UNIT/ML FlexPen Inject 0-6 Units into the skin 2 (two) times daily before a meal. Dose per sliding scale    [provider]  insulin glargine (LANTUS) 100 unit/mL SOPN Inject 25 Units into the skin at bedtime.     [provider]  levothyroxine (SYNTHROID, LEVOTHROID) 100 MCG tablet Take 100 mcg by mouth daily before breakfast.    [provider]  loperamide (IMODIUM) 2 MG  capsule Take 2 mg by mouth as needed for diarrhea or loose stools. Max 8 capsules per day    [provider]  metoprolol succinate (TOPROL-XL) 25 MG 24 hr tablet Take 25 mg by mouth daily.  10/10/15   [provider]  Multiple Vitamin (MULTIVITAMIN WITH MINERALS) TABS tablet Take 1 tablet by mouth daily.    [provider]  nitrofurantoin (MACRODANTIN) 100 MG capsule Take 1 capsule (100 mg total) by mouth 2 (two) times daily. 03/19/17   Royston Cowper, MD  omeprazole (PRILOSEC) 20 MG capsule Take 20 mg by mouth See admin instructions. Takes 20mg  every morning. Takes  an additional 20mg  in the evening as needed for acid reflux    [provider]  ondansetron (ZOFRAN ODT) 8 MG disintegrating tablet Take 0.5 tablets (4 mg total) by mouth every 6 (six) hours as needed for nausea or vomiting. Patient not taking: Reported on 03/20/2019 03/19/17   Royston Cowper, MD  Potassium Gluconate 550 (90 K) MG TABS Take 90 mg by mouth every other day.    [provider]  pyridOXINE (B-6) 50 MG tablet Take 100 mg by mouth daily.     [provider]  spironolactone (ALDACTONE) 25 MG tablet Take 25 mg by mouth daily.  10/10/15   [provider]  tamsulosin (FLOMAX) 0.4 MG CAPS capsule Take 2 capsules (0.8 mg total) by mouth daily. Patient not taking: Reported on 03/20/2019 12/18/16   Zara Council A, PA-C  testosterone cypionate (DEPOTESTOSTERONE CYPIONATE) 200 MG/ML injection INJECT 200mg (1 ML) IN THE MUSCLE EVERY 28 DAYS 01/10/17   [provider]  warfarin (COUMADIN) 2 MG tablet Take 2 mg by mouth as directed.    [provider]  warfarin (COUMADIN) 2.5 MG tablet Take 2.5 mg by mouth daily.    [provider]    Family History Family History  Problem Relation Age of Onset  . CAD Other   . Diabetes Other   . Hypertension Other   . Bladder Cancer Paternal Grandfather   . Kidney disease Neg Hx   . Prostate cancer Neg Hx   .  Kidney cancer Neg Hx     Social History Social History   Tobacco Use  . Smoking status: Former Smoker    Types: Cigarettes    Quit date: 05/22/1984    Years since quitting: 35.9  . Smokeless tobacco: Never Used  Vaping Use  . Vaping Use: Never used  Substance Use Topics  . Alcohol use: Yes    Alcohol/week: 0.0 - 1.0 standard drinks    Comment: 1 glass of wine per month  . Drug use: No     Allergies   Levaquin [levofloxacin], Ciprofloxacin, Lidocaine, Metformin, Tannic acid, and Tamsulosin hcl   Review of Systems Review of Systems  Constitutional: Negative for chills and fever.  HENT: Negative for ear pain and sore throat.   Eyes: Negative for pain and visual disturbance.  Respiratory: Negative for cough and shortness of breath.   Cardiovascular: Negative for chest pain and palpitations.  Gastrointestinal: Negative for abdominal pain and vomiting.  Genitourinary: Negative for dysuria and hematuria.  Musculoskeletal: Negative for arthralgias and back pain.  Skin: Positive for wound. Negative for color change and rash.  Neurological: Negative for seizures and syncope.  All other systems reviewed and are negative.    Physical Exam Triage Vital Signs ED Triage Vitals  Enc Vitals Group     BP 05/02/20 1007 108/71     Pulse Rate 05/02/20 1007 (!) 58     Resp 05/02/20 1007 16     Temp 05/02/20 1007 98 F (36.7 C)     Temp Source 05/02/20 1007 Oral     SpO2 05/02/20 1007 96 %     Weight --      Height --      Head Circumference --      Peak Flow --      Pain Score 05/02/20 1004 0     Pain Loc --      Pain Edu? --      Excl. in [place]? --    No data  found.  Updated Vital Signs BP 108/71 (BP Location: Left Arm)   Pulse (!) 58   Temp 98 F (36.7 C) (Oral)   Resp 16   SpO2 96%   Visual Acuity Right Eye Distance:   Left Eye Distance:   Bilateral Distance:    Right Eye Near:   Left Eye Near:    Bilateral Near:     Physical Exam Vitals and nursing note  reviewed.  Constitutional:      General: He is not in acute distress.    Appearance: He is well-developed. He is not ill-appearing.  HENT:     Head: Normocephalic and atraumatic.     Mouth/Throat:     Mouth: Mucous membranes are moist.  Eyes:     Conjunctiva/sclera: Conjunctivae normal.  Cardiovascular:     Rate and Rhythm: Normal rate and regular rhythm.     Heart sounds: No murmur heard.   Pulmonary:     Effort: Pulmonary effort is normal. No respiratory distress.     Breath sounds: Normal breath sounds.  Abdominal:     Palpations: Abdomen is soft.     Tenderness: There is no abdominal tenderness.  Musculoskeletal:       Hands:     Cervical back: Neck supple.     Comments: 2 mm hard foreign body palpated in right palm; tender to touch, superficial wound is open with no drainage.  Skin:    General: Skin is warm and dry.     Capillary Refill: Capillary refill takes less than 2 seconds.     Findings: Lesion present.  Neurological:     General: No focal deficit present.     Mental Status: He is alert and oriented to person, place, and time.     Sensory: No sensory deficit.     Motor: No weakness.     Gait: Gait normal.  Psychiatric:        Mood and Affect: Mood normal.        Behavior: Behavior normal.      UC Treatments / Results  Labs (all labs ordered are listed, but only abnormal results are displayed) Labs Reviewed - No data to display  EKG   Radiology No results found.  Procedures Foreign Body Removal  Date/Time: 05/02/2020 11:02 AM Performed by: Sharion Balloon, NP Authorized by: Sharion Balloon, NP   Consent:    Consent obtained:  Verbal   Consent given by:  Patient   Risks discussed:  Bleeding, infection and incomplete removal Location:    Location:  Hand   Hand location:  R palm   Depth:  Intradermal   Tendon involvement:  None Pre-procedure details:    Imaging:  None   Neurovascular status: intact   Anesthesia (see MAR for exact dosages):      Anesthesia method:  Local infiltration   Local anesthetic:  Lidocaine 1% w/o epi Procedure type:    Procedure complexity:  Simple Procedure details:    Scalpel size:  11   Incision length:  3 mm   Removal mechanism:  Forceps   Foreign bodies recovered:  1   Description:  Rock   Intact foreign body removal: yes   Post-procedure details:    Neurovascular status: intact     Confirmation:  No additional foreign bodies on visualization   Skin closure:  None   Dressing:  Antibiotic ointment and non-adherent dressing   Patient tolerance of procedure:  Tolerated well, no immediate complications   (including critical  care time)  Medications Ordered in UC Medications  Tdap (BOOSTRIX) injection 0.5 mL (0.5 mLs Intramuscular Given 05/02/20 1051)    Initial Impression / Assessment and Plan / UC Course  I have reviewed the triage vital signs and the nursing notes.  Pertinent labs & imaging results that were available during my care of the patient were reviewed by me and considered in my medical decision making (see chart for details).   Acute foreign body of the right palm.  Removed intact.  Tetanus updated.  Wound care instructions and signs of infection discussed with patient.  Treating with Keflex.  Instructed him to follow-up with his PCP or return here if he notes signs of infection.  Patient agrees to plan of care.   Final Clinical Impressions(s) / UC Diagnoses   Final diagnoses:  Acute foreign body of right palm, initial encounter     Discharge Instructions     Keep your wound clean and dry.  Wash it gently twice a day with soap and water.  Apply an antibiotic cream twice a day.    Take the antibiotic as directed.    Return here if you see signs of infection, such as increased pain, redness, pus-like drainage, warmth, fever, chills, or other concerning symptoms.       ED Prescriptions    Medication Sig Dispense Auth. Provider   cephALEXin (KEFLEX) 500 MG capsule Take  1 capsule (500 mg total) by mouth 3 (three) times daily for 7 days. 21 capsule Sharion Balloon, NP     PDMP not reviewed this encounter.   Sharion Balloon, NP 05/02/20 [phone]

## 2020-05-02 NOTE — ED Triage Notes (Signed)
Patient reports falling 6 weeks ago--was seen for same (xrays and CTs completed). States now has noticed something embedded in right anterior palm of hand.

## 2020-05-03 DIAGNOSIS — I5022 Chronic systolic (congestive) heart failure: Secondary | ICD-10-CM | POA: Diagnosis not present

## 2020-05-23 DIAGNOSIS — R69 Illness, unspecified: Secondary | ICD-10-CM | POA: Diagnosis not present

## 2020-05-23 DIAGNOSIS — I480 Paroxysmal atrial fibrillation: Secondary | ICD-10-CM | POA: Diagnosis not present

## 2020-06-06 DIAGNOSIS — R791 Abnormal coagulation profile: Secondary | ICD-10-CM | POA: Diagnosis not present

## 2020-06-21 DIAGNOSIS — I10 Essential (primary) hypertension: Secondary | ICD-10-CM | POA: Diagnosis not present

## 2020-06-21 DIAGNOSIS — I340 Nonrheumatic mitral (valve) insufficiency: Secondary | ICD-10-CM | POA: Diagnosis not present

## 2020-06-21 DIAGNOSIS — R251 Tremor, unspecified: Secondary | ICD-10-CM | POA: Diagnosis not present

## 2020-06-21 DIAGNOSIS — I25810 Atherosclerosis of coronary artery bypass graft(s) without angina pectoris: Secondary | ICD-10-CM | POA: Diagnosis not present

## 2020-06-21 DIAGNOSIS — I255 Ischemic cardiomyopathy: Secondary | ICD-10-CM | POA: Diagnosis not present

## 2020-06-21 DIAGNOSIS — Z95810 Presence of automatic (implantable) cardiac defibrillator: Secondary | ICD-10-CM | POA: Diagnosis not present

## 2020-06-21 DIAGNOSIS — Z1211 Encounter for screening for malignant neoplasm of colon: Secondary | ICD-10-CM | POA: Diagnosis not present

## 2020-06-21 DIAGNOSIS — E1140 Type 2 diabetes mellitus with diabetic neuropathy, unspecified: Secondary | ICD-10-CM | POA: Diagnosis not present

## 2020-06-21 DIAGNOSIS — Z1212 Encounter for screening for malignant neoplasm of rectum: Secondary | ICD-10-CM | POA: Diagnosis not present

## 2020-06-21 DIAGNOSIS — Z794 Long term (current) use of insulin: Secondary | ICD-10-CM | POA: Diagnosis not present

## 2020-06-21 DIAGNOSIS — I480 Paroxysmal atrial fibrillation: Secondary | ICD-10-CM | POA: Diagnosis not present

## 2020-06-21 DIAGNOSIS — Z125 Encounter for screening for malignant neoplasm of prostate: Secondary | ICD-10-CM | POA: Diagnosis not present

## 2020-06-21 DIAGNOSIS — Z0000 Encounter for general adult medical examination without abnormal findings: Secondary | ICD-10-CM | POA: Diagnosis not present

## 2020-06-28 DIAGNOSIS — Z794 Long term (current) use of insulin: Secondary | ICD-10-CM | POA: Diagnosis not present

## 2020-06-28 DIAGNOSIS — Z95810 Presence of automatic (implantable) cardiac defibrillator: Secondary | ICD-10-CM | POA: Diagnosis not present

## 2020-06-28 DIAGNOSIS — Z79899 Other long term (current) drug therapy: Secondary | ICD-10-CM | POA: Diagnosis not present

## 2020-06-28 DIAGNOSIS — I480 Paroxysmal atrial fibrillation: Secondary | ICD-10-CM | POA: Diagnosis not present

## 2020-06-28 DIAGNOSIS — E119 Type 2 diabetes mellitus without complications: Secondary | ICD-10-CM | POA: Diagnosis not present

## 2020-06-28 DIAGNOSIS — J42 Unspecified chronic bronchitis: Secondary | ICD-10-CM | POA: Diagnosis not present

## 2020-06-28 DIAGNOSIS — E349 Endocrine disorder, unspecified: Secondary | ICD-10-CM | POA: Diagnosis not present

## 2020-06-28 DIAGNOSIS — E1165 Type 2 diabetes mellitus with hyperglycemia: Secondary | ICD-10-CM | POA: Diagnosis not present

## 2020-06-30 DIAGNOSIS — Z13820 Encounter for screening for osteoporosis: Secondary | ICD-10-CM | POA: Diagnosis not present

## 2020-06-30 DIAGNOSIS — M8588 Other specified disorders of bone density and structure, other site: Secondary | ICD-10-CM | POA: Diagnosis not present

## 2020-07-05 DIAGNOSIS — R791 Abnormal coagulation profile: Secondary | ICD-10-CM | POA: Diagnosis not present

## 2020-07-27 DIAGNOSIS — Z7901 Long term (current) use of anticoagulants: Secondary | ICD-10-CM | POA: Diagnosis not present

## 2020-07-27 DIAGNOSIS — R195 Other fecal abnormalities: Secondary | ICD-10-CM | POA: Diagnosis not present

## 2020-08-08 DIAGNOSIS — I480 Paroxysmal atrial fibrillation: Secondary | ICD-10-CM | POA: Diagnosis not present

## 2020-08-18 DIAGNOSIS — I480 Paroxysmal atrial fibrillation: Secondary | ICD-10-CM | POA: Diagnosis not present

## 2020-08-18 DIAGNOSIS — I5022 Chronic systolic (congestive) heart failure: Secondary | ICD-10-CM | POA: Diagnosis not present

## 2020-08-18 DIAGNOSIS — I10 Essential (primary) hypertension: Secondary | ICD-10-CM | POA: Diagnosis not present

## 2020-08-18 DIAGNOSIS — E782 Mixed hyperlipidemia: Secondary | ICD-10-CM | POA: Diagnosis not present

## 2020-08-18 DIAGNOSIS — I472 Ventricular tachycardia: Secondary | ICD-10-CM | POA: Diagnosis not present

## 2020-08-18 DIAGNOSIS — I25810 Atherosclerosis of coronary artery bypass graft(s) without angina pectoris: Secondary | ICD-10-CM | POA: Diagnosis not present

## 2020-08-22 ENCOUNTER — Other Ambulatory Visit
Admission: RE | Admit: 2020-08-22 | Discharge: 2020-08-22 | Disposition: A | Discharge status: Home / Self Care | Payer: Medicare HMO | Source: Ambulatory Visit | Attending: Internal Medicine | Admitting: Internal Medicine

## 2020-08-22 ENCOUNTER — Other Ambulatory Visit: Payer: Self-pay

## 2020-08-22 DIAGNOSIS — Z01812 Encounter for preprocedural laboratory examination: Secondary | ICD-10-CM | POA: Insufficient documentation

## 2020-08-22 DIAGNOSIS — Z20822 Contact with and (suspected) exposure to covid-19: Secondary | ICD-10-CM | POA: Insufficient documentation

## 2020-08-22 LAB — SARS CORONAVIRUS 2 (TAT 6-24 HRS): SARS Coronavirus 2: NEGATIVE

## 2020-08-23 ENCOUNTER — Encounter: Payer: Self-pay | Admitting: Internal Medicine

## 2020-08-24 ENCOUNTER — Ambulatory Visit: Payer: Medicare HMO | Admitting: Anesthesiology

## 2020-08-24 ENCOUNTER — Ambulatory Visit
Admission: RE | Admit: 2020-08-24 | Discharge: 2020-08-24 | Disposition: A | Discharge status: Home / Self Care | Payer: Medicare HMO | Attending: Internal Medicine | Admitting: Internal Medicine

## 2020-08-24 ENCOUNTER — Encounter: Payer: Self-pay | Admitting: Internal Medicine

## 2020-08-24 ENCOUNTER — Encounter
Admission: RE | Disposition: A | Discharge status: Home / Self Care | Payer: Self-pay | Source: Home / Self Care | Attending: Internal Medicine

## 2020-08-24 ENCOUNTER — Other Ambulatory Visit: Payer: Self-pay

## 2020-08-24 DIAGNOSIS — Z7951 Long term (current) use of inhaled steroids: Secondary | ICD-10-CM | POA: Diagnosis not present

## 2020-08-24 DIAGNOSIS — Z79899 Other long term (current) drug therapy: Secondary | ICD-10-CM | POA: Diagnosis not present

## 2020-08-24 DIAGNOSIS — K640 First degree hemorrhoids: Secondary | ICD-10-CM | POA: Diagnosis not present

## 2020-08-24 DIAGNOSIS — I255 Ischemic cardiomyopathy: Secondary | ICD-10-CM | POA: Diagnosis not present

## 2020-08-24 DIAGNOSIS — Z888 Allergy status to other drugs, medicaments and biological substances status: Secondary | ICD-10-CM | POA: Diagnosis not present

## 2020-08-24 DIAGNOSIS — I509 Heart failure, unspecified: Secondary | ICD-10-CM | POA: Insufficient documentation

## 2020-08-24 DIAGNOSIS — I252 Old myocardial infarction: Secondary | ICD-10-CM | POA: Diagnosis not present

## 2020-08-24 DIAGNOSIS — R195 Other fecal abnormalities: Secondary | ICD-10-CM | POA: Insufficient documentation

## 2020-08-24 DIAGNOSIS — Z86010 Personal history of colonic polyps: Secondary | ICD-10-CM | POA: Diagnosis not present

## 2020-08-24 DIAGNOSIS — Z794 Long term (current) use of insulin: Secondary | ICD-10-CM | POA: Diagnosis not present

## 2020-08-24 DIAGNOSIS — D689 Coagulation defect, unspecified: Secondary | ICD-10-CM | POA: Insufficient documentation

## 2020-08-24 DIAGNOSIS — E119 Type 2 diabetes mellitus without complications: Secondary | ICD-10-CM | POA: Insufficient documentation

## 2020-08-24 DIAGNOSIS — Z881 Allergy status to other antibiotic agents status: Secondary | ICD-10-CM | POA: Diagnosis not present

## 2020-08-24 DIAGNOSIS — I472 Ventricular tachycardia: Secondary | ICD-10-CM | POA: Diagnosis not present

## 2020-08-24 DIAGNOSIS — Z950 Presence of cardiac pacemaker: Secondary | ICD-10-CM | POA: Diagnosis not present

## 2020-08-24 DIAGNOSIS — I110 Hypertensive heart disease with heart failure: Secondary | ICD-10-CM | POA: Diagnosis not present

## 2020-08-24 DIAGNOSIS — I2510 Atherosclerotic heart disease of native coronary artery without angina pectoris: Secondary | ICD-10-CM | POA: Insufficient documentation

## 2020-08-24 DIAGNOSIS — Z7901 Long term (current) use of anticoagulants: Secondary | ICD-10-CM | POA: Diagnosis not present

## 2020-08-24 DIAGNOSIS — I4891 Unspecified atrial fibrillation: Secondary | ICD-10-CM | POA: Diagnosis not present

## 2020-08-24 HISTORY — DX: Diverticulitis of intestine, part unspecified, without perforation or abscess without bleeding: K57.92

## 2020-08-24 HISTORY — DX: Ventricular tachycardia: I47.2

## 2020-08-24 HISTORY — DX: Ventricular tachycardia, unspecified: I47.20

## 2020-08-24 HISTORY — PX: COLONOSCOPY WITH PROPOFOL: SHX5780

## 2020-08-24 HISTORY — DX: Bacteremia: R78.81

## 2020-08-24 LAB — GLUCOSE, CAPILLARY: Glucose-Capillary: 108 mg/dL — ABNORMAL HIGH (ref 70–99)

## 2020-08-24 SURGERY — COLONOSCOPY WITH PROPOFOL
Anesthesia: General

## 2020-08-24 MED ORDER — PROPOFOL 500 MG/50ML IV EMUL
INTRAVENOUS | Status: DC | PRN
  Administered 2020-08-24: 165 ug/kg/min via INTRAVENOUS

## 2020-08-24 MED ORDER — SODIUM CHLORIDE 0.9 % IV SOLN: INTRAVENOUS | Status: DC

## 2020-08-24 MED ORDER — GLYCOPYRROLATE 0.2 MG/ML IJ SOLN
INTRAMUSCULAR | Status: DC | PRN
  Administered 2020-08-24: .2 mg via INTRAVENOUS

## 2020-08-24 MED ORDER — MIDAZOLAM HCL 2 MG/2ML IJ SOLN
INTRAMUSCULAR | Status: AC
  Filled 2020-08-24: qty 2

## 2020-08-24 MED ORDER — PROPOFOL 10 MG/ML IV BOLUS
INTRAVENOUS | Status: DC | PRN
  Administered 2020-08-24: 40 mg via INTRAVENOUS
  Administered 2020-08-24: 60 mg via INTRAVENOUS

## 2020-08-24 MED ORDER — MIDAZOLAM HCL 2 MG/2ML IJ SOLN
INTRAMUSCULAR | Status: DC | PRN
  Administered 2020-08-24: 2 mg via INTRAVENOUS

## 2020-08-24 NOTE — Anesthesia Procedure Notes (Signed)
Procedure Name: General with mask airway Performed by: Fletcher-Harrison, Dania Marsan, CRNA Pre-anesthesia Checklist: Patient identified, Emergency Drugs available, Suction available and Patient being monitored Patient Re-evaluated:Patient Re-evaluated prior to induction Oxygen Delivery Method: Simple face mask Induction Type: IV induction Placement Confirmation: positive ETCO2 and CO2 detector Dental Injury: Teeth and Oropharynx as per pre-operative assessment        

## 2020-08-24 NOTE — Anesthesia Preprocedure Evaluation (Signed)
Anesthesia Evaluation  Patient identified by MRN, date of birth, ID band Patient awake    Reviewed: Allergy & Precautions, H&P , NPO status , Patient's Chart, lab work & pertinent test results  History of Anesthesia Complications (+) Family history of anesthesia reaction and history of anesthetic complications  Airway Mallampati: III  TM Distance: <3 FB Neck ROM: limited    Dental  (+) Chipped, Poor Dentition, Missing   Pulmonary neg shortness of breath, former smoker,    Pulmonary exam normal        Cardiovascular Exercise Tolerance: Good hypertension, (-) angina+ CAD, + Past MI, + Cardiac Stents, + CABG and +CHF  Normal cardiovascular exam+ dysrhythmias Atrial Fibrillation + pacemaker ([employer]) + Cardiac Defibrillator      Neuro/Psych PSYCHIATRIC DISORDERS  Neuromuscular disease    GI/Hepatic negative GI ROS, Neg liver ROS,   Endo/Other  diabetes, Type 2Hypothyroidism   Renal/GU Renal diseasenegative Renal ROS  negative genitourinary   Musculoskeletal   Abdominal   Peds  Hematology negative hematology ROS (+)   Anesthesia Other Findings Past Medical History: 2017: A-fib ([place]) No date: AICD (automatic cardioverter/defibrillator) present No date: Allergic state No date: Anxiety No date: Arrhythmia     Comment:  atrial fib No date: Bacteremia No date: BPH (benign prostatic hyperplasia) No date: CAD (coronary artery disease) No date: [facility])     Comment:  skin of the nose and arm No date: CHF (congestive heart failure) ([place]) 2009: Clotting disorder ([place]) No date: Diabetes mellitus without complication (HCC) No date: Diverticulitis No date: Family history of adverse reaction to anesthesia     Comment:  daughter sleeps a long time No date: H/O diverticulitis of colon 1985, 1995,2000, 2004, : Heart attack ([place])     Comment:  multiple MI's No date: Heart disease No date: Heartburn No date: History of  kidney stones No date: History of stomach ulcers No date: HLD (hyperlipidemia) No date: Hypertension No date: Hypothyroidism No date: Ischemic cardiomyopathy No date: Kidney stones No date: Neuropathy No date: Pacemaker No date: Post herpetic neuralgia     Comment:  left side of face No date: Renal disorder     Comment:  UTI's No date: Rosacea No date: Testosterone deficiency No date: Tremors of nervous system No date: Trigeminal neuralgia of left side of face No date: Ventricular tachycardia ([place])  Past Surgical History: No date: acid No date: APPENDECTOMY No date: BACK SURGERY     Comment:  cervical diskectomy No date: CERVICAL DISCECTOMY No date: CHOLECYSTECTOMY 01/27/2016: COLONOSCOPY WITH PROPOFOL; N/A     Comment:  Procedure: COLONOSCOPY WITH PROPOFOL;  Surgeon: Manya Silvas, MD;  Location: [facility] ENDOSCOPY;  Service:               Endoscopy;  Laterality: N/A; 03/20/2019: COLONOSCOPY WITH PROPOFOL; N/A     Comment:  Procedure: COLONOSCOPY WITH PROPOFOL;  Surgeon: Toledo,               Benay Pike, MD;  Location: [facility] ENDOSCOPY;  Service:               Gastroenterology;  Laterality: N/A; No date: CORONARY ANGIOPLASTY WITH STENT PLACEMENT     Comment:  multiple times No date: CORONARY ARTERY BYPASS GRAFT 03/20/2019: ESOPHAGOGASTRODUODENOSCOPY (EGD) WITH PROPOFOL; N/A     Comment:  Procedure: ESOPHAGOGASTRODUODENOSCOPY (EGD) WITH  PROPOFOL;  Surgeon: Toledo, Benay Pike, MD;  Location:               [facility] ENDOSCOPY;  Service: Gastroenterology;  Laterality:               N/A; No date: eyelids 03/19/2017: GREEN LIGHT LASER TURP (TRANSURETHRAL RESECTION OF  PROSTATE; N/A     Comment:  Procedure: GREEN LIGHT LASER TURP (TRANSURETHRAL               RESECTION OF PROSTATE;  Surgeon: Royston Cowper, MD;                Location: [facility] ORS;  Service: Urology;  Laterality: N/A; 05/30/2016: IMPLANTABLE CARDIOVERTER DEFIBRILLATOR (ICD) GENERATOR  CHANGE; Left      Comment:  Procedure: ICD GENERATOR CHANGE;  Surgeon: Isaias Cowman, MD;  Location: [facility] ORS;  Service:               Cardiovascular;  Laterality: Left; No date: INSERT / REPLACE / REMOVE PACEMAKER No date: kidney stones No date: NASAL SEPTUM SURGERY No date: NASAL SEPTUM SURGERY No date: ostate surgery No date: PACEMAKER INSERTION No date: prostate heating     Comment:  seven years ago No date: PROSTATE SURGERY 11/28/2015: TEE WITHOUT CARDIOVERSION; N/A     Comment:  Procedure: Transesophageal Echocardiogram (Tee);                Surgeon: Teodoro Spray, MD;  Location: [facility] ORS;                Service: Cardiovascular;  Laterality: N/A; No date: THYROID SURGERY No date: TONSILLECTOMY     Comment:  x 2  BMI    Body Mass Index: 25.60 kg/m      Reproductive/Obstetrics negative OB ROS                             Anesthesia Physical Anesthesia Plan  ASA: IV  Anesthesia Plan: General   Post-op Pain Management:    Induction: Intravenous  PONV Risk Score and Plan: Propofol infusion and TIVA  Airway Management Planned: Natural Airway and Nasal Cannula  Additional Equipment:   Intra-op Plan:   Post-operative Plan:   Informed Consent: I have reviewed the patients History and Physical, chart, labs and discussed the procedure including the risks, benefits and alternatives for the proposed anesthesia with the patient or authorized representative who has indicated his/her understanding and acceptance.     Dental Advisory Given  Plan Discussed with: Anesthesiologist, CRNA and Surgeon  Anesthesia Plan Comments: (Patient consented for risks of anesthesia including but not limited to:  - adverse reactions to medications - risk of airway placement if required - damage to eyes, teeth, lips or other oral mucosa - nerve damage due to positioning  - sore throat or hoarseness - Damage to heart, brain, nerves, lungs, other parts of  body or loss of life  Patient voiced understanding.)        Anesthesia Quick Evaluation

## 2020-08-24 NOTE — Interval H&P Note (Signed)
History and Physical Interval Note:  08/24/2020 11:41 AM  Dakota Thomas  has presented today for surgery, with the diagnosis of positive cologuard.  The various methods of treatment have been discussed with the patient and family. After consideration of risks, benefits and other options for treatment, the patient has consented to  Procedure(s): COLONOSCOPY WITH PROPOFOL (N/A) as a surgical intervention.  The patient's history has been reviewed, patient examined, no change in status, stable for surgery.  I have reviewed the patient's chart and labs.  Questions were answered to the patient's satisfaction.     [place], [place]

## 2020-08-24 NOTE — H&P (Signed)
Outpatient short stay form Pre-procedure 08/24/2020 11:39 AM Ashleyann Shoun K. Alice Reichert, M.D.  Primary Physician: Tracie Harrier, M.D.  Reason for visit:  Positive Cologuard test  History of present illness:  Patient is an 87 y/o male with hx of adenomatous colon polyps of the colon presenting with a positive fecal dna test (Cologuard test). Patient denies change in bowel habits, rectal bleeding, weight loss or abdominal pain.      Current Facility-Administered Medications:  .  0.9 %  sodium chloride infusion, , Intravenous, Continuous, [place], Benay Pike, MD, Last Rate: 20 mL/hr at 08/24/20 1100, New Bag at 08/24/20 1100  Medications Prior to Admission  Medication Sig Dispense Refill Last Dose  . ALPRAZolam (XANAX) 0.5 MG tablet Take 0.5 mg by mouth 2 (two) times daily.   08/23/2020 at Unknown time  . amiodarone (PACERONE) 200 MG tablet Take 100 mg by mouth daily.   08/24/2020 at Unknown time  . atorvastatin (LIPITOR) 40 MG tablet Take 40 mg by mouth every evening.    08/23/2020 at Unknown time  . gabapentin (NEURONTIN) 600 MG tablet Take 600 mg by mouth 2 (two) times daily.    08/23/2020 at Unknown time  . insulin glargine (LANTUS) 100 unit/mL SOPN Inject 25 Units into the skin at bedtime.    08/23/2020 at Unknown time  . levothyroxine (SYNTHROID, LEVOTHROID) 100 MCG tablet Take 100 mcg by mouth daily before breakfast.   08/23/2020 at Unknown time  . loperamide (IMODIUM) 2 MG capsule Take 2 mg by mouth as needed for diarrhea or loose stools. Max 8 capsules per day   Past Week at Unknown time  . metoprolol succinate (TOPROL-XL) 25 MG 24 hr tablet Take 25 mg by mouth daily.    08/24/2020 at Unknown time  . omeprazole (PRILOSEC) 20 MG capsule Take 20 mg by mouth See admin instructions. Takes 20mg  every morning. Takes an additional 20mg  in the evening as needed for acid reflux   08/23/2020 at Unknown time  . spironolactone (ALDACTONE) 25 MG tablet Take 25 mg by mouth daily.    08/23/2020 at Unknown time  . traMADol  (ULTRAM) 50 MG tablet Take by mouth as needed.   Past Week at Unknown time  . acetaminophen (TYLENOL) 500 MG tablet Take 500-1,000 mg by mouth every 6 (six) hours as needed for mild pain.    08/14/2020  . albuterol (VENTOLIN HFA) 108 (90 Base) MCG/ACT inhaler Inhale 2 puffs into the lungs every 6 (six) hours as needed for wheezing or shortness of breath. (Patient not taking: Reported on 08/24/2020)   Not Taking at Unknown time  . ASPERCREME W/LIDOCAINE EX Apply 1 application topically daily as needed (back pain). (Patient not taking: Reported on 08/24/2020)   Not Taking at Unknown time  . budesonide-formoterol (SYMBICORT) 160-4.5 MCG/ACT inhaler Inhale 2 puffs into the lungs 2 (two) times daily. (Patient not taking: Reported on 08/24/2020)   Not Taking at Unknown time  . docusate sodium (COLACE) 100 MG capsule Take 2 capsules (200 mg total) by mouth 2 (two) times daily. (Patient not taking: No sig reported) 120 capsule 3 Not Taking at Unknown time  . empagliflozin (JARDIANCE) 10 MG TABS tablet Take by mouth daily. (Patient not taking: Reported on 08/24/2020)   Not Taking at Unknown time  . HYDROcodone-acetaminophen (NORCO/VICODIN) 5-325 MG tablet Take 1 tablet by mouth daily as needed (pain).  (Patient not taking: Reported on 08/24/2020)   Not Taking at Unknown time  . insulin aspart (NOVOLOG) 100 UNIT/ML FlexPen Inject 0-6  Units into the skin 2 (two) times daily before a meal. Dose per sliding scale   08/22/2020  . Multiple Vitamin (MULTIVITAMIN WITH MINERALS) TABS tablet Take 1 tablet by mouth daily.   08/17/2020  . nitrofurantoin (MACRODANTIN) 100 MG capsule Take 1 capsule (100 mg total) by mouth 2 (two) times daily. (Patient not taking: Reported on 08/24/2020) 14 capsule 0 Not Taking at Unknown time  . ondansetron (ZOFRAN ODT) 8 MG disintegrating tablet Take 0.5 tablets (4 mg total) by mouth every 6 (six) hours as needed for nausea or vomiting. (Patient not taking: No sig reported) 4 tablet 3 Not Taking at Unknown  time  . Potassium Gluconate 550 (90 K) MG TABS Take 90 mg by mouth every other day.   08/22/2020  . pyridOXINE (B-6) 50 MG tablet Take 100 mg by mouth daily.  (Patient not taking: Reported on 08/24/2020)   Not Taking at Unknown time  . tamsulosin (FLOMAX) 0.4 MG CAPS capsule Take 2 capsules (0.8 mg total) by mouth daily. (Patient not taking: No sig reported) 180 capsule 3 Not Taking at Unknown time  . testosterone cypionate (DEPOTESTOSTERONE CYPIONATE) 200 MG/ML injection INJECT 200mg (1 ML) IN THE MUSCLE EVERY 28 DAYS     . warfarin (COUMADIN) 2 MG tablet Take 2 mg by mouth as directed.   08/20/2020  . warfarin (COUMADIN) 2.5 MG tablet Take 2.5 mg by mouth daily.   08/20/2020     Allergies  Allergen Reactions  . Levaquin [Levofloxacin] Swelling    Swelling of throat   . Ciprofloxacin Other (See Comments)    Makes him feel horrible, flu-like symptoms  . Lidocaine Other (See Comments)    IV infusion "caused problems"  . Metformin Other (See Comments)    Esophageal Spasms  . Tannic Acid Hives  . Tamsulosin Hcl Other (See Comments)    Cough, stomach pains, low blood pressure     Past Medical History:  Diagnosis Date  . A-fib ([place]) 2017  . AICD (automatic cardioverter/defibrillator) present   . Allergic state   . Anxiety   . Arrhythmia    atrial fib  . Bacteremia   . BPH (benign prostatic hyperplasia)   . CAD (coronary artery disease)   . Cancer ([facility])    skin of the nose and arm  . CHF (congestive heart failure) ([place])   . Clotting disorder ([place]) 2009  . Diabetes mellitus without complication ([place])   . Diverticulitis   . Family history of adverse reaction to anesthesia    daughter sleeps a long time  . H/O diverticulitis of colon   . Heart attack ([place]) 1985, 1995,2000, 2004,    multiple MI's  . Heart disease   . Heartburn   . History of kidney stones   . History of stomach ulcers   . HLD (hyperlipidemia)   . Hypertension   . Hypothyroidism   . Ischemic cardiomyopathy   .  Kidney stones   . Neuropathy   . Pacemaker   . Post herpetic neuralgia    left side of face  . Renal disorder    UTI's  . Rosacea   . Testosterone deficiency   . Tremors of nervous system   . Trigeminal neuralgia of left side of face   . Ventricular tachycardia ([place])     Review of systems:  Otherwise negative.    Physical Exam  Gen: Alert, oriented. Appears stated age.  HEENT: [place]/AT. PERRLA. Lungs: CTA, no wheezes. CV: RR nl S1, S2. Abd: soft, benign,  no masses. BS+ Ext: No edema. Pulses 2+    Planned procedures: Proceed with colonoscopy. The test (Cologuard) was used inappropriately and was not indicated in this patient. Nonetheless, patient would like to proceed with the colonoscopy.  The patient understands the nature of the planned procedure, indications, risks, alternatives and potential complications including but not limited to bleeding, infection, perforation, damage to internal organs and possible oversedation/side effects from anesthesia. The patient agrees and gives consent to proceed.  Please refer to procedure notes for findings, recommendations and patient disposition/instructions.     Javier Gell K. Alice Reichert, M.D. Gastroenterology 08/24/2020  11:39 AM

## 2020-08-24 NOTE — Op Note (Signed)
[facility] Patient Name: Dakota Thomas Procedure Date: 08/24/2020 11:38 AM MRN: [id number] Account #: [id number] Date of Birth: 09-Jan-1934 Admit Type: Outpatient Age: 87 Room: [facility] ENDO ROOM 2 Gender: Male Note Status: Finalized Procedure:             Colonoscopy Indications:           Positive Cologuard test Providers:             Benay Pike. Alice Reichert MD, MD Referring MD:          Tracie Harrier, MD (Referring MD) Medicines:             Propofol per Anesthesia Complications:         No immediate complications. Procedure:             Pre-Anesthesia Assessment:                        - The risks and benefits of the procedure and the                         sedation options and risks were discussed with the                         patient. All questions were answered and informed                         consent was obtained.                        - Patient identification and proposed procedure were                         verified prior to the procedure by the nurse. The                         procedure was verified in the procedure room.                        - ASA Grade Assessment: III - A patient with severe                         systemic disease.                        - After reviewing the risks and benefits, the patient                         was deemed in satisfactory condition to undergo the                         procedure.                        After obtaining informed consent, the colonoscope was                         passed under direct vision. Throughout the procedure,                         the patient's blood pressure, pulse, and oxygen  saturations were monitored continuously. The                         Colonoscope was introduced through the anus and                         advanced to the the cecum, identified by appendiceal                         orifice and ileocecal valve. The colonoscopy was                          performed without difficulty. The patient tolerated                         the procedure well. The quality of the bowel                         preparation was good. The ileocecal valve, appendiceal                         orifice, and rectum were photographed. Findings:      The perianal and digital rectal examinations were normal. Pertinent       negatives include normal sphincter tone and no palpable rectal lesions.      Non-bleeding internal hemorrhoids were found during retroflexion. The       hemorrhoids were Grade I (internal hemorrhoids that do not prolapse).      The colon (entire examined portion) appeared normal.      There is no endoscopic evidence of erythema, inflammation, mass, polyps       or stenosis in the entire colon. Impression:            - Non-bleeding internal hemorrhoids.                        - The entire examined colon is normal.                        - No specimens collected. Recommendation:        - Patient has a contact number available for                         emergencies. The signs and symptoms of potential                         delayed complications were discussed with the patient.                         Return to normal activities tomorrow. Written                         discharge instructions were provided to the patient.                        - Resume previous diet.                        - Continue present medications.                        -  No repeat colonoscopy due to current age (62 years                         or older) and the absence of advanced adenomas.                        - You do NOT require further colon cancer screening                         measures (Annual stool testing (i.e. hemoccult, FIT,                         cologuard), sigmoidoscopy, colonoscopy or CT                         colonography). You should share this recommendation                         with your Primary Care provider.                         - Return to GI office PRN.                        - The findings and recommendations were discussed with                         the patient. Procedure Code(s):     --- Professional ---                        [phone], Colonoscopy, flexible; diagnostic, including                         collection of specimen(s) by brushing or washing, when                         performed (separate procedure) Diagnosis Code(s):     --- Professional ---                        R19.5, Other fecal abnormalities                        K64.0, First degree hemorrhoids CPT copyright 2019 American Medical Association. All rights reserved. The codes documented in this report are preliminary and upon coder review may  be revised to meet current compliance requirements. Efrain Sella MD, MD 08/24/2020 12:12:06 PM This report has been signed electronically. Number of Addenda: 0 Note Initiated On: 08/24/2020 11:38 AM Scope Withdrawal Time: 0 hours 9 minutes 30 seconds  Total Procedure Duration: 0 hours 12 minutes 40 seconds  Estimated Blood Loss:  Estimated blood loss: none.      [facility]

## 2020-08-24 NOTE — Anesthesia Postprocedure Evaluation (Signed)
Anesthesia Post Note  Patient: TREBOR GALDAMEZ  Procedure(s) Performed: COLONOSCOPY WITH PROPOFOL (N/A )  Patient location during evaluation: Endoscopy Anesthesia Type: General Level of consciousness: awake and alert Pain management: pain level controlled Vital Signs Assessment: post-procedure vital signs reviewed and stable Respiratory status: spontaneous breathing, nonlabored ventilation, respiratory function stable and patient connected to nasal cannula oxygen Cardiovascular status: blood pressure returned to baseline and stable Postop Assessment: no apparent nausea or vomiting Anesthetic complications: no   No complications documented.   Last Vitals:  Vitals:   08/24/20 1230 08/24/20 1240  BP: 111/70 134/74  Pulse: (!) 57 (!) 55  Resp: 18 12  Temp:    SpO2: 97% 96%    Last Pain:  Vitals:   08/24/20 1240  TempSrc:   PainSc: 0-No pain                 Precious Haws Jahni Paul

## 2020-08-24 NOTE — Transfer of Care (Signed)
Immediate Anesthesia Transfer of Care Note  Patient: HAREL REPETTO  Procedure(s) Performed: COLONOSCOPY WITH PROPOFOL (N/A )  Patient Location: Endoscopy Unit  Anesthesia Type:General  Level of Consciousness: drowsy and patient cooperative  Airway & Oxygen Therapy: Patient Spontanous Breathing and Patient connected to face mask oxygen  Post-op Assessment: Report given to RN and Post -op Vital signs reviewed and stable  Post vital signs: Reviewed and stable  Last Vitals:  Vitals Value Taken Time  BP    Temp    Pulse 58 08/24/20 1212  Resp 13 08/24/20 1212  SpO2 100 % 08/24/20 1212  Vitals shown include unvalidated device data.  Last Pain:  Vitals:   08/24/20 1028  TempSrc: Temporal  PainSc: 0-No pain         Complications: No complications documented.

## 2020-09-08 ENCOUNTER — Other Ambulatory Visit: Payer: Self-pay | Admitting: Internal Medicine

## 2020-09-08 DIAGNOSIS — R1314 Dysphagia, pharyngoesophageal phase: Secondary | ICD-10-CM

## 2020-09-08 DIAGNOSIS — I5022 Chronic systolic (congestive) heart failure: Secondary | ICD-10-CM | POA: Diagnosis not present

## 2020-09-08 DIAGNOSIS — Z95810 Presence of automatic (implantable) cardiac defibrillator: Secondary | ICD-10-CM | POA: Diagnosis not present

## 2020-09-08 DIAGNOSIS — R1012 Left upper quadrant pain: Secondary | ICD-10-CM | POA: Diagnosis not present

## 2020-09-08 DIAGNOSIS — I480 Paroxysmal atrial fibrillation: Secondary | ICD-10-CM | POA: Diagnosis not present

## 2020-09-19 ENCOUNTER — Ambulatory Visit
Admission: RE | Admit: 2020-09-19 | Discharge: 2020-09-19 | Disposition: A | Discharge status: Home / Self Care | Payer: Medicare HMO | Source: Ambulatory Visit | Attending: Internal Medicine | Admitting: Internal Medicine

## 2020-09-19 ENCOUNTER — Other Ambulatory Visit: Payer: Self-pay

## 2020-09-19 DIAGNOSIS — R1314 Dysphagia, pharyngoesophageal phase: Secondary | ICD-10-CM | POA: Diagnosis not present

## 2020-09-19 DIAGNOSIS — R1010 Upper abdominal pain, unspecified: Secondary | ICD-10-CM | POA: Diagnosis not present

## 2020-10-06 DIAGNOSIS — I480 Paroxysmal atrial fibrillation: Secondary | ICD-10-CM | POA: Diagnosis not present

## 2020-10-19 DIAGNOSIS — E1140 Type 2 diabetes mellitus with diabetic neuropathy, unspecified: Secondary | ICD-10-CM | POA: Diagnosis not present

## 2020-10-19 DIAGNOSIS — I25810 Atherosclerosis of coronary artery bypass graft(s) without angina pectoris: Secondary | ICD-10-CM | POA: Diagnosis not present

## 2020-10-19 DIAGNOSIS — I5022 Chronic systolic (congestive) heart failure: Secondary | ICD-10-CM | POA: Diagnosis not present

## 2020-10-19 DIAGNOSIS — E349 Endocrine disorder, unspecified: Secondary | ICD-10-CM | POA: Diagnosis not present

## 2020-10-19 DIAGNOSIS — R195 Other fecal abnormalities: Secondary | ICD-10-CM | POA: Diagnosis not present

## 2020-10-19 DIAGNOSIS — Z794 Long term (current) use of insulin: Secondary | ICD-10-CM | POA: Diagnosis not present

## 2020-10-19 DIAGNOSIS — I480 Paroxysmal atrial fibrillation: Secondary | ICD-10-CM | POA: Diagnosis not present

## 2020-10-19 DIAGNOSIS — Z95810 Presence of automatic (implantable) cardiac defibrillator: Secondary | ICD-10-CM | POA: Diagnosis not present

## 2020-10-27 DIAGNOSIS — I480 Paroxysmal atrial fibrillation: Secondary | ICD-10-CM | POA: Diagnosis not present

## 2020-10-27 DIAGNOSIS — Z0000 Encounter for general adult medical examination without abnormal findings: Secondary | ICD-10-CM | POA: Diagnosis not present

## 2020-10-27 DIAGNOSIS — E119 Type 2 diabetes mellitus without complications: Secondary | ICD-10-CM | POA: Diagnosis not present

## 2020-10-27 DIAGNOSIS — I10 Essential (primary) hypertension: Secondary | ICD-10-CM | POA: Diagnosis not present

## 2020-10-27 DIAGNOSIS — Z794 Long term (current) use of insulin: Secondary | ICD-10-CM | POA: Diagnosis not present

## 2020-10-27 DIAGNOSIS — I472 Ventricular tachycardia: Secondary | ICD-10-CM | POA: Diagnosis not present

## 2020-10-27 DIAGNOSIS — J449 Chronic obstructive pulmonary disease, unspecified: Secondary | ICD-10-CM | POA: Diagnosis not present

## 2020-10-27 DIAGNOSIS — I255 Ischemic cardiomyopathy: Secondary | ICD-10-CM | POA: Diagnosis not present

## 2020-10-27 DIAGNOSIS — E785 Hyperlipidemia, unspecified: Secondary | ICD-10-CM | POA: Diagnosis not present

## 2020-11-04 DIAGNOSIS — S51811A Laceration without foreign body of right forearm, initial encounter: Secondary | ICD-10-CM | POA: Diagnosis not present

## 2020-11-08 DIAGNOSIS — I480 Paroxysmal atrial fibrillation: Secondary | ICD-10-CM | POA: Diagnosis not present

## 2020-11-14 ENCOUNTER — Other Ambulatory Visit: Payer: Self-pay

## 2020-11-14 ENCOUNTER — Ambulatory Visit: Payer: Medicare HMO | Admitting: Dermatology

## 2020-11-14 ENCOUNTER — Encounter: Payer: Self-pay | Admitting: Dermatology

## 2020-11-14 DIAGNOSIS — L719 Rosacea, unspecified: Secondary | ICD-10-CM | POA: Diagnosis not present

## 2020-11-14 DIAGNOSIS — L814 Other melanin hyperpigmentation: Secondary | ICD-10-CM | POA: Diagnosis not present

## 2020-11-14 DIAGNOSIS — L820 Inflamed seborrheic keratosis: Secondary | ICD-10-CM | POA: Diagnosis not present

## 2020-11-14 DIAGNOSIS — L570 Actinic keratosis: Secondary | ICD-10-CM

## 2020-11-14 DIAGNOSIS — L821 Other seborrheic keratosis: Secondary | ICD-10-CM | POA: Diagnosis not present

## 2020-11-14 DIAGNOSIS — D229 Melanocytic nevi, unspecified: Secondary | ICD-10-CM

## 2020-11-14 DIAGNOSIS — D1800 Hemangioma unspecified site: Secondary | ICD-10-CM

## 2020-11-14 DIAGNOSIS — E119 Type 2 diabetes mellitus without complications: Secondary | ICD-10-CM

## 2020-11-14 DIAGNOSIS — Z85828 Personal history of other malignant neoplasm of skin: Secondary | ICD-10-CM | POA: Diagnosis not present

## 2020-11-14 DIAGNOSIS — L578 Other skin changes due to chronic exposure to nonionizing radiation: Secondary | ICD-10-CM

## 2020-11-14 DIAGNOSIS — Z1283 Encounter for screening for malignant neoplasm of skin: Secondary | ICD-10-CM

## 2020-11-14 NOTE — Progress Notes (Addendum)
Follow-Up Visit   Subjective  Dakota Thomas is a 87 y.o. male who presents for the following: Follow-up (Patient would like nose checked, 2 spots on right cheek, 6 spots on chest he would like froze today. Patient would like a spot on back his wife noticed. Patient here today for tbse. He has a history of bcc on left mid helix, right distal dorsum nose and left infra auricular. ). Patient here for full body skin exam and skin cancer screening.  The following portions of the chart were reviewed this encounter and updated as appropriate:  Tobacco  Allergies  Meds  Problems  Med Hx  Surg Hx  Fam Hx     Objective  Well appearing patient in no apparent distress; mood and affect are within normal limits.  A full examination was performed including scalp, head, eyes, ears, nose, lips, neck, chest, axillae, abdomen, back, buttocks, bilateral upper extremities, bilateral lower extremities, hands, feet, fingers, toes, fingernails, and toenails. All findings within normal limits unless otherwise noted below.  Objective  bilateral shoulders and chest x 20 (20): Erythematous keratotic or waxy stuck-on papule or plaque.   Objective  face / ears x 17 (17): Erythematous thin papules/macules with gritty scale.   Objective  face: Mid face erythema with telangiectasias +/- scattered inflammatory papules.   Assessment & Plan  Inflamed seborrheic keratosis (20) bilateral shoulders and chest x 20 Prior to procedure, discussed risks of blister formation, small wound, skin dyspigmentation, or rare scar following cryotherapy.   Destruction of lesion - bilateral shoulders and chest x 20 Complexity: simple   Destruction method: cryotherapy   Informed consent: discussed and consent obtained   Timeout:  patient name, date of birth, surgical site, and procedure verified Lesion destroyed using liquid nitrogen: Yes   Region frozen until ice ball extended beyond lesion: Yes   Outcome: patient  tolerated procedure well with no complications   Post-procedure details: wound care instructions given    Actinic keratosis (17) face / ears x 17 Prior to procedure, discussed risks of blister formation, small wound, skin dyspigmentation, or rare scar following cryotherapy.   Destruction of lesion - face / ears x 17 Complexity: simple   Destruction method: cryotherapy   Informed consent: discussed and consent obtained   Timeout:  patient name, date of birth, surgical site, and procedure verified Lesion destroyed using liquid nitrogen: Yes   Region frozen until ice ball extended beyond lesion: Yes   Outcome: patient tolerated procedure well with no complications   Post-procedure details: wound care instructions given    Rosacea face Rosacea is a chronic progressive skin condition usually affecting the face of adults, causing redness and/or acne bumps. It is treatable but not curable. It sometimes affects the eyes (ocular rosacea) as well. It may respond to topical and/or systemic medication and can flare with stress, sun exposure, alcohol, exercise and some foods.  Daily application of broad spectrum spf 30+ sunscreen to face is recommended to reduce flares.  Skin cancer screening  Lentigines - Scattered tan macules - Due to sun exposure - Benign-appering, observe - Recommend daily broad spectrum sunscreen SPF 30+ to sun-exposed areas, reapply every 2 hours as needed. - Call for any changes  Seborrheic Keratoses - Stuck-on, waxy, tan-brown papules and/or plaques  - Benign-appearing - Discussed benign etiology and prognosis. - Observe - Call for any changes  Melanocytic Nevi - Tan-brown and/or pink-flesh-colored symmetric macules and papules - Benign appearing on exam today - Observation - Call  clinic for new or changing moles - Recommend daily use of broad spectrum spf 30+ sunscreen to sun-exposed areas.   Hemangiomas - Red papules - Discussed benign nature - Observe -  Call for any changes  Actinic Damage - Severe, confluent actinic changes with pre-cancerous actinic keratoses  - Severe, chronic, not at goal, secondary to cumulative UV radiation exposure over time - diffuse scaly erythematous macules and papules with underlying dyspigmentation - Discussed Prescription "Field Treatment" for Severe, Chronic Confluent Actinic Changes with Pre-Cancerous Actinic Keratoses Field treatment involves treatment of an entire area of skin that has confluent Actinic Changes (Sun/ Ultraviolet light damage) and PreCancerous Actinic Keratoses by method of PhotoDynamic Therapy (PDT) and/or prescription Topical Chemotherapy agents such as 5-fluorouracil, 5-fluorouracil/calcipotriene, and/or imiquimod.  The purpose is to decrease the number of clinically evident and subclinical PreCancerous lesions to prevent progression to development of skin cancer by chemically destroying early precancer changes that may or may not be visible.  It has been shown to reduce the risk of developing skin cancer in the treated area. As a result of treatment, redness, scaling, crusting, and open sores may occur during treatment course. One or more than one of these methods may be used and may have to be used several times to control, suppress and eliminate the PreCancerous changes. Discussed treatment course, expected reaction, and possible side effects. - Recommend daily broad spectrum sunscreen SPF 30+ to sun-exposed areas, reapply every 2 hours as needed.  - Staying in the shade or wearing long sleeves, sun glasses (UVA+UVB protection) and wide brim hats (4-inch brim around the entire circumference of the hat) are also recommended. - Call for new or changing lesions.  History of Basal Cell Carcinoma of the Skin - No evidence of recurrence today on left mid helix (2011), right distal dorsum nose (2017), left infra auricular (2018) - Recommend regular full body skin exams - Recommend daily broad spectrum  sunscreen SPF 30+ to sun-exposed areas, reapply every 2 hours as needed.  - Call if any new or changing lesions are noted between office visits  Skin cancer screening performed today.  Return in about 4 months (around 03/17/2021) for ak followup.  IRuthell Rummage, CMA, am acting as scribe for Sarina Ser, MD.  Documentation: I have reviewed the above documentation for accuracy and completeness, and I agree with the above.  Sarina Ser, MD

## 2020-11-14 NOTE — Patient Instructions (Addendum)
Actinic keratoses are precancerous spots that appear secondary to cumulative UV radiation exposure/sun exposure over time. They are chronic with expected duration over 1 year. A portion of actinic keratoses will progress to squamous cell carcinoma of the skin. It is not possible to reliably predict which spots will progress to skin cancer and so treatment is recommended to prevent development of skin cancer.  Recommend daily broad spectrum sunscreen SPF 30+ to sun-exposed areas, reapply every 2 hours as needed.  Recommend staying in the shade or wearing long sleeves, sun glasses (UVA+UVB protection) and wide brim hats (4-inch brim around the entire circumference of the hat). Call for new or changing lesions.   Cryotherapy Aftercare  Wash gently with soap and water everyday.   Apply Vaseline and Band-Aid daily until healed.   If you have any questions or concerns for your doctor, please call our main line at [phone] and press option 4 to reach your doctor's medical assistant. If no one answers, please leave a voicemail as directed and we will return your call as soon as possible. Messages left after 4 pm will be answered the following business day.   You may also send us a message via MyChart. We typically respond to MyChart messages within 1-2 business days.  For prescription refills, please ask your pharmacy to contact our office. Our fax number is [phone].  If you have an urgent issue when the clinic is closed that cannot wait until the next business day, you can page your doctor at the number below.    Please note that while we do our best to be available for urgent issues outside of office hours, we are not available 24/7.   If you have an urgent issue and are unable to reach us, you may choose to seek medical care at your doctor's office, retail clinic, urgent care center, or emergency room.  If you have a medical emergency, please immediately call 911 or go to the emergency  department.  Pager Numbers  - Dr. Kowalski: [phone]  - Dr. Moye: [phone]  - Dr. Stewart: [phone]  In the event of inclement weather, please call our main line at [phone] for an update on the status of any delays or closures.  Dermatology Medication Tips: Please keep the boxes that topical medications come in in order to help keep track of the instructions about where and how to use these. Pharmacies typically print the medication instructions only on the boxes and not directly on the medication tubes.   If your medication is too expensive, please contact our office at [phone] option 4 or send us a message through MyChart.   We are unable to tell what your co-pay for medications will be in advance as this is different depending on your insurance coverage. However, we may be able to find a substitute medication at lower cost or fill out paperwork to get insurance to cover a needed medication.   If a prior authorization is required to get your medication covered by your insurance company, please allow us 1-2 business days to complete this process.  Drug prices often vary depending on where the prescription is filled and some pharmacies may offer cheaper prices.  The website www.goodrx.com contains coupons for medications through different pharmacies. The prices here do not account for what the cost may be with help from insurance (it may be cheaper with your insurance), but the website can give you the price if you did not use any insurance.  - You   can print the associated coupon and take it with your prescription to the pharmacy.  - You may also stop by our office during regular business hours and pick up a GoodRx coupon card.  - If you need your prescription sent electronically to a different pharmacy, notify our office through [place] MyChart or by phone at [phone] option 4.  

## 2020-11-16 ENCOUNTER — Encounter: Payer: Self-pay | Admitting: Dermatology

## 2020-11-22 NOTE — Addendum Note (Signed)
Addended by: Nehemiah Massed, Mahathi Pokorney C on: 11/22/2020 10:30 AM   Modules accepted: Level of Service

## 2020-12-21 DIAGNOSIS — I480 Paroxysmal atrial fibrillation: Secondary | ICD-10-CM | POA: Diagnosis not present

## 2021-01-10 DIAGNOSIS — I480 Paroxysmal atrial fibrillation: Secondary | ICD-10-CM | POA: Diagnosis not present

## 2021-01-24 DIAGNOSIS — I5022 Chronic systolic (congestive) heart failure: Secondary | ICD-10-CM | POA: Diagnosis not present

## 2021-02-13 DIAGNOSIS — I480 Paroxysmal atrial fibrillation: Secondary | ICD-10-CM | POA: Diagnosis not present

## 2021-02-13 DIAGNOSIS — I255 Ischemic cardiomyopathy: Secondary | ICD-10-CM | POA: Diagnosis not present

## 2021-02-13 DIAGNOSIS — Z794 Long term (current) use of insulin: Secondary | ICD-10-CM | POA: Diagnosis not present

## 2021-02-13 DIAGNOSIS — E1140 Type 2 diabetes mellitus with diabetic neuropathy, unspecified: Secondary | ICD-10-CM | POA: Diagnosis not present

## 2021-02-13 DIAGNOSIS — R251 Tremor, unspecified: Secondary | ICD-10-CM | POA: Diagnosis not present

## 2021-02-13 DIAGNOSIS — E782 Mixed hyperlipidemia: Secondary | ICD-10-CM | POA: Diagnosis not present

## 2021-02-13 DIAGNOSIS — Z95810 Presence of automatic (implantable) cardiac defibrillator: Secondary | ICD-10-CM | POA: Diagnosis not present

## 2021-02-13 DIAGNOSIS — I10 Essential (primary) hypertension: Secondary | ICD-10-CM | POA: Diagnosis not present

## 2021-02-13 DIAGNOSIS — Z7901 Long term (current) use of anticoagulants: Secondary | ICD-10-CM | POA: Diagnosis not present

## 2021-02-13 DIAGNOSIS — I25810 Atherosclerosis of coronary artery bypass graft(s) without angina pectoris: Secondary | ICD-10-CM | POA: Diagnosis not present

## 2021-02-23 DIAGNOSIS — I10 Essential (primary) hypertension: Secondary | ICD-10-CM | POA: Diagnosis not present

## 2021-02-23 DIAGNOSIS — Z79899 Other long term (current) drug therapy: Secondary | ICD-10-CM | POA: Diagnosis not present

## 2021-02-23 DIAGNOSIS — I5022 Chronic systolic (congestive) heart failure: Secondary | ICD-10-CM | POA: Diagnosis not present

## 2021-02-23 DIAGNOSIS — I480 Paroxysmal atrial fibrillation: Secondary | ICD-10-CM | POA: Diagnosis not present

## 2021-02-23 DIAGNOSIS — I513 Intracardiac thrombosis, not elsewhere classified: Secondary | ICD-10-CM | POA: Diagnosis not present

## 2021-02-23 DIAGNOSIS — I25810 Atherosclerosis of coronary artery bypass graft(s) without angina pectoris: Secondary | ICD-10-CM | POA: Diagnosis not present

## 2021-03-02 DIAGNOSIS — Z20822 Contact with and (suspected) exposure to covid-19: Secondary | ICD-10-CM | POA: Diagnosis not present

## 2021-03-02 DIAGNOSIS — Z03818 Encounter for observation for suspected exposure to other biological agents ruled out: Secondary | ICD-10-CM | POA: Diagnosis not present

## 2021-03-02 DIAGNOSIS — R0602 Shortness of breath: Secondary | ICD-10-CM | POA: Diagnosis not present

## 2021-03-07 DIAGNOSIS — I255 Ischemic cardiomyopathy: Secondary | ICD-10-CM | POA: Diagnosis not present

## 2021-03-07 DIAGNOSIS — R251 Tremor, unspecified: Secondary | ICD-10-CM | POA: Diagnosis not present

## 2021-03-07 DIAGNOSIS — E039 Hypothyroidism, unspecified: Secondary | ICD-10-CM | POA: Diagnosis not present

## 2021-03-07 DIAGNOSIS — I480 Paroxysmal atrial fibrillation: Secondary | ICD-10-CM | POA: Diagnosis not present

## 2021-03-07 DIAGNOSIS — Z794 Long term (current) use of insulin: Secondary | ICD-10-CM | POA: Diagnosis not present

## 2021-03-07 DIAGNOSIS — E1140 Type 2 diabetes mellitus with diabetic neuropathy, unspecified: Secondary | ICD-10-CM | POA: Diagnosis not present

## 2021-03-07 DIAGNOSIS — J449 Chronic obstructive pulmonary disease, unspecified: Secondary | ICD-10-CM | POA: Diagnosis not present

## 2021-03-07 DIAGNOSIS — Z95810 Presence of automatic (implantable) cardiac defibrillator: Secondary | ICD-10-CM | POA: Diagnosis not present

## 2021-03-07 DIAGNOSIS — I472 Ventricular tachycardia: Secondary | ICD-10-CM | POA: Diagnosis not present

## 2021-03-22 ENCOUNTER — Ambulatory Visit: Payer: Medicare HMO | Admitting: Dermatology

## 2021-03-22 ENCOUNTER — Other Ambulatory Visit: Payer: Self-pay

## 2021-03-22 DIAGNOSIS — Z1283 Encounter for screening for malignant neoplasm of skin: Secondary | ICD-10-CM | POA: Diagnosis not present

## 2021-03-22 DIAGNOSIS — L570 Actinic keratosis: Secondary | ICD-10-CM | POA: Diagnosis not present

## 2021-03-22 DIAGNOSIS — D229 Melanocytic nevi, unspecified: Secondary | ICD-10-CM | POA: Diagnosis not present

## 2021-03-22 DIAGNOSIS — L820 Inflamed seborrheic keratosis: Secondary | ICD-10-CM

## 2021-03-22 DIAGNOSIS — L821 Other seborrheic keratosis: Secondary | ICD-10-CM | POA: Diagnosis not present

## 2021-03-22 DIAGNOSIS — I513 Intracardiac thrombosis, not elsewhere classified: Secondary | ICD-10-CM | POA: Diagnosis not present

## 2021-03-22 DIAGNOSIS — D1800 Hemangioma unspecified site: Secondary | ICD-10-CM

## 2021-03-22 DIAGNOSIS — L814 Other melanin hyperpigmentation: Secondary | ICD-10-CM | POA: Diagnosis not present

## 2021-03-22 DIAGNOSIS — I480 Paroxysmal atrial fibrillation: Secondary | ICD-10-CM | POA: Diagnosis not present

## 2021-03-22 DIAGNOSIS — L578 Other skin changes due to chronic exposure to nonionizing radiation: Secondary | ICD-10-CM | POA: Diagnosis not present

## 2021-03-22 NOTE — Progress Notes (Signed)
Follow-Up Visit   Subjective  Dakota Thomas is a 87 y.o. male who presents for the following: Actinic Keratosis (4 months f/u on Aks on face and shoulders treated with LN2 ). The patient presents for Upper Body Skin Exam (UBSE) for skin cancer screening and mole check.   The following portions of the chart were reviewed this encounter and updated as appropriate:   Tobacco  Allergies  Meds  Problems  Med Hx  Surg Hx  Fam Hx     Review of Systems:  No other skin or systemic complaints except as noted in HPI or Assessment and Plan.  Objective  Well appearing patient in no apparent distress; mood and affect are within normal limits.  A focused examination was performed including face,chest,back. Relevant physical exam findings are noted in the Assessment and Plan.  Nose (2) Erythematous thin papules/macules with gritty scale.   right temple, right forehead, left shoulder (3) Erythematous keratotic or waxy stuck-on papule or plaque.    Assessment & Plan  AK (actinic keratosis) (2) Nose  Destruction of lesion - Nose Complexity: simple   Destruction method: cryotherapy   Informed consent: discussed and consent obtained   Timeout:  patient name, date of birth, surgical site, and procedure verified Lesion destroyed using liquid nitrogen: Yes   Region frozen until ice ball extended beyond lesion: Yes   Outcome: patient tolerated procedure well with no complications   Post-procedure details: wound care instructions given    Inflamed seborrheic keratosis right temple, right forehead, left shoulder (3)  Destruction of lesion - right temple, right forehead, left shoulder (3) Complexity: simple   Destruction method: cryotherapy   Informed consent: discussed and consent obtained   Timeout:  patient name, date of birth, surgical site, and procedure verified Lesion destroyed using liquid nitrogen: Yes   Region frozen until ice ball extended beyond lesion: Yes   Outcome:  patient tolerated procedure well with no complications   Post-procedure details: wound care instructions given    Skin cancer screening  Lentigines - Scattered tan macules - Due to sun exposure - Benign-appering, observe - Recommend daily broad spectrum sunscreen SPF 30+ to sun-exposed areas, reapply every 2 hours as needed. - Call for any changes  Seborrheic Keratoses - Stuck-on, waxy, tan-brown papules and/or plaques  - Benign-appearing - Discussed benign etiology and prognosis. - Observe - Call for any changes  Melanocytic Nevi - Tan-brown and/or pink-flesh-colored symmetric macules and papules - Benign appearing on exam today - Observation - Call clinic for new or changing moles - Recommend daily use of broad spectrum spf 30+ sunscreen to sun-exposed areas.   Hemangiomas - Red papules - Discussed benign nature - Observe - Call for any changes  Actinic Damage - Chronic condition, secondary to cumulative UV/sun exposure - diffuse scaly erythematous macules with underlying dyspigmentation - Recommend daily broad spectrum sunscreen SPF 30+ to sun-exposed areas, reapply every 2 hours as needed.  - Staying in the shade or wearing long sleeves, sun glasses (UVA+UVB protection) and wide brim hats (4-inch brim around the entire circumference of the hat) are also recommended for sun protection.  - Call for new or changing lesions.  Skin cancer screening performed today.    Return in about 1 year (around 03/22/2022) for TBSE .  IMarye Round, CMA, am acting as scribe for Sarina Ser, MD .  Documentation: I have reviewed the above documentation for accuracy and completeness, and I agree with the above.  Sarina Ser, MD

## 2021-03-22 NOTE — Patient Instructions (Addendum)

## 2021-03-23 ENCOUNTER — Encounter: Payer: Self-pay | Admitting: Dermatology

## 2021-04-10 DIAGNOSIS — I480 Paroxysmal atrial fibrillation: Secondary | ICD-10-CM | POA: Diagnosis not present

## 2021-04-10 DIAGNOSIS — I5022 Chronic systolic (congestive) heart failure: Secondary | ICD-10-CM | POA: Diagnosis not present

## 2021-04-10 DIAGNOSIS — I10 Essential (primary) hypertension: Secondary | ICD-10-CM | POA: Diagnosis not present

## 2021-04-10 DIAGNOSIS — I25810 Atherosclerosis of coronary artery bypass graft(s) without angina pectoris: Secondary | ICD-10-CM | POA: Diagnosis not present

## 2021-04-10 DIAGNOSIS — I472 Ventricular tachycardia: Secondary | ICD-10-CM | POA: Diagnosis not present

## 2021-04-10 DIAGNOSIS — I513 Intracardiac thrombosis, not elsewhere classified: Secondary | ICD-10-CM | POA: Diagnosis not present

## 2021-04-10 DIAGNOSIS — I340 Nonrheumatic mitral (valve) insufficiency: Secondary | ICD-10-CM | POA: Diagnosis not present

## 2021-04-10 DIAGNOSIS — Z95810 Presence of automatic (implantable) cardiac defibrillator: Secondary | ICD-10-CM | POA: Diagnosis not present

## 2021-04-10 DIAGNOSIS — Z7901 Long term (current) use of anticoagulants: Secondary | ICD-10-CM | POA: Diagnosis not present

## 2021-04-10 DIAGNOSIS — I255 Ischemic cardiomyopathy: Secondary | ICD-10-CM | POA: Diagnosis not present

## 2021-04-25 DIAGNOSIS — I5022 Chronic systolic (congestive) heart failure: Secondary | ICD-10-CM | POA: Diagnosis not present

## 2021-04-30 DIAGNOSIS — Z9049 Acquired absence of other specified parts of digestive tract: Secondary | ICD-10-CM | POA: Diagnosis not present

## 2021-04-30 DIAGNOSIS — R0789 Other chest pain: Secondary | ICD-10-CM | POA: Diagnosis not present

## 2021-04-30 DIAGNOSIS — R0781 Pleurodynia: Secondary | ICD-10-CM | POA: Diagnosis not present

## 2021-05-09 DIAGNOSIS — Z7901 Long term (current) use of anticoagulants: Secondary | ICD-10-CM | POA: Diagnosis not present

## 2021-05-30 DIAGNOSIS — R791 Abnormal coagulation profile: Secondary | ICD-10-CM | POA: Diagnosis not present

## 2021-05-31 DIAGNOSIS — Z0100 Encounter for examination of eyes and vision without abnormal findings: Secondary | ICD-10-CM | POA: Diagnosis not present

## 2021-06-04 ENCOUNTER — Observation Stay
Admission: EM | Admit: 2021-06-04 | Discharge: 2021-06-05 | Disposition: A | Discharge status: Home / Self Care | Payer: Medicare HMO | Attending: Internal Medicine | Admitting: Internal Medicine

## 2021-06-04 ENCOUNTER — Other Ambulatory Visit: Payer: Self-pay

## 2021-06-04 ENCOUNTER — Emergency Department: Payer: Medicare HMO

## 2021-06-04 DIAGNOSIS — N400 Enlarged prostate without lower urinary tract symptoms: Secondary | ICD-10-CM | POA: Diagnosis present

## 2021-06-04 DIAGNOSIS — I472 Ventricular tachycardia, unspecified: Principal | ICD-10-CM | POA: Insufficient documentation

## 2021-06-04 DIAGNOSIS — R55 Syncope and collapse: Secondary | ICD-10-CM | POA: Insufficient documentation

## 2021-06-04 DIAGNOSIS — Z79899 Other long term (current) drug therapy: Secondary | ICD-10-CM | POA: Diagnosis not present

## 2021-06-04 DIAGNOSIS — I4729 Other ventricular tachycardia: Secondary | ICD-10-CM | POA: Diagnosis present

## 2021-06-04 DIAGNOSIS — I10 Essential (primary) hypertension: Secondary | ICD-10-CM | POA: Insufficient documentation

## 2021-06-04 DIAGNOSIS — R0602 Shortness of breath: Secondary | ICD-10-CM | POA: Diagnosis not present

## 2021-06-04 DIAGNOSIS — J069 Acute upper respiratory infection, unspecified: Secondary | ICD-10-CM

## 2021-06-04 DIAGNOSIS — I959 Hypotension, unspecified: Secondary | ICD-10-CM | POA: Diagnosis not present

## 2021-06-04 DIAGNOSIS — I513 Intracardiac thrombosis, not elsewhere classified: Secondary | ICD-10-CM

## 2021-06-04 DIAGNOSIS — Z85828 Personal history of other malignant neoplasm of skin: Secondary | ICD-10-CM | POA: Insufficient documentation

## 2021-06-04 DIAGNOSIS — E039 Hypothyroidism, unspecified: Secondary | ICD-10-CM | POA: Insufficient documentation

## 2021-06-04 DIAGNOSIS — I5022 Chronic systolic (congestive) heart failure: Secondary | ICD-10-CM | POA: Insufficient documentation

## 2021-06-04 DIAGNOSIS — M549 Dorsalgia, unspecified: Secondary | ICD-10-CM | POA: Diagnosis not present

## 2021-06-04 DIAGNOSIS — R000 Tachycardia, unspecified: Secondary | ICD-10-CM | POA: Diagnosis not present

## 2021-06-04 DIAGNOSIS — Z794 Long term (current) use of insulin: Secondary | ICD-10-CM | POA: Diagnosis not present

## 2021-06-04 DIAGNOSIS — R531 Weakness: Secondary | ICD-10-CM | POA: Diagnosis present

## 2021-06-04 DIAGNOSIS — U071 COVID-19: Secondary | ICD-10-CM | POA: Diagnosis not present

## 2021-06-04 DIAGNOSIS — R42 Dizziness and giddiness: Secondary | ICD-10-CM | POA: Diagnosis not present

## 2021-06-04 DIAGNOSIS — I4891 Unspecified atrial fibrillation: Secondary | ICD-10-CM | POA: Diagnosis not present

## 2021-06-04 DIAGNOSIS — E119 Type 2 diabetes mellitus without complications: Secondary | ICD-10-CM | POA: Diagnosis not present

## 2021-06-04 DIAGNOSIS — I213 ST elevation (STEMI) myocardial infarction of unspecified site: Secondary | ICD-10-CM | POA: Diagnosis not present

## 2021-06-04 DIAGNOSIS — E1165 Type 2 diabetes mellitus with hyperglycemia: Secondary | ICD-10-CM | POA: Diagnosis not present

## 2021-06-04 DIAGNOSIS — Z87891 Personal history of nicotine dependence: Secondary | ICD-10-CM | POA: Insufficient documentation

## 2021-06-04 DIAGNOSIS — I110 Hypertensive heart disease with heart failure: Secondary | ICD-10-CM | POA: Insufficient documentation

## 2021-06-04 DIAGNOSIS — I2510 Atherosclerotic heart disease of native coronary artery without angina pectoris: Secondary | ICD-10-CM | POA: Diagnosis present

## 2021-06-04 LAB — CBC WITH DIFFERENTIAL/PLATELET
Abs Immature Granulocytes: 0.03 10*3/uL (ref 0.00–0.07)
Basophils Absolute: 0 10*3/uL (ref 0.0–0.1)
Basophils Relative: 0 %
Eosinophils Absolute: 0 10*3/uL (ref 0.0–0.5)
Eosinophils Relative: 0 %
HCT: 48.3 % (ref 39.0–52.0)
Hemoglobin: 15.9 g/dL (ref 13.0–17.0)
Immature Granulocytes: 0 %
Lymphocytes Relative: 11 %
Lymphs Abs: 0.8 10*3/uL (ref 0.7–4.0)
MCH: 32.1 pg (ref 26.0–34.0)
MCHC: 32.9 g/dL (ref 30.0–36.0)
MCV: 97.6 fL (ref 80.0–100.0)
Monocytes Absolute: 1 10*3/uL (ref 0.1–1.0)
Monocytes Relative: 14 %
Neutro Abs: 5.3 10*3/uL (ref 1.7–7.7)
Neutrophils Relative %: 75 %
Platelets: 119 10*3/uL — ABNORMAL LOW (ref 150–400)
RBC: 4.95 MIL/uL (ref 4.22–5.81)
RDW: 13.6 % (ref 11.5–15.5)
WBC: 7.2 10*3/uL (ref 4.0–10.5)
nRBC: 0 % (ref 0.0–0.2)

## 2021-06-04 LAB — URINALYSIS, ROUTINE W REFLEX MICROSCOPIC
Bacteria, UA: NONE SEEN
Bilirubin Urine: NEGATIVE
Glucose, UA: >=500 mg/dL — AB
Hgb urine dipstick: NEGATIVE
Ketones, ur: 20 mg/dL — AB
Leukocytes,Ua: NEGATIVE
Nitrite: NEGATIVE
Protein, ur: NEGATIVE mg/dL
Specific Gravity, Urine: 1.026 (ref 1.005–1.030)
pH: 6 (ref 5.0–8.0)

## 2021-06-04 LAB — COMPREHENSIVE METABOLIC PANEL
ALT: 21 U/L (ref 0–44)
AST: 34 U/L (ref 15–41)
Albumin: 4.3 g/dL (ref 3.5–5.0)
Alkaline Phosphatase: 76 U/L (ref 38–126)
Anion gap: 11 (ref 5–15)
BUN: 14 mg/dL (ref 8–23)
CO2: 22 mmol/L (ref 22–32)
Calcium: 8.8 mg/dL — ABNORMAL LOW (ref 8.9–10.3)
Chloride: 101 mmol/L (ref 98–111)
Creatinine, Ser: 1.26 mg/dL — ABNORMAL HIGH (ref 0.61–1.24)
GFR, Estimated: 56 mL/min — ABNORMAL LOW (ref >60)
Glucose, Bld: 152 mg/dL — ABNORMAL HIGH (ref 70–99)
Potassium: 3.8 mmol/L (ref 3.5–5.1)
Sodium: 134 mmol/L — ABNORMAL LOW (ref 135–145)
Total Bilirubin: 1.2 mg/dL (ref 0.3–1.2)
Total Protein: 7.8 g/dL (ref 6.5–8.1)

## 2021-06-04 LAB — RESP PANEL BY RT-PCR (FLU A&B, COVID) ARPGX2
Influenza A by PCR: NEGATIVE
Influenza B by PCR: NEGATIVE
SARS Coronavirus 2 by RT PCR: POSITIVE — AB

## 2021-06-04 LAB — LIPID PANEL
Cholesterol: 91 mg/dL (ref 0–200)
HDL: 36 mg/dL — ABNORMAL LOW (ref >40)
LDL Cholesterol: 42 mg/dL (ref 0–99)
Total CHOL/HDL Ratio: 2.5 RATIO
Triglycerides: 67 mg/dL (ref <150)
VLDL: 13 mg/dL (ref 0–40)

## 2021-06-04 LAB — PROTIME-INR
INR: 2.1 — ABNORMAL HIGH (ref 0.8–1.2)
Prothrombin Time: 23.4 seconds — ABNORMAL HIGH (ref 11.4–15.2)

## 2021-06-04 LAB — TROPONIN I (HIGH SENSITIVITY)
Troponin I (High Sensitivity): 14 ng/L (ref <18)
Troponin I (High Sensitivity): 22 ng/L — ABNORMAL HIGH (ref <18)

## 2021-06-04 LAB — BRAIN NATRIURETIC PEPTIDE: B Natriuretic Peptide: 84.7 pg/mL (ref 0.0–100.0)

## 2021-06-04 LAB — MAGNESIUM: Magnesium: 1.9 mg/dL (ref 1.7–2.4)

## 2021-06-04 LAB — TSH: TSH: 0.785 u[IU]/mL (ref 0.350–4.500)

## 2021-06-04 MED ORDER — WARFARIN SODIUM 2.5 MG PO TABS
2.5000 mg | ORAL_TABLET | ORAL | Status: DC
  Administered 2021-06-04: 2.5 mg via ORAL
  Filled 2021-06-04 (×2): qty 1

## 2021-06-04 MED ORDER — TRAMADOL HCL 50 MG PO TABS
50.0000 mg | ORAL_TABLET | Freq: Every evening | ORAL | Status: DC | PRN
  Administered 2021-06-05: 50 mg via ORAL
  Filled 2021-06-04: qty 1

## 2021-06-04 MED ORDER — AMIODARONE HCL IN DEXTROSE 360-4.14 MG/200ML-% IV SOLN
60.0000 mg/h | INTRAVENOUS | Status: DC
  Administered 2021-06-04: 60 mg/h via INTRAVENOUS
  Filled 2021-06-04: qty 200

## 2021-06-04 MED ORDER — SPIRONOLACTONE 25 MG PO TABS
25.0000 mg | ORAL_TABLET | Freq: Every day | ORAL | Status: DC
  Administered 2021-06-05: 25 mg via ORAL
  Filled 2021-06-04: qty 1

## 2021-06-04 MED ORDER — AMIODARONE IV BOLUS ONLY 150 MG/100ML
150.0000 mg | Freq: Once | INTRAVENOUS | Status: AC
  Administered 2021-06-04: 150 mg via INTRAVENOUS
  Filled 2021-06-04: qty 100

## 2021-06-04 MED ORDER — WARFARIN - PHYSICIAN DOSING INPATIENT
Freq: Every day | Status: DC
  Filled 2021-06-04: qty 1

## 2021-06-04 MED ORDER — GUAIFENESIN-DM 100-10 MG/5ML PO SYRP: 10.0000 mL | ORAL_SOLUTION | ORAL | Status: DC | PRN

## 2021-06-04 MED ORDER — SODIUM CHLORIDE 0.9 % IV SOLN
200.0000 mg | Freq: Once | INTRAVENOUS | Status: AC
  Administered 2021-06-04: 200 mg via INTRAVENOUS
  Filled 2021-06-04: qty 200

## 2021-06-04 MED ORDER — ATORVASTATIN CALCIUM 20 MG PO TABS
40.0000 mg | ORAL_TABLET | Freq: Every evening | ORAL | Status: DC
  Administered 2021-06-04: 40 mg via ORAL
  Filled 2021-06-04: qty 2

## 2021-06-04 MED ORDER — ONDANSETRON HCL 4 MG PO TABS: 4.0000 mg | ORAL_TABLET | Freq: Four times a day (QID) | ORAL | Status: DC | PRN

## 2021-06-04 MED ORDER — EMPAGLIFLOZIN 10 MG PO TABS
10.0000 mg | ORAL_TABLET | Freq: Every day | ORAL | Status: DC
  Administered 2021-06-05: 10 mg via ORAL
  Filled 2021-06-04: qty 1

## 2021-06-04 MED ORDER — ALPRAZOLAM 0.5 MG PO TABS
0.5000 mg | ORAL_TABLET | Freq: Two times a day (BID) | ORAL | Status: DC
  Administered 2021-06-04 – 2021-06-05 (×2): 0.5 mg via ORAL
  Filled 2021-06-04 (×2): qty 1

## 2021-06-04 MED ORDER — IPRATROPIUM BROMIDE 0.03 % NA SOLN
2.0000 | Freq: Three times a day (TID) | NASAL | Status: DC | PRN
  Filled 2021-06-04: qty 30

## 2021-06-04 MED ORDER — SODIUM CHLORIDE 0.9 % IV SOLN
100.0000 mg | Freq: Every day | INTRAVENOUS | Status: DC
  Administered 2021-06-05: 100 mg via INTRAVENOUS
  Filled 2021-06-04 (×2): qty 20

## 2021-06-04 MED ORDER — ONDANSETRON HCL 4 MG/2ML IJ SOLN: 4.0000 mg | Freq: Four times a day (QID) | INTRAMUSCULAR | Status: DC | PRN

## 2021-06-04 MED ORDER — AMIODARONE HCL IN DEXTROSE 360-4.14 MG/200ML-% IV SOLN
30.0000 mg/h | INTRAVENOUS | Status: DC
  Administered 2021-06-05: 30 mg/h via INTRAVENOUS
  Filled 2021-06-04: qty 200

## 2021-06-04 MED ORDER — ENOXAPARIN SODIUM 40 MG/0.4ML IJ SOSY: 0.5000 mg/kg | PREFILLED_SYRINGE | INTRAMUSCULAR | Status: DC

## 2021-06-04 MED ORDER — GABAPENTIN 600 MG PO TABS
600.0000 mg | ORAL_TABLET | Freq: Two times a day (BID) | ORAL | Status: DC
  Administered 2021-06-04 – 2021-06-05 (×2): 600 mg via ORAL
  Filled 2021-06-04 (×2): qty 1

## 2021-06-04 MED ORDER — ADULT MULTIVITAMIN W/MINERALS CH
1.0000 | ORAL_TABLET | Freq: Every day | ORAL | Status: DC
  Administered 2021-06-05: 1 via ORAL
  Filled 2021-06-04: qty 1

## 2021-06-04 MED ORDER — SENNA 8.6 MG PO TABS
1.0000 | ORAL_TABLET | Freq: Two times a day (BID) | ORAL | Status: DC
  Administered 2021-06-04: 8.6 mg via ORAL
  Filled 2021-06-04 (×2): qty 1

## 2021-06-04 MED ORDER — LEVOTHYROXINE SODIUM 50 MCG PO TABS
100.0000 ug | ORAL_TABLET | Freq: Every day | ORAL | Status: DC
  Administered 2021-06-05: 100 ug via ORAL
  Filled 2021-06-04: qty 2

## 2021-06-04 MED ORDER — ACETAMINOPHEN 500 MG PO TABS
1000.0000 mg | ORAL_TABLET | Freq: Once | ORAL | Status: AC
  Administered 2021-06-04: 1000 mg via ORAL
  Filled 2021-06-04: qty 2

## 2021-06-04 MED ORDER — POLYETHYLENE GLYCOL 3350 17 G PO PACK: 17.0000 g | PACK | Freq: Every day | ORAL | Status: DC | PRN

## 2021-06-04 MED ORDER — INSULIN GLARGINE-YFGN 100 UNIT/ML ~~LOC~~ SOLN
15.0000 [IU] | Freq: Every day | SUBCUTANEOUS | Status: DC
  Administered 2021-06-04: 15 [IU] via SUBCUTANEOUS
  Filled 2021-06-04 (×2): qty 0.15

## 2021-06-04 MED ORDER — DEXAMETHASONE 6 MG PO TABS
6.0000 mg | ORAL_TABLET | ORAL | Status: DC
  Administered 2021-06-04: 6 mg via ORAL
  Filled 2021-06-04 (×2): qty 1

## 2021-06-04 MED ORDER — PANTOPRAZOLE SODIUM 40 MG PO TBEC
40.0000 mg | DELAYED_RELEASE_TABLET | Freq: Every day | ORAL | Status: DC
  Administered 2021-06-05: 40 mg via ORAL
  Filled 2021-06-04: qty 1

## 2021-06-04 MED ORDER — WARFARIN SODIUM 2.5 MG PO TABS: 5.0000 mg | ORAL_TABLET | ORAL | Status: DC

## 2021-06-04 MED ORDER — ACETAMINOPHEN 325 MG PO TABS: 650.0000 mg | ORAL_TABLET | Freq: Four times a day (QID) | ORAL | Status: DC | PRN

## 2021-06-04 NOTE — ED Notes (Signed)
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 Pt is in NAD at present, states he took tylenol and tramadol around midnight and took his daily meds this morning that included 100mg  of amiodarone

## 2021-06-04 NOTE — ED Triage Notes (Signed)
BIB [facility] from home for fall. Pt found to have ST changes on EKG foro EMS. Sent 12[facility] to [facility] and they didn't activate STEMI and pt requested to come to [facility]. Pt advised he is weak. Denies any CP. EMS has multiple strips that show runs of AFIB and VTACH. Vitals good for EMS. 20 LAC.

## 2021-06-04 NOTE — Consult Note (Signed)
Remdesivir - Pharmacy Brief Note   O:  ALT: 21 CXR: There are no signs of pulmonary edema or focal pulmonary consolidation. Linear densities in the left lower lung fields suggest subsegmental atelectasis. SpO2: 92% on RA   A/P:  Remdesivir 200 mg IVPB once followed by 100 mg IVPB daily x 4 days.   Darnelle Bos, PharmD 06/04/2021 6:44 PM

## 2021-06-04 NOTE — Consult Note (Signed)
[facility] Cardiology  CARDIOLOGY CONSULT NOTE  Patient ID: ABAD MANARD MRN: [id number] DOB/AGE: September 03, 1933 87 y.o.  Admit date: 06/04/2021 Referring Physician Duffy Bruce Primary Physician Tracie Harrier, MD Primary Cardiologist Serafina Royals Reason for Consultation : cardiomyopathy, VT in ems  HPI:  Dakota Thomas is an 87 year old male with history of HFrEF (EF 25%) s/p ICD, A. fib on warfarin, apical mural thrombus, hypertension, moderate MR, CAD s/p CABG times 10/01/1993, history of VT on amiodarone, type 2 diabetes, hyperlipidemia who presents with flulike symptoms.  Cardiology is consulted because of several short episodes of NSVT that were noted by EMS.  Reportedly his wife have been sick with a respiratory illness and he thinks he caught it.  Over the last 24 hours he has had generalized weakness and fevers as high as 102.4.  Earlier today he was walking and began to feel lightheaded and nearly lost consciousness, falling to the ground.  He did not sustain any injuries and says he does no think he lost consciousness. His wife subsequently called EMS.  When EMS arrived he was lightheaded and pale; EMS monitoring shows multiple short runs of NSVT in which the patient was symptomatic during this timeframe.  He did not receive any shocks from his ICD.  Since arrival back in the emergency department he is in normal sinus rhythm.  He denies any chest pain or shortness of breath.  Since arrival to the ED he has been hemodynamically stable with out any recurrent VT. He denies SOB, chest pain, LE edema, orthopnea, PND.   Review of systems complete and found to be negative unless listed above     Past Medical History:  Diagnosis Date   A-fib ([place]) 2017   AICD (automatic cardioverter/defibrillator) present    Allergic state    Anxiety    Arrhythmia    atrial fib   Bacteremia    Basal cell carcinoma 08/25/2009   L mid helix    Basal cell carcinoma 08/17/2015   R distal dorsum nose     Basal cell carcinoma 11/22/2016   L infra auricular    BPH (benign prostatic hyperplasia)    CAD (coronary artery disease)    Cancer (HCC)    skin of the nose and arm   CHF (congestive heart failure) ([place])    Clotting disorder ([place]) 2009   Diabetes mellitus without complication ([place])    Diverticulitis    Family history of adverse reaction to anesthesia    daughter sleeps a long time   H/O diverticulitis of colon    Heart attack ([place]) 1985, 1995,2000, 2004,    multiple MI's   Heart disease    Heartburn    History of kidney stones    History of stomach ulcers    HLD (hyperlipidemia)    Hypertension    Hypothyroidism    Ischemic cardiomyopathy    Kidney stones    Neuropathy    Pacemaker    Post herpetic neuralgia    left side of face   Renal disorder    UTI's   Rosacea    Skin cancer 04/07/2013   L mid dorsum nose - sebaceous adenoma    Squamous cell carcinoma of skin 08/17/2015   L mid volar forearm - SCCIS    Squamous cell carcinoma of skin 08/21/2017   L lat forehead    Testosterone deficiency    Tremors of nervous system    Trigeminal neuralgia of left side of face    Ventricular tachycardia ([place])  Past Surgical History:  Procedure Laterality Date   acid     APPENDECTOMY     BACK SURGERY     cervical diskectomy   CERVICAL DISCECTOMY     CHOLECYSTECTOMY     COLONOSCOPY WITH PROPOFOL N/A 01/27/2016   Procedure: COLONOSCOPY WITH PROPOFOL;  Surgeon: Manya Silvas, MD;  Location[facility] ENDOSCOPY;  Service: Endoscopy;  Laterality: N/A;   COLONOSCOPY WITH PROPOFOL N/A 03/20/2019   Procedure: COLONOSCOPY WITH PROPOFOL;  Surgeon: Toledo, Benay Pike, MD;  Location: [facility] ENDOSCOPY;  Service: Gastroenterology;  Laterality: N/A;   COLONOSCOPY WITH PROPOFOL N/A 08/24/2020   Procedure: COLONOSCOPY WITH PROPOFOL;  Surgeon: Toledo, Benay Pike, MD;  Location: [facility] ENDOSCOPY;  Service: Gastroenterology;  Laterality: N/A;   CORONARY ANGIOPLASTY WITH STENT PLACEMENT     multiple  times   CORONARY ARTERY BYPASS GRAFT     ESOPHAGOGASTRODUODENOSCOPY (EGD) WITH PROPOFOL N/A 03/20/2019   Procedure: ESOPHAGOGASTRODUODENOSCOPY (EGD) WITH PROPOFOL;  Surgeon: Toledo, Benay Pike, MD;  Location: [facility] ENDOSCOPY;  Service: Gastroenterology;  Laterality: N/A;   eyelids     GREEN LIGHT LASER TURP (TRANSURETHRAL RESECTION OF PROSTATE N/A 03/19/2017   Procedure: GREEN LIGHT LASER TURP (TRANSURETHRAL RESECTION OF PROSTATE;  Surgeon: Royston Cowper, MD;  Location: [facility] ORS;  Service: Urology;  Laterality: N/A;   IMPLANTABLE CARDIOVERTER DEFIBRILLATOR (ICD) GENERATOR CHANGE Left 05/30/2016   Procedure: ICD GENERATOR CHANGE;  Surgeon: Isaias Cowman, MD;  Location: [facility] ORS;  Service: Cardiovascular;  Laterality: Left;   INSERT / REPLACE / REMOVE PACEMAKER     kidney stones     NASAL SEPTUM SURGERY     NASAL SEPTUM SURGERY     ostate surgery     PACEMAKER INSERTION     prostate heating     seven years ago   PROSTATE SURGERY     TEE WITHOUT CARDIOVERSION N/A 11/28/2015   Procedure: Transesophageal Echocardiogram ([place]);  Surgeon: Teodoro Spray, MD;  Location: [facility] ORS;  Service: Cardiovascular;  Laterality: N/A;   THYROID SURGERY     TONSILLECTOMY     x 2    (Not in a hospital admission)  Social History   Socioeconomic History   Marital status: Married    Spouse name: Not on file   Number of children: Not on file   Years of education: Not on file   Highest education level: Not on file  Occupational History   Not on file  Tobacco Use   Smoking status: Former    Packs/day: 1.00    Years: 30.00    Pack years: 30.00    Types: Cigarettes    Quit date: 05/22/1984    Years since quitting: 37.0   Smokeless tobacco: Never  Vaping Use   Vaping Use: Never used  Substance and Sexual Activity   Alcohol use: Yes    Alcohol/week: 0.0 - 1.0 standard drinks    Comment: 1 glass of wine per month   Drug use: No   Sexual activity: Not Currently  Other Topics Concern   Not on file   Social History Narrative   Not on file   Social Determinants of Health   Financial Resource Strain: Not on file  Food Insecurity: Not on file  Transportation Needs: Not on file  Physical Activity: Not on file  Stress: Not on file  Social Connections: Not on file  Intimate Partner Violence: Not on file    Family History  Problem Relation Age of Onset   CAD Other    Diabetes  Other    Hypertension Other    Bladder Cancer Paternal Grandfather    Heart disease Mother    Diabetes Mother    CAD Mother    Heart attack Mother    Hypertension Mother    Hyperlipidemia Mother    Obesity Mother    Thyroid disease Mother    Heart disease Father    Diabetes Father    Emphysema Father    Heart attack Father    Hypertension Father    Hyperlipidemia Father    Alcohol abuse Father    CAD Maternal Grandfather    Sudden death Maternal Grandfather    Hyperlipidemia Maternal Grandfather    CAD Other    Hyperlipidemia Other    Alzheimer's disease Maternal Uncle    Alzheimer's disease Maternal Grandmother    Obesity Paternal Grandmother    Kidney disease Neg Hx    Prostate cancer Neg Hx    Kidney cancer Neg Hx       Review of systems complete and found to be negative unless listed above      PHYSICAL EXAM  General: Appears stated age, in no acute distress HEENT:  Normocephalic and atramatic Neck:  No JVD.  Lungs: Clear bilaterally to auscultation and percussion. Heart: Tachycardic but regular . Normal S1 and S2 without gallops or murmurs.  Abdomen: Bowel sounds are positive, abdomen soft and non-tender  Msk:  Back normal. Normal strength and tone for age. Extremities: No clubbing, cyanosis or edema.   Neuro: Alert and oriented X 3. Psych:  Good affect, responds appropriately  Labs:   Lab Results  Component Value Date   WBC 7.2 06/04/2021   HGB 15.9 06/04/2021   HCT 48.3 06/04/2021   MCV 97.6 06/04/2021   PLT 119 (L) 06/04/2021   No results for input(s): NA, K, CL,  CO2, BUN, CREATININE, CALCIUM, PROT, BILITOT, ALKPHOS, ALT, AST, GLUCOSE in the last 168 hours.  Invalid input(s): LABALBU Lab Results  Component Value Date   CKTOTAL 226 11/22/2015   TROPONINI 0.03 11/23/2015   No results found for: CHOL No results found for: HDL No results found for: LDLCALC No results found for: TRIG No results found for: CHOLHDL No results found for: LDLDIRECT    Radiology: DG Chest Portable 1 View  Result Date: 06/04/2021 CLINICAL DATA:  Shortness of breath EXAM: PORTABLE CHEST 1 VIEW COMPARISON:  05/22/2016 FINDINGS: Transverse diameter of heart is slightly increased. There are no signs of pulmonary edema. There is poor inspiration. Linear densities seen in the left lower lung fields. There is no focal pulmonary consolidation. Pacemaker/defibrillator battery is seen in the left infraclavicular region with tips of leads in the right atrium and right ventricle. Metallic sutures are seen in the sternum, possibly suggesting previous coronary bypass surgery. There is calcified granuloma in the medial left lower lung fields. IMPRESSION: There are no signs of pulmonary edema or focal pulmonary consolidation. Linear densities in the left lower lung fields suggest subsegmental atelectasis. Electronically Signed   By: Elmer Picker M.D.   On: 06/04/2021 14:21    EKG: Sinus tachycardia with anteroseptal Q waves and QRS widening.  Frequent PVCs.  Echo 03/2021- SEVERE LV SYSTOLIC DYSFUNCTION (See above)  MILD RV SYSTOLIC DYSFUNCTION (See above)  NO VALVULAR STENOSIS  TRIVIAL MR, TR, PR  EF 25%  MILD VALVULAR REGURGITATION (See above)   ASSESSMENT AND PLAN:  Dakota Thomas is an 87 year old male with history of HFrEF (EF 25%) s/p ICD, A. fib on warfarin, apical mural  thrombus, hypertension, moderate MR, CAD s/p CABG times 10/01/1993, history of VT on amiodarone, type 2 diabetes, hyperlipidemia who presents with flulike symptoms.  Cardiology is consulted because of several  short episodes of NSVT that were noted by EMS.  #Ventricular tachycardia # (pre)syncope He had several short runs of NSVT in route with EMS.  He has a known history of VT and chronically takes amiodarone for this.  He currently has flulike symptoms which are likely driving these current salvos of VT and are probably contributing to his presyncope episode. -Agree with IV amiodarone over the next 24 hours followed by transition back to p.o. -Patient with AICD in place. -Monitor on telemetry while inpatient - Would refrain from aggressive IVF fluid resuscitation, but would encourage good PO intake.  #Heart failure with reduced ejection fraction He does not recently endorse signs or symptoms of heart failure.  BNP is 87.  If blood pressure tolerates would recommend continuation of home medications. -Continue Jardiance 10 mg -Patient is no longer on metoprolol per Dr. Alveria Apley most recent note due to dizziness -Hold spironolactone 25 mg while we assess AKI and whether BP will remain normal.  -No reason to repeat echocardiogram at this time  #CAD s/p CABG in 1995 Patient is not currently having any chest pain.  High-sensitivity troponin is 14. -Continue aspirin 81 mg -Continue atorvastatin 40  # paroxysmal AF - Continue coumadin with help from pharmacy. INR is at goal on admission  Signed: Andrez Grime MD 06/04/2021, 2:38 PM

## 2021-06-04 NOTE — ED Provider Notes (Signed)
[facility] Emergency Department Provider Note  ____________________________________________   Event Date/Time   First MD Initiated Contact with Patient 06/04/21 1351     (approximate)  I have reviewed the triage vital signs and the nursing notes.   HISTORY  Chief Complaint Dizziness    HPI Dakota Thomas is a 87 y.o. male  with h/o HFrEF, pAFib, VTach s/p AICD placement, here with generalized weakness, fevers. PT reports that his sx started over the past 24 hours. He reports that over this time, he has had progressively worsening weakness, chills, subjective fever, and tremors. HIs wife is currently sick with a respiratory illness and he thinks he caught it. Earlier today, he was walking to the other room in his house when he began to feel very lightheaded. He then lost consciousness, falling to the ground. He woke up back on the ground, called for his wife who called EMS. With EMS, pt has had several episodes in which he got lightheaded, pale, sweaty, and went into recurrent runs of NSVT. No shocks delivered. He now feels somewhat better back in sinus rhythm. Denies any other recent illnesses or med changes. No other complaints. No current CP. He does report he's had a mild cough the past day or so.        Past Medical History:  Diagnosis Date   A-fib ([place]) 2017   AICD (automatic cardioverter/defibrillator) present    Allergic state    Anxiety    Arrhythmia    atrial fib   Bacteremia    Basal cell carcinoma 08/25/2009   L mid helix    Basal cell carcinoma 08/17/2015   R distal dorsum nose    Basal cell carcinoma 11/22/2016   L infra auricular    BPH (benign prostatic hyperplasia)    CAD (coronary artery disease)    Cancer (HCC)    skin of the nose and arm   CHF (congestive heart failure) ([place])    Clotting disorder ([place]) 2009   Diabetes mellitus without complication ([place])    Diverticulitis    Family history of adverse reaction to anesthesia     daughter sleeps a long time   H/O diverticulitis of colon    Heart attack ([place]) 1985, 1995,2000, 2004,    multiple MI's   Heart disease    Heartburn    History of kidney stones    History of stomach ulcers    HLD (hyperlipidemia)    Hypertension    Hypothyroidism    Ischemic cardiomyopathy    Kidney stones    Neuropathy    Pacemaker    Post herpetic neuralgia    left side of face   Renal disorder    UTI's   Rosacea    Skin cancer 04/07/2013   L mid dorsum nose - sebaceous adenoma    Squamous cell carcinoma of skin 08/17/2015   L mid volar forearm - SCCIS    Squamous cell carcinoma of skin 08/21/2017   L lat forehead    Testosterone deficiency    Tremors of nervous system    Trigeminal neuralgia of left side of face    Ventricular tachycardia [facility])     Patient Active Problem List   Diagnosis Date Noted   Non-sustained ventricular tachycardia 56/25/6389   Chronic systolic CHF (congestive heart failure) ([place]) 06/04/2021   LV (left ventricular) mural thrombus 06/04/2021   Acute respiratory disease due to COVID-19 virus 06/04/2021   BPH with obstruction/lower urinary tract symptoms 12/25/2015  Bacteremia 11/24/2015   Near syncope 11/22/2015   Diabetes ([place]) 11/22/2015   CAD (coronary artery disease) 11/22/2015   HTN (hypertension) 11/22/2015   A-fib ([place]) 11/22/2015   BPH (benign prostatic hyperplasia) 11/22/2015    Past Surgical History:  Procedure Laterality Date   acid     APPENDECTOMY     BACK SURGERY     cervical diskectomy   CERVICAL DISCECTOMY     CHOLECYSTECTOMY     COLONOSCOPY WITH PROPOFOL N/A 01/27/2016   Procedure: COLONOSCOPY WITH PROPOFOL;  Surgeon: Manya Silvas, MD;  Location: [place];  Service: Endoscopy;  Laterality: N/A;   COLONOSCOPY WITH PROPOFOL N/A 03/20/2019   Procedure: COLONOSCOPY WITH PROPOFOL;  Surgeon: [place], Benay Pike, MD;  Location: [facility];  Service: Gastroenterology;  Laterality: N/A;   COLONOSCOPY WITH  PROPOFOL N/A 08/24/2020   Procedure: COLONOSCOPY WITH PROPOFOL;  Surgeon: [place], Benay Pike, MD;  Location: [facility];  Service: Gastroenterology;  Laterality: N/A;   CORONARY ANGIOPLASTY WITH STENT PLACEMENT     multiple times   CORONARY ARTERY BYPASS GRAFT     ESOPHAGOGASTRODUODENOSCOPY (EGD) WITH PROPOFOL N/A 03/20/2019   Procedure: ESOPHAGOGASTRODUODENOSCOPY (EGD) WITH PROPOFOL;  Surgeon: Tol[place], Benay Pike, MD;  Location: [facility] END[facility];  Service: Gastroenterology;  Laterality: N/A;   eyelids     GREEN LIGHT LASER TURP (TRANSURETHRAL RESECTION OF PROSTATE N/A 03/19/2017   Procedure: GREEN LIGHT LASER TURP (TRANSURETHRAL RESECTION OF PROSTATE;  Surgeon: Royston Cowper, MD;  Location: [facility];  Service: Urology;  Laterality: N/A;   IMPLANTABLE CARDIOVERTER DEFIBRILLATOR (ICD) GENERATOR CHANGE Left 05/30/2016   Procedure: ICD GENERATOR CHANGE;  Surgeon: Isaias Cowman, MD;  Location: [facility];  Service: Cardiovascular;  Laterality: Left;   INSERT / REPLACE / REMOVE PACEMAKER     kidney stones     NASAL SEPTUM SURGERY     NASAL SEPTUM SURGERY     ostate surgery     PACEMAKER INSERTION     prostate heating     seven years ago   PROSTATE SURGERY     TEE WITHOUT CARDIOVERSION N/A 11/28/2015   Procedure: Transesophageal Echocardiogram ([place]);  Surgeon: Teodoro Spray, MD;  Location: [facility];  Service: Cardiovascular;  Laterality: N/A;   THYROID SURGERY     TONSILLECTOMY     x 2    Prior to Admission medications   Medication Sig Start Date End Date Taking? Authorizing Provider  ALPRAZolam Duanne Moron) 0.5 MG tablet Take 0.5 mg by mouth 2 (two) times daily.   Yes [provider]  amiodarone (PACERONE) 200 MG tablet Take 100 mg by mouth daily.   Yes [provider]  atorvastatin (LIPITOR) 40 MG tablet Take 40 mg by mouth every evening.    Yes [provider]  empagliflozin (JARDIANCE) 10 MG TABS tablet Take by mouth daily.   Yes [provider]   gabapentin (NEURONTIN) 600 MG tablet Take 600 mg by mouth 2 (two) times daily.    Yes [provider]  insulin aspart (NOVOLOG) 100 UNIT/ML FlexPen Inject 0-6 Units into the skin 2 (two) times daily before a meal. Dose per sliding scale   Yes [provider]  insulin glargine (LANTUS) 100 unit/mL SOPN Inject 25 Units into the skin at bedtime.    Yes [provider]  levothyroxine (SYNTHROID, LEVOTHROID) 100 MCG tablet Take 100 mcg by mouth daily before breakfast.   Yes [provider]  Multiple Vitamin (MULTIVITAMIN WITH MINERALS) TABS tablet Take 1 tablet by mouth daily.  Yes [provider]  omeprazole (PRILOSEC) 20 MG capsule Take 20 mg by mouth See admin instructions. Takes 20mg  every morning. Takes an additional 20mg  in the evening as needed for acid reflux   Yes [provider]  spironolactone (ALDACTONE) 25 MG tablet Take 25 mg by mouth daily. 10/10/15  Yes [provider]  testosterone cypionate (DEPOTESTOSTERONE CYPIONATE) 200 MG/ML injection INJECT 200mg (1 ML) IN THE MUSCLE EVERY 28 DAYS 01/10/17  Yes [provider]  traMADol (ULTRAM) 50 MG tablet Take by mouth as needed.   Yes [provider]  warfarin (COUMADIN) 2.5 MG tablet Take 2.5 mg by mouth daily. Pt taking 2.5 daily except tuesdays; taking 5 mg tuesdays   Yes [provider]  metoprolol succinate (TOPROL-XL) 25 MG 24 hr tablet Take 25 mg by mouth daily.  Patient not taking: Reported on 03/22/2021 10/10/15   [provider]  potassium gluconate 595 (99 K) MG TABS tablet Take 1 tablet by mouth every other day.    [provider]  warfarin (COUMADIN) 2 MG tablet Take 2 mg by mouth as directed. Patient not taking: Reported on 06/04/2021    [provider]    Allergies Levaquin [levofloxacin], Ciprofloxacin, Lidocaine, Metformin, Tannic acid, and Tamsulosin hcl  Family History  Problem Relation Age of Onset   CAD  Other    Diabetes Other    Hypertension Other    Bladder Cancer Paternal Grandfather    Heart disease Mother    Diabetes Mother    CAD Mother    Heart attack Mother    Hypertension Mother    Hyperlipidemia Mother    Obesity Mother    Thyroid disease Mother    Heart disease Father    Diabetes Father    Emphysema Father    Heart attack Father    Hypertension Father    Hyperlipidemia Father    Alcohol abuse Father    CAD Maternal Grandfather    Sudden death Maternal Grandfather    Hyperlipidemia Maternal Grandfather    CAD Other    Hyperlipidemia Other    Alzheimer's disease Maternal Uncle    Alzheimer's disease Maternal Grandmother    Obesity Paternal Grandmother    Kidney disease Neg Hx    Prostate cancer Neg Hx    Kidney cancer Neg Hx     Social History Social History   Tobacco Use   Smoking status: Former    Packs/day: 1.00    Years: 30.00    Pack years: 30.00    Types: Cigarettes    Quit date: 05/22/1984    Years since quitting: 37.0   Smokeless tobacco: Never  Vaping Use   Vaping Use: Never used  Substance Use Topics   Alcohol use: Yes    Alcohol/week: 0.0 - 1.0 standard drinks    Comment: 1 glass of wine per month   Drug use: No    Review of Systems  Review of Systems  Constitutional:  Positive for fatigue. Negative for chills and fever.  HENT:  Negative for sore throat.   Respiratory:  Negative for shortness of breath.   Cardiovascular:  Negative for chest pain.  Gastrointestinal:  Negative for abdominal pain.  Genitourinary:  Negative for flank pain.  Musculoskeletal:  Negative for neck pain.  Skin:  Negative for rash and wound.  Allergic/Immunologic: Negative for immunocompromised state.  Neurological:  Positive for syncope and light-headedness. Negative for weakness and numbness.  Hematological:  Does not bruise/bleed easily.  All other systems  reviewed and are negative.   ____________________________________________  PHYSICAL EXAM:       VITAL SIGNS: ED Triage Vitals  Enc Vitals Group     BP --      Pulse Rate 06/04/21 1346 (!) 105     Resp 06/04/21 1346 16     Temp 06/04/21 1346 98.4 F (36.9 C)     Temp Source 06/04/21 1346 Oral     SpO2 06/04/21 1346 96 %     Weight 06/04/21 1349 185 lb (83.9 kg)     Height 06/04/21 1349 6\' 1"  (1.854 m)     Head Circumference --      Peak Flow --      Pain Score 06/04/21 1349 0     Pain Loc --      Pain Edu? --      Excl. in [place]? --      Physical Exam Vitals and nursing note reviewed.  Constitutional:      General: He is not in acute distress.    Appearance: He is well-developed.  HENT:     Head: Normocephalic and atraumatic.     Nose: Congestion and rhinorrhea present.  Eyes:     Conjunctiva/sclera: Conjunctivae normal.  Cardiovascular:     Rate and Rhythm: Regular rhythm. Tachycardia present.     Heart sounds: Normal heart sounds.  Pulmonary:     Effort: Pulmonary effort is normal. No respiratory distress.     Breath sounds: Rhonchi (bibasilar) present. No wheezing.  Abdominal:     General: There is no distension.  Musculoskeletal:     Cervical back: Neck supple.     Right lower leg: No edema.     Left lower leg: No edema.  Skin:    General: Skin is warm.     Capillary Refill: Capillary refill takes less than 2 seconds.     Findings: No rash.  Neurological:     Mental Status: He is alert and oriented to person, place, and time.     Motor: No abnormal muscle tone.      ____________________________________________   LABS (all labs ordered are listed, but only abnormal results are displayed)  Labs Reviewed  RESP PANEL BY RT-PCR (FLU A&B, COVID) ARPGX2 - Abnormal; Notable for the following components:      Result Value   SARS Coronavirus 2 by RT PCR POSITIVE (*)    All other components within normal limits  CBC WITH DIFFERENTIAL/PLATELET - Abnormal; Notable for the following components:   Platelets 119 (*)    All other components within normal  limits  COMPREHENSIVE METABOLIC PANEL - Abnormal; Notable for the following components:   Sodium 134 (*)    Glucose, Bld 152 (*)    Creatinine, Ser 1.26 (*)    Calcium 8.8 (*)    GFR, Estimated 56 (*)    All other components within normal limits  PROTIME-INR - Abnormal; Notable for the following components:   Prothrombin Time 23.4 (*)    INR 2.1 (*)    All other components within normal limits  URINALYSIS, ROUTINE W REFLEX MICROSCOPIC - Abnormal; Notable for the following components:   Color, Urine YELLOW (*)    APPearance HAZY (*)    Glucose, UA >=500 (*)    Ketones, ur 20 (*)    All other components within normal limits  TROPONIN I (HIGH SENSITIVITY) - Abnormal; Notable for the following components:   Troponin I (High Sensitivity) 22 (*)    All other components  within normal limits  CULTURE, BLOOD (ROUTINE X 2)  CULTURE, BLOOD (ROUTINE X 2)  MAGNESIUM  BRAIN NATRIURETIC PEPTIDE  TSH  CBC  BASIC METABOLIC PANEL  PROTIME-INR  TROPONIN I (HIGH SENSITIVITY)    ____________________________________________  EKG: Sinus tachycardia, VR 108. PR 206, QRS 103, QTc 437. No acute ST elevations or depressions. No ischemia or infarct. PVCs noted. ________________________________________  RADIOLOGY All imaging, including plain films, CT scans, and ultrasounds, independently reviewed by me, and interpretations confirmed via formal radiology reads.  ED MD interpretation:   CXR: No signs of PE or focal pulmonary consolidation  Official radiology report(s): DG Chest Portable 1 View  Result Date: 06/04/2021 CLINICAL DATA:  Shortness of breath EXAM: PORTABLE CHEST 1 VIEW COMPARISON:  05/22/2016 FINDINGS: Transverse diameter of heart is slightly increased. There are no signs of pulmonary edema. There is poor inspiration. Linear densities seen in the left lower lung fields. There is no focal pulmonary consolidation. Pacemaker/defibrillator battery is seen in the left infraclavicular region  with tips of leads in the right atrium and right ventricle. Metallic sutures are seen in the sternum, possibly suggesting previous coronary bypass surgery. There is calcified granuloma in the medial left lower lung fields. IMPRESSION: There are no signs of pulmonary edema or focal pulmonary consolidation. Linear densities in the left lower lung fields suggest subsegmental atelectasis. Electronically Signed   By: Elmer Picker M.D.   On: 06/04/2021 14:21    ____________________________________________  PROCEDURES   Procedure(s) performed (including Critical Care):  .Critical Care Performed by: Duffy Bruce, MD Authorized by: Duffy Bruce, MD   Critical care provider statement:    Critical care time (minutes):  30   Critical care time was exclusive of:  Separately billable procedures and treating other patients   Critical care was necessary to treat or prevent imminent or life-threatening deterioration of the following conditions:  Cardiac failure, circulatory failure and respiratory failure   Critical care was time spent personally by me on the following activities:  Development of treatment plan with patient or surrogate, discussions with consultants, evaluation of patient's response to treatment, examination of patient, ordering and review of laboratory studies, ordering and review of radiographic studies, ordering and performing treatments and interventions, pulse oximetry, re-evaluation of patient's condition and review of old charts   I assumed direction of critical care for this patient from another provider in my specialty: no     Care discussed with: admitting provider    ____________________________________________  [place] / MDM / [place] / ED COURSE  As part of my medical decision making, I reviewed the following data within the [place] notes reviewed and incorporated, Old chart reviewed, Notes from prior ED visits, and  [place] Controlled Substance Database       *RAMELLO CORDIAL was evaluated in Emergency Department on 06/04/2021 for the symptoms described in the history of present illness. He was evaluated in the context of the global COVID-19 pandemic, which necessitated consideration that the patient might be at risk for infection with the SARS-CoV-2 virus that causes COVID-19. Institutional protocols and algorithms that pertain to the evaluation of patients at risk for COVID-19 are in a state of rapid change based on information released by regulatory bodies including the CDC and federal and state organizations. These policies and algorithms were followed during the patient's care in the ED.  Some ED evaluations and interventions may be delayed as a result of limited staffing during the pandemic.*  Medical Decision Making:  87 yo M with PMHx as above here with generalized weakness, syncope. On EMS telemetry, pt had recurrent runs of NSVT with 6-8 beats, return to NSR. He is now in sinus tachycardia. Suspect this is in setting of viral illness, query COVID vs influenza. Discussed case with Dr. Corky Sox, will give IV amiodarone bolus. PVCs noted, but no recurrence of VT in ED. Labs reviewed and overall are unremarkable. CBC unremarkable. CMP with mild CKD. UA c/w mild dehydration. TSH normal. CXR clear without focal PNA.   Will admit to hospitalists for syncope 2/2 likely NSVT in setting of viral URI. COVID pending.   ____________________________________________  FINAL CLINICAL IMPRESSION(S) / ED DIAGNOSES  Final diagnoses:  V-tach  Syncope, unspecified syncope type  Type 2 diabetes mellitus without complication, with long-term current use of insulin (HCC)  Non-sustained ventricular tachycardia     MEDICATIONS GIVEN DURING THIS VISIT:  Medications  enoxaparin (LOVENOX) injection 42.5 mg (has no administration in time range)  acetaminophen (TYLENOL) tablet 650 mg (has no administration in time range)   polyethylene glycol (MIRALAX / GLYCOLAX) packet 17 g (has no administration in time range)  senna (SENOKOT) tablet 8.6 mg (has no administration in time range)  ondansetron (ZOFRAN) tablet 4 mg (has no administration in time range)    Or  ondansetron (ZOFRAN) injection 4 mg (has no administration in time range)  atorvastatin (LIPITOR) tablet 40 mg (has no administration in time range)  empagliflozin (JARDIANCE) tablet 10 mg (has no administration in time range)  gabapentin (NEURONTIN) tablet 600 mg (has no administration in time range)  insulin glargine-yfgn (SEMGLEE) injection 15 Units (has no administration in time range)  levothyroxine (SYNTHROID) tablet 100 mcg (has no administration in time range)  multivitamin with minerals tablet 1 tablet (has no administration in time range)  pantoprazole (PROTONIX) EC tablet 40 mg (has no administration in time range)  spironolactone (ALDACTONE) tablet 25 mg (has no administration in time range)  warfarin (COUMADIN) tablet 2.5 mg (2.5 mg Oral Given 06/04/21 1730)  warfarin (COUMADIN) tablet 5 mg (has no administration in time range)  ipratropium (ATROVENT) 0.03 % nasal spray 2 spray (has no administration in time range)  traMADol (ULTRAM) tablet 50 mg (has no administration in time range)  ALPRAZolam (XANAX) tablet 0.5 mg (has no administration in time range)  Warfarin - Physician Dosing Inpatient (has no administration in time range)  remdesivir 200 mg in sodium chloride 0.9% 250 mL IVPB (has no administration in time range)    Followed by  remdesivir 100 mg in sodium chloride 0.9 % 100 mL IVPB (has no administration in time range)  dexamethasone (DECADRON) tablet 6 mg (has no administration in time range)  guaiFENesin-dextromethorphan (ROBITUSSIN DM) 100-10 MG/5ML syrup 10 mL (has no administration in time range)  amiodarone (NEXTERONE PREMIX) 360-4.14 MG/200ML-% (1.8 mg/mL) IV infusion (has no administration in time range)  amiodarone  (NEXTERONE PREMIX) 360-4.14 MG/200ML-% (1.8 mg/mL) IV infusion (has no administration in time range)  amiodarone (NEXTERONE) IV bolus only 150 mg/100 mL (0 mg Intravenous Stopped 06/04/21 1457)  acetaminophen (TYLENOL) tablet 1,000 mg (1,000 mg Oral Given 06/04/21 1447)     ED Discharge Orders     None        Note:  This document was prepared using Dragon voice recognition software and may include unintentional dictation errors.   Duffy Bruce, MD 06/04/21 [phone]

## 2021-06-04 NOTE — H&P (Addendum)
History and Physical    Dakota Thomas YQI:[id number] DOB: Mar 29, 1934 DOA: 06/04/2021  PCP: Tracie Harrier, MD  Patient coming from: home  Chief Complaint: weakness, fall  HPI: Dakota Thomas is a 87 y.o. male with medical history significant of HFrEF (EF 25%) s/p AICD, Hx of apical mural LV thrombus on warfarin, Afib, Hx of VT, IDDM who presents with weakness and fall.  He reports that he was in his USOH until the night prior to admission where he awoke in the middle of the night feeling uncomfortably and unable to return to sleep. He reports his wife has been having cold symptoms recently and he then noted he developed sore throat, congestion, dyspnea, and associated fever up to 104.5 per home thermometer. He continued to feel poorly throughout day of admission with lightheadedness until he eventually fell due to weakness and was brought in by EMS. Telemetry en route suggested Vtach and he was brought to [facility] for further evaluation.  He denies any recent travel out of [place]. He has 3 birds - parakeet and 2 finches.  He denies any sensation of palpitations, chest pain, LE edema, orthopnea, PND. He denies nausea, vomiting, diarrhea, abd pain, dysuria, or productive cough. At baseline, he plays golf frequently and ambulates without assistance. The patient reports he has not had any shocks from his device in 14 years.  ED Course:  In the ED, Cardiology was consulted and recommended IV amiodarone and admission with telemetry monitoring. He was given 150mg  bolus and admitted to Medicine for further evaluation. Subsequently, he was found to be positive for COVID-19.  Review of Systems: As per HPI otherwise all other systems reviewed and are negative.   Past Medical History:  Diagnosis Date   A-fib ([employer]) 2017   AICD (automatic cardioverter/defibrillator) present    Allergic state    Anxiety    Arrhythmia    atrial fib   Bacteremia    Basal cell carcinoma 08/25/2009   L mid helix     Basal cell carcinoma 08/17/2015   R distal dorsum nose    Basal cell carcinoma 11/22/2016   L infra auricular    BPH (benign prostatic hyperplasia)    CAD (coronary artery disease)    Cancer (HCC)    skin of the nose and arm   CHF (congestive heart failure) ([place])    Clotting disorder ([place]) 2009   Diabetes mellitus without complication ([place])    Diverticulitis    Family history of adverse reaction to anesthesia    daughter sleeps a long time   H/O diverticulitis of colon    Heart attack ([place]) 1985, 1995,2000, 2004,    multiple MI's   Heart disease    Heartburn    History of kidney stones    History of stomach ulcers    HLD (hyperlipidemia)    Hypertension    Hypothyroidism    Ischemic cardiomyopathy    Kidney stones    Neuropathy    Pacemaker    Post herpetic neuralgia    left side of face   Renal disorder    UTI's   Rosacea    Skin cancer 04/07/2013   L mid dorsum nose - sebaceous adenoma    Squamous cell carcinoma of skin 08/17/2015   L mid volar forearm - SCCIS    Squamous cell carcinoma of skin 08/21/2017   L lat forehead    Testosterone deficiency    Tremors of nervous system    Trigeminal neuralgia of left  side of face    Ventricular tachycardia ([place])     Past Surgical History:  Procedure Laterality Date   acid     APPENDECTOMY     BACK SURGERY     cervical diskectomy   CERVICAL DISCECTOMY     CHOLECYSTECTOMY     COLONOSCOPY WITH PROPOFOL N/A 01/27/2016   Procedure: COLONOSCOPY WITH PROPOFOL;  Surgeon: Manya Silvas, MD;  Location: [facility];  Service: Endoscopy;  Laterality: N/A;   COLONOSCOPY WITH PROPOFOL N/A 03/20/2019   Procedure: COLONOSCOPY WITH PROPOFOL;  Surgeon: Toledo, Benay Pike, MD;  Location: [facility];  Service: Gastroenterology;  Laterality: N/A;   COLONOSCOPY WITH PROPOFOL N/A 08/24/2020   Procedure: COLONOSCOPY WITH PROPOFOL;  Surgeon: Toledo, Benay Pike, MD;  Location: [facility];  Service: Gastroenterology;  Laterality: N/A;    CORONARY ANGIOPLASTY WITH STENT PLACEMENT     multiple times   CORONARY ARTERY BYPASS GRAFT     ESOPHAGOGASTRODUODENOSCOPY (EGD) WITH PROPOFOL N/A 03/20/2019   Procedure: ESOPHAGOGASTRODUODENOSCOPY (EGD) WITH PROPOFOL;  Surgeon: Toledo, Benay Pike, MD;  Location: [facility];  Service: Gastroenterology;  Laterality: N/A;   eyelids     GREEN LIGHT LASER TURP (TRANSURETHRAL RESECTION OF PROSTATE N/A 03/19/2017   Procedure: GREEN LIGHT LASER TURP (TRANSURETHRAL RESECTION OF PROSTATE;  Surgeon: Royston Cowper, MD;  Location: [facility];  Service: Urology;  Laterality: N/A;   IMPLANTABLE CARDIOVERTER DEFIBRILLATOR (ICD) GENERATOR CHANGE Left 05/30/2016   Procedure: ICD GENERATOR CHANGE;  Surgeon: Isaias Cowman, MD;  Location: [facility];  Service: Cardiovascular;  Laterality: Left;   INSERT / REPLACE / REMOVE PACEMAKER     kidney stones     NASAL SEPTUM SURGERY     NASAL SEPTUM SURGERY     ostate surgery     PACEMAKER INSERTION     prostate heating     seven years ago   PROSTATE SURGERY     TEE WITHOUT CARDIOVERSION N/A 11/28/2015   Procedure: Transesophageal Echocardiogram ([place]);  Surgeon: Teodoro Spray, MD;  Location: [facility];  Service: Cardiovascular;  Laterality: N/A;   THYROID SURGERY     TONSILLECTOMY     x 2    Social History  reports that he quit smoking about 37 years ago. His smoking use included cigarettes. He has a 30.00 pack-year smoking history. He has never used smokeless tobacco. He reports current alcohol use. He reports that he does not use drugs.  Allergies  Allergen Reactions   Levaquin [Levofloxacin] Swelling    Swelling of throat    Ciprofloxacin Other (See Comments)    Makes him feel horrible, flu-like symptoms   Lidocaine Other (See Comments)    IV infusion "caused problems"   Metformin Other (See Comments)    Esophageal Spasms   Tannic Acid Hives   Tamsulosin Hcl Other (See Comments)    Cough, stomach pains, low blood pressure    Family History   Problem Relation Age of Onset   CAD Other    Diabetes Other    Hypertension Other    Bladder Cancer Paternal Grandfather    Heart disease Mother    Diabetes Mother    CAD Mother    Heart attack Mother    Hypertension Mother    Hyperlipidemia Mother    Obesity Mother    Thyroid disease Mother    Heart disease Father    Diabetes Father    Emphysema Father    Heart attack Father    Hypertension Father    Hyperlipidemia Father  Alcohol abuse Father    CAD Maternal Grandfather    Sudden death Maternal Grandfather    Hyperlipidemia Maternal Grandfather    CAD Other    Hyperlipidemia Other    Alzheimer's disease Maternal Uncle    Alzheimer's disease Maternal Grandmother    Obesity Paternal Grandmother    Kidney disease Neg Hx    Prostate cancer Neg Hx    Kidney cancer Neg Hx      Prior to Admission medications   Medication Sig Start Date End Date Taking? Authorizing Provider  ALPRAZolam Duanne Moron) 0.5 MG tablet Take 0.5 mg by mouth 2 (two) times daily.   Yes [provider]  amiodarone (PACERONE) 200 MG tablet Take 100 mg by mouth daily.   Yes [provider]  atorvastatin (LIPITOR) 40 MG tablet Take 40 mg by mouth every evening.    Yes [provider]  empagliflozin (JARDIANCE) 10 MG TABS tablet Take by mouth daily.   Yes [provider]  gabapentin (NEURONTIN) 600 MG tablet Take 600 mg by mouth 2 (two) times daily.    Yes [provider]  insulin aspart (NOVOLOG) 100 UNIT/ML FlexPen Inject 0-6 Units into the skin 2 (two) times daily before a meal. Dose per sliding scale   Yes [provider]  insulin glargine (LANTUS) 100 unit/mL SOPN Inject 25 Units into the skin at bedtime.    Yes [provider]  levothyroxine (SYNTHROID, LEVOTHROID) 100 MCG tablet Take 100 mcg by mouth daily before breakfast.   Yes [provider]  Multiple Vitamin (MULTIVITAMIN WITH MINERALS) TABS tablet Take 1 tablet by mouth daily.    Yes [provider]  omeprazole (PRILOSEC) 20 MG capsule Take 20 mg by mouth See admin instructions. Takes 20mg  every morning. Takes an additional 20mg  in the evening as needed for acid reflux   Yes [provider]  spironolactone (ALDACTONE) 25 MG tablet Take 25 mg by mouth daily. 10/10/15  Yes [provider]  testosterone cypionate (DEPOTESTOSTERONE CYPIONATE) 200 MG/ML injection INJECT 200mg (1 ML) IN THE MUSCLE EVERY 28 DAYS 01/10/17  Yes [provider]  traMADol (ULTRAM) 50 MG tablet Take by mouth as needed.   Yes [provider]  warfarin (COUMADIN) 2.5 MG tablet Take 2.5 mg by mouth daily. Pt taking 2.5 daily except tuesdays; taking 5 mg tuesdays   Yes [provider]  metoprolol succinate (TOPROL-XL) 25 MG 24 hr tablet Take 25 mg by mouth daily.  Patient not taking: Reported on 03/22/2021 10/10/15   [provider]  potassium gluconate 595 (99 K) MG TABS tablet Take 1 tablet by mouth every other day.    [provider]  warfarin (COUMADIN) 2 MG tablet Take 2 mg by mouth as directed. Patient not taking: Reported on 06/04/2021    [provider]    Physical Exam: Vitals:   06/04/21 1600 06/04/21 1630 06/04/21 1644 06/04/21 1700  BP: 127/70 107/62  104/73  Pulse: (!) 104 (!) 102  100  Resp: (!) 23 (!) 22  (!) 23  Temp:   98.8 F (37.1 C)   TempSrc:   Oral   SpO2: 94% 91%  92%  Weight:      Height:        Constitutional: NAD, calm, elderly gentleman Vitals:   06/04/21 1600 06/04/21 1630 06/04/21 1644 06/04/21 1700  BP: 127/70 107/62  104/73  Pulse: (!) 104 (!) 102  100  Resp: (!) 23 (!) 22  (!) 23  Temp:  98.8 F (37.1 C)   TempSrc:   Oral   SpO2: 94% 91%  92%  Weight:      Height:       Eyes: no scleral icterus, lids and conjunctivae normal ENMT: Mucous membranes are dry.  Neck: normal, supple, no goiter Respiratory: no wheezing, no crackles. Normal respiratory effort. No accessory muscle  use.  Cardiovascular: tachycardic rate and rhythm, no murmurs / rubs / gallops. No extremity edema. 2+ radial/pedal pulses, warm and well perfused. No carotid bruits.  Abdomen: no tenderness, no masses palpated. No hepatosplenomegaly. Bowel sounds positive.  Musculoskeletal: no clubbing / cyanosis. No joint deformity upper and lower extremities. Normal muscle tone.  Skin: no rashes, lesions, ulcers. No induration Neurologic: Sensation intact, alert, oriented Psychiatric: Normal judgment and insight. Normal mood.   Labs on Admission: I have personally reviewed following labs and imaging studies  CBC: Recent Labs  Lab 06/04/21 1353  WBC 7.2  NEUTROABS 5.3  HGB 15.9  HCT 48.3  MCV 97.6  PLT 119*    Basic Metabolic Panel: Recent Labs  Lab 06/04/21 1353  NA 134*  K 3.8  CL 101  CO2 22  GLUCOSE 152*  BUN 14  CREATININE 1.26*  CALCIUM 8.8*  MG 1.9    GFR: Estimated Creatinine Clearance: 48.4 mL/min (A) (by C-G formula based on SCr of 1.26 mg/dL (H)).  Liver Function Tests: Recent Labs  Lab 06/04/21 1353  AST 34  ALT 21  ALKPHOS 76  BILITOT 1.2  PROT 7.8  ALBUMIN 4.3    Urine analysis:    Component Value Date/Time   COLORURINE YELLOW (A) 06/04/2021 1451   APPEARANCEUR HAZY (A) 06/04/2021 1451   APPEARANCEUR Clear 12/20/2015 1418   LABSPEC 1.026 06/04/2021 1451   PHURINE 6.0 06/04/2021 1451   GLUCOSEU >=500 (A) 06/04/2021 1451   HGBUR NEGATIVE 06/04/2021 [place] 06/04/2021 1451   BILIRUBINUR Negative 12/20/2015 1418   KETONESUR 20 (A) 06/04/2021 1451   PROTEINUR NEGATIVE 06/04/2021 1451   NITRITE NEGATIVE 06/04/2021 1451   LEUKOCYTESUR NEGATIVE 06/04/2021 1451    Radiological Exams on Admission: DG Chest Portable 1 View  Result Date: 06/04/2021 CLINICAL DATA:  Shortness of breath EXAM: PORTABLE CHEST 1 VIEW COMPARISON:  05/22/2016 FINDINGS: Transverse diameter of heart is slightly increased. There are no signs of pulmonary edema.  There is poor inspiration. Linear densities seen in the left lower lung fields. There is no focal pulmonary consolidation. Pacemaker/defibrillator battery is seen in the left infraclavicular region with tips of leads in the right atrium and right ventricle. Metallic sutures are seen in the sternum, possibly suggesting previous coronary bypass surgery. There is calcified granuloma in the medial left lower lung fields. IMPRESSION: There are no signs of pulmonary edema or focal pulmonary consolidation. Linear densities in the left lower lung fields suggest subsegmental atelectasis. Electronically Signed   By: Elmer Picker M.D.   On: 06/04/2021 14:21    EKG: Independently reviewed. Sinus tach w/o acute ischemic changes and significant artifact   Assessment/Plan Principal Problem:   Non-sustained ventricular tachycardia Active Problems:   Diabetes (HCC)   CAD (coronary artery disease)   A-fib (HCC)   BPH (benign prostatic hyperplasia)   Chronic systolic CHF (congestive heart failure) (HCC)   LV (left ventricular) mural thrombus  #COVID 19 infection w/respiratory illness Patient presents w/tachycardia, mild dyspnea, w/low normal O2 sat and was found to have NSVT. - Remdesivir, dexamethasone - supp O2 as needed - prn guaifenesin  #Fall Likely  related to acute weakness - PT/OT eval - ambulate w/assistance until patient at symptomatic baseline  #VT #Hx of VT - Per Cardiology, can continue IV amiodarone for 24h, then can resume PO amiodarone - c/w monitor on telemetry - Reportedly had dizziness with metoprolol  #Chronic Systolic heart failure Appears close to euvolemia on exam, no JVP elevation. BNP WNL. - c/w spironolactone 25mg , empagliflozin 10mg  daily - Given dizziness w/BB, unlikely to tolerate ACE/ARB/ARNI  #Hx of Afib #Hx of mural thrombus - c/w warfarin, 2.5mg  daily, 5mg  Tuesdays - INR goal 2-3  #CAD Coronary artery calcifications on imaging. No prior coronary  angiogram. - c/w statin  #IDDM Home regimen 25u qhs - insulin glargine 15u qhs; titrate prn - BG QAC/HS - May need to trial carb restricted diet if BG uncontrolled  #Hypothyroidism TSH WNL - c/w levothyroxine 140mcg  #Anxiety #Chronic multijoint pain - alprazolam 0.5mg  BID; reasonable to consider outpatient wean given patient's age - tramadol 50mg  qhs prn  DVT prophylaxis: warfarin  Code Status:   Full. Patient reports he has documentation about his wishes, but he doesn't remember what they are. No documentation on file/EMR.  Disposition Plan:   Patient is from:  home  Anticipated DC to:  home  Anticipated DC date:  11/22  Anticipated DC barriers: N/a  Consults called:  Cardiology, Social work, PT/OT   Admission status:  inpatient   Severity of Illness: The appropriate patient status for this patient is INPATIENT. Inpatient status is judged to be reasonable and necessary in order to provide the required intensity of service to ensure the patient's safety. The patient's presenting symptoms, physical exam findings, and initial radiographic and laboratory data in the context of their chronic comorbidities is felt to place them at high risk for further clinical deterioration. Furthermore, it is not anticipated that the patient will be medically stable for discharge from the hospital within 2 midnights of admission.   * I certify that at the point of admission it is my clinical judgment that the patient will require inpatient hospital care spanning beyond 2 midnights from the point of admission due to high intensity of service, high risk for further deterioration and high frequency of surveillance required.Cecille Rubin MD [facility]   06/04/2021, 6:49 PM

## 2021-06-05 DIAGNOSIS — I472 Ventricular tachycardia, unspecified: Secondary | ICD-10-CM | POA: Diagnosis not present

## 2021-06-05 DIAGNOSIS — U071 COVID-19: Secondary | ICD-10-CM | POA: Diagnosis not present

## 2021-06-05 DIAGNOSIS — I4729 Other ventricular tachycardia: Secondary | ICD-10-CM | POA: Diagnosis not present

## 2021-06-05 DIAGNOSIS — I480 Paroxysmal atrial fibrillation: Secondary | ICD-10-CM

## 2021-06-05 DIAGNOSIS — I5022 Chronic systolic (congestive) heart failure: Secondary | ICD-10-CM

## 2021-06-05 DIAGNOSIS — J069 Acute upper respiratory infection, unspecified: Secondary | ICD-10-CM | POA: Diagnosis not present

## 2021-06-05 LAB — CBC
HCT: 45.6 % (ref 39.0–52.0)
Hemoglobin: 15.2 g/dL (ref 13.0–17.0)
MCH: 32.3 pg (ref 26.0–34.0)
MCHC: 33.3 g/dL (ref 30.0–36.0)
MCV: 97 fL (ref 80.0–100.0)
Platelets: 108 10*3/uL — ABNORMAL LOW (ref 150–400)
RBC: 4.7 MIL/uL (ref 4.22–5.81)
RDW: 13.7 % (ref 11.5–15.5)
WBC: 7 10*3/uL (ref 4.0–10.5)
nRBC: 0 % (ref 0.0–0.2)

## 2021-06-05 LAB — BASIC METABOLIC PANEL
Anion gap: 9 (ref 5–15)
BUN: 16 mg/dL (ref 8–23)
CO2: 24 mmol/L (ref 22–32)
Calcium: 8.6 mg/dL — ABNORMAL LOW (ref 8.9–10.3)
Chloride: 102 mmol/L (ref 98–111)
Creatinine, Ser: 1.14 mg/dL (ref 0.61–1.24)
GFR, Estimated: >60 mL/min (ref >60)
Glucose, Bld: 183 mg/dL — ABNORMAL HIGH (ref 70–99)
Potassium: 4.2 mmol/L (ref 3.5–5.1)
Sodium: 135 mmol/L (ref 135–145)

## 2021-06-05 LAB — PROTIME-INR
INR: 1.7 — ABNORMAL HIGH (ref 0.8–1.2)
Prothrombin Time: 19.7 seconds — ABNORMAL HIGH (ref 11.4–15.2)

## 2021-06-05 MED ORDER — AMIODARONE HCL 200 MG PO TABS: 200.0000 mg | ORAL_TABLET | Freq: Every day | ORAL | Status: DC

## 2021-06-05 MED ORDER — WARFARIN - PHARMACIST DOSING INPATIENT
Freq: Every day | Status: DC
  Filled 2021-06-05: qty 1

## 2021-06-05 MED ORDER — LOPERAMIDE HCL 2 MG PO CAPS
4.0000 mg | ORAL_CAPSULE | Freq: Once | ORAL | Status: AC
  Administered 2021-06-05: 4 mg via ORAL
  Filled 2021-06-05: qty 2

## 2021-06-05 MED ORDER — WARFARIN SODIUM 5 MG PO TABS
5.0000 mg | ORAL_TABLET | Freq: Once | ORAL | Status: DC
  Filled 2021-06-05 (×3): qty 1

## 2021-06-05 MED ORDER — AMIODARONE HCL 400 MG PO TABS: ORAL_TABLET | ORAL | Status: AC

## 2021-06-05 MED ORDER — AMIODARONE HCL 200 MG PO TABS
400.0000 mg | ORAL_TABLET | Freq: Two times a day (BID) | ORAL | Status: DC
  Administered 2021-06-05: 400 mg via ORAL
  Filled 2021-06-05 (×2): qty 2

## 2021-06-05 NOTE — ED Notes (Signed)
Pt on phone with daughter at this time, wife at bedside, denies any needs at this time.

## 2021-06-05 NOTE — Care Management CC44 (Signed)
Condition Code 44 Documentation Completed  Patient Details  Name: Dakota Thomas MRN: [id number] Date of Birth: [birth year]   Condition Code 44 given:  Yes Patient signature on Condition Code 44 notice:  Yes Documentation of 2 MD's agreement:  Yes Code 44 added to claim:  Yes    Kerin Salen, RN 06/05/2021, 4:14 PM

## 2021-06-05 NOTE — Care Management Obs Status (Signed)
[place] NOTIFICATION   Patient Details  Name: BRENNON OTTERNESS MRN: [id number] Date of Birth: 1933-12-22   Medicare Observation Status Notification Given:       Kerin Salen, RN 06/05/2021, 4:14 PM

## 2021-06-05 NOTE — Consult Note (Signed)
[facility] Cardiology  CARDIOLOGY CONSULT NOTE  Patient ID: Dakota Thomas MRN: [id number] DOB/AGE: [birth year] 87 y.o.  Admit date: 06/04/2021 Referring Physician Duffy Bruce Primary Physician Tracie Harrier, MD Primary Cardiologist Serafina Royals Reason for Consultation : cardiomyopathy, VT in ems  HPI:  [name] is an 87 year old male with history of HFrEF (EF 25%) s/p ICD, A. fib on warfarin, apical mural thrombus, hypertension, moderate MR, CAD s/p CABG times 10/01/1993, history of VT on amiodarone, type 2 diabetes, hyperlipidemia who presents with flulike symptoms.  Cardiology is consulted because of several short episodes of NSVT that were noted by EMS. Discovered to [place].   Interval history - No acute events.  - Feels much better today.  - No VT on telemetry overnight.   Review of systems complete and found to be negative unless listed above     Past Medical History:  Diagnosis Date   A-fib ([place]) 2017   AICD (automatic cardioverter/defibrillator) present    Allergic state    Anxiety    Arrhythmia    atrial fib   Bacteremia    Basal cell carcinoma 08/25/2009   L mid helix    Basal cell carcinoma 08/17/2015   R distal dorsum nose    Basal cell carcinoma 11/22/2016   L infra auricular    BPH (benign prostatic hyperplasia)    CAD (coronary artery disease)    Cancer (HCC)    skin of the nose and arm   CHF (congestive heart failure) ([place])    Clotting disorder ([place]) 2009   Diabetes mellitus without complication ([place])    Diverticulitis    Family history of adverse reaction to anesthesia    daughter sleeps a long time   H/O diverticulitis of colon    Heart attack ([place]) 1985, 1995,2000, 2004,    multiple MI's   Heart disease    Heartburn    History of kidney stones    History of stomach ulcers    HLD (hyperlipidemia)    Hypertension    Hypothyroidism    Ischemic cardiomyopathy    Kidney stones    Neuropathy    Pacemaker    Post herpetic neuralgia     left side of face   Renal disorder    UTI's   Rosacea    Skin cancer 04/07/2013   L mid dorsum nose - sebaceous adenoma    Squamous cell carcinoma of skin 08/17/2015   L mid volar forearm - SCCIS    Squamous cell carcinoma of skin 08/21/2017   L lat forehead    Testosterone deficiency    Tremors of nervous system    Trigeminal neuralgia of left side of face    Ventricular tachycardia ([place])     Past Surgical History:  Procedure Laterality Date   acid     APPENDECTOMY     BACK SURGERY     cervical diskectomy   CERVICAL DISCECTOMY     CHOLECYSTECTOMY     COLONOSCOPY WITH PROPOFOL N/A 01/27/2016   Procedure: COLONOSCOPY WITH PROPOFOL;  Surgeon: Manya Silvas, MD;  Location: [facility] ENDOSCOPY;  Service: Endoscopy;  Laterality: N/A;   COLONOSCOPY WITH PROPOFOL N/A 03/20/2019   Procedure: COLONOSCOPY WITH PROPOFOL;  Surgeon: Toledo, Benay Pike, MD;  Location: [facility] ENDOSCOPY;  Service: Gastroenterology;  Laterality: N/A;   COLONOSCOPY WITH PROPOFOL N/A 08/24/2020   Procedure: COLONOSCOPY WITH PROPOFOL;  Surgeon: Toledo, Benay Pike, MD;  Location: [facility] ENDOSCOPY;  Service: Gastroenterology;  Laterality: N/A;   CORONARY ANGIOPLASTY WITH STENT  PLACEMENT     multiple times   CORONARY ARTERY BYPASS GRAFT     ESOPHAGOGASTRODUODENOSCOPY (EGD) WITH PROPOFOL N/A 03/20/2019   Procedure: ESOPHAGOGASTRODUODENOSCOPY (EGD) WITH PROPOFOL;  Surgeon: Toledo, Benay Pike, MD;  Location: [facility] ENDOSCOPY;  Service: Gastroenterology;  Laterality: N/A;   eyelids     GREEN LIGHT LASER TURP (TRANSURETHRAL RESECTION OF PROSTATE N/A 03/19/2017   Procedure: GREEN LIGHT LASER TURP (TRANSURETHRAL RESECTION OF PROSTATE;  Surgeon: Royston Cowper, MD;  Location: [facility] ORS;  Service: Urology;  Laterality: N/A;   IMPLANTABLE CARDIOVERTER DEFIBRILLATOR (ICD) GENERATOR CHANGE Left 05/30/2016   Procedure: ICD GENERATOR CHANGE;  Surgeon: Isaias Cowman, MD;  Location: [facility] ORS;  Service: Cardiovascular;  Laterality: Left;   INSERT  / REPLACE / REMOVE PACEMAKER     kidney stones     NASAL SEPTUM SURGERY     NASAL SEPTUM SURGERY     ostate surgery     PACEMAKER INSERTION     prostate heating     seven years ago   PROSTATE SURGERY     TEE WITHOUT CARDIOVERSION N/A 11/28/2015   Procedure: Transesophageal Echocardiogram ([place]);  Surgeon: Teodoro Spray, MD;  Location: [facility] ORS;  Service: Cardiovascular;  Laterality: N/A;   THYROID SURGERY     TONSILLECTOMY     x 2    (Not in a hospital admission)  Social History   Socioeconomic History   Marital status: Married    Spouse name: Not on file   Number of children: Not on file   Years of education: Not on file   Highest education level: Not on file  Occupational History   Not on file  Tobacco Use   Smoking status: Former    Packs/day: 1.00    Years: 30.00    Pack years: 30.00    Types: Cigarettes    Quit date: 05/22/1984    Years since quitting: 37.0   Smokeless tobacco: Never  Vaping Use   Vaping Use: Never used  Substance and Sexual Activity   Alcohol use: Yes    Alcohol/week: 0.0 - 1.0 standard drinks    Comment: 1 glass of wine per month   Drug use: No   Sexual activity: Not Currently  Other Topics Concern   Not on file  Social History Narrative   Not on file   Social Determinants of Health   Financial Resource Strain: Not on file  Food Insecurity: Not on file  Transportation Needs: Not on file  Physical Activity: Not on file  Stress: Not on file  Social Connections: Not on file  Intimate Partner Violence: Not on file    Family History  Problem Relation Age of Onset   CAD Other    Diabetes Other    Hypertension Other    Bladder Cancer Paternal Grandfather    Heart disease Mother    Diabetes Mother    CAD Mother    Heart attack Mother    Hypertension Mother    Hyperlipidemia Mother    Obesity Mother    Thyroid disease Mother    Heart disease Father    Diabetes Father    Emphysema Father    Heart attack Father    Hypertension  Father    Hyperlipidemia Father    Alcohol abuse Father    CAD Maternal Grandfather    Sudden death Maternal Grandfather    Hyperlipidemia Maternal Grandfather    CAD Other    Hyperlipidemia Other    Alzheimer's disease Maternal Uncle  Alzheimer's disease Maternal Grandmother    Obesity Paternal Grandmother    Kidney disease Neg Hx    Prostate cancer Neg Hx    Kidney cancer Neg Hx       Review of systems complete and found to be negative unless listed above      PHYSICAL EXAM  General: Appears stated age, in no acute distress HEENT:  Normocephalic and atramatic Neck:  No JVD.  Lungs: Clear bilaterally to auscultation and percussion. Heart: Regular rate and rhythm . Normal S1 and S2 without gallops or murmurs.  Abdomen: Bowel sounds are positive, abdomen soft and non-tender  Msk:  Back normal. Normal strength and tone for age. Extremities: No clubbing, cyanosis or edema.   Neuro: Alert and oriented X 3. Psych:  Good affect, responds appropriately  Labs:   Lab Results  Component Value Date   WBC 7.2 06/04/2021   HGB 15.9 06/04/2021   HCT 48.3 06/04/2021   MCV 97.6 06/04/2021   PLT 119 (L) 06/04/2021   No results for input(s): NA, K, CL, CO2, BUN, CREATININE, CALCIUM, PROT, BILITOT, ALKPHOS, ALT, AST, GLUCOSE in the last 168 hours.  Invalid input(s): LABALBU Lab Results  Component Value Date   CKTOTAL 226 11/22/2015   TROPONINI 0.03 11/23/2015   No results found for: CHOL No results found for: HDL No results found for: LDLCALC No results found for: TRIG No results found for: CHOLHDL No results found for: LDLDIRECT    Radiology: DG Chest Portable 1 View  Result Date: 06/04/2021 CLINICAL DATA:  Shortness of breath EXAM: PORTABLE CHEST 1 VIEW COMPARISON:  05/22/2016 FINDINGS: Transverse diameter of heart is slightly increased. There are no signs of pulmonary edema. There is poor inspiration. Linear densities seen in the left lower lung fields. There is no  focal pulmonary consolidation. Pacemaker/defibrillator battery is seen in the left infraclavicular region with tips of leads in the right atrium and right ventricle. Metallic sutures are seen in the sternum, possibly suggesting previous coronary bypass surgery. There is calcified granuloma in the medial left lower lung fields. IMPRESSION: There are no signs of pulmonary edema or focal pulmonary consolidation. Linear densities in the left lower lung fields suggest subsegmental atelectasis. Electronically Signed   By: Elmer Picker M.D.   On: 06/04/2021 14:21    EKG: Sinus tachycardia with anteroseptal Q waves and QRS widening.  Frequent PVCs.  Echo 03/2021- SEVERE LV SYSTOLIC DYSFUNCTION (See above)  MILD RV SYSTOLIC DYSFUNCTION (See above)  NO VALVULAR STENOSIS  TRIVIAL MR, TR, PR  EF 25%  MILD VALVULAR REGURGITATION (See above)   ASSESSMENT AND PLAN:  Michiel Sivley is an 87 year old male with history of HFrEF (EF 25%) s/p ICD, A. fib on warfarin, apical mural thrombus, hypertension, moderate MR, CAD s/p CABG times 10/01/1993, history of VT on amiodarone, type 2 diabetes, hyperlipidemia who presents with flulike symptoms.  Cardiology is consulted because of several short episodes of NSVT that were noted by EMS.  #Ventricular tachycardia # (pre)syncope He had several short runs of NSVT in route with EMS.  He has a known history of VT and chronically takes amiodarone for this.  He currently has flulike symptoms which are likely driving these current salvos of VT and are probably contributing to his presyncope episode. - Transition from IV amio to PO amio 400 mg BID for 7 days, then 100 mg daily (home dose) -Patient with AICD in place. -Monitor on telemetry while inpatient - Would refrain from aggressive IVF fluid resuscitation, but would  encourage good PO intake. - OK for discharge from cardiovascular perspective.  - Will need f/u with Dr. Nehemiah Massed in 1 week.   #Heart failure with reduced  ejection fraction He does not recently endorse signs or symptoms of heart failure.  BNP is 87.  If blood pressure tolerates would recommend continuation of home medications. -Continue Jardiance 10 mg -Patient is no longer on metoprolol per Dr. Alveria Apley most recent note due to dizziness -Continue spironolactone 25 mg   -No reason to repeat echocardiogram at this time  #CAD s/p CABG in 1995 Patient is not currently having any chest pain.  High-sensitivity troponin is 14. -Continue aspirin 81 mg -Continue atorvastatin 40  # paroxysmal AF - Continue coumadin with help from pharmacy. INR is at goal on admission  Signed: Andrez Grime MD 06/04/2021, 2:38 PM

## 2021-06-05 NOTE — Consult Note (Signed)
ANTICOAGULATION CONSULT NOTE - Initial Consult  Pharmacy Consult for Warfarin Indication: atrial fibrillation  Allergies  Allergen Reactions   Levaquin [Levofloxacin] Swelling    Swelling of throat    Ciprofloxacin Other (See Comments)    Makes him feel horrible, flu-like symptoms   Lidocaine Other (See Comments)    IV infusion "caused problems"   Metformin Other (See Comments)    Esophageal Spasms   Tannic Acid Hives   Tamsulosin Hcl Other (See Comments)    Cough, stomach pains, low blood pressure    Patient Measurements: Height: 6\' 1"  (185.4 cm) Weight: 83.9 kg (185 lb) IBW/kg (Calculated) : 79.9  Vital Signs: Temp: 98.9 F (37.2 C) (11/21 0651) Temp Source: Oral (11/21 0651) BP: 120/70 (11/21 1000) Pulse Rate: 65 (11/21 1000)  Labs: Recent Labs    06/04/21 1353 06/04/21 1641 06/05/21 0611  HGB 15.9  --  15.2  HCT 48.3  --  45.6  PLT 119*  --  108*  LABPROT 23.4*  --  19.7*  INR 2.1*  --  1.7*  CREATININE 1.26*  --  1.14  TROPONINIHS 14 22*  --     Estimated Creatinine Clearance: 53.5 mL/min (by C-G formula based on SCr of 1.14 mg/dL).   Medical History: Past Medical History:  Diagnosis Date   A-fib ([place]) 2017   AICD (automatic cardioverter/defibrillator) present    Allergic state    Anxiety    Arrhythmia    atrial fib   Bacteremia    Basal cell carcinoma 08/25/2009   L mid helix    Basal cell carcinoma 08/17/2015   R distal dorsum nose    Basal cell carcinoma 11/22/2016   L infra auricular    BPH (benign prostatic hyperplasia)    CAD (coronary artery disease)    Cancer (HCC)    skin of the nose and arm   CHF (congestive heart failure) ([place])    Clotting disorder ([place]) 2009   Diabetes mellitus without complication ([place])    Diverticulitis    Family history of adverse reaction to anesthesia    daughter sleeps a long time   H/O diverticulitis of colon    Heart attack ([place]) 1985, 1995,2000, 2004,    multiple MI's   Heart disease     Heartburn    History of kidney stones    History of stomach ulcers    HLD (hyperlipidemia)    Hypertension    Hypothyroidism    Ischemic cardiomyopathy    Kidney stones    Neuropathy    Pacemaker    Post herpetic neuralgia    left side of face   Renal disorder    UTI's   Rosacea    Skin cancer 04/07/2013   L mid dorsum nose - sebaceous adenoma    Squamous cell carcinoma of skin 08/17/2015   L mid volar forearm - SCCIS    Squamous cell carcinoma of skin 08/21/2017   L lat forehead    Testosterone deficiency    Tremors of nervous system    Trigeminal neuralgia of left side of face    Ventricular tachycardia (HCC)     Medications:  Scheduled:   ALPRAZolam  0.5 mg Oral BID   amiodarone  400 mg Oral BID   Followed by   Derrill Memo ON 06/12/2021] amiodarone  200 mg Oral Daily   atorvastatin  40 mg Oral QPM   dexamethasone  6 mg Oral Q24H   empagliflozin  10 mg Oral Daily   gabapentin  600 mg Oral BID   insulin glargine-yfgn  15 Units Subcutaneous QHS   levothyroxine  100 mcg Oral Q0600   multivitamin with minerals  1 tablet Oral Daily   pantoprazole  40 mg Oral Daily   senna  1 tablet Oral BID   spironolactone  25 mg Oral Daily   warfarin  2.5 mg Oral Once per day on Sun Mon Wed Thu Fri Sat   [START ON 06/06/2021] warfarin  5 mg Oral Q Tue-1800   Warfarin - Pharmacist Dosing Inpatient   Does not apply q1600    Assessment: Patient presents to ER with flu like symptoms, weakness and fall. PMH includes HFrEF (EF 25%) s/p ICD, A. fib on warfarin, apical mural thrombus, HTN, moderate MR, CAD s/p CABG times 10/01/1993, history of VT, type 2 diabetes, and hyperlipidemia.  This patients CHA2DS2-VASc Score and unadjusted Ischemic Stroke Rate (% per year) is equal to 9.7 % stroke rate/year from a score of 6. No history of CVA/TIA noted. NO bridging needed at this time.  PTA warfarin dose warfarin 2.5mg  daily except for 5 mg every Tuesday.   New DDI includes amiodarone loading and  remdesivir started on admission.  Goal of Therapy:  INR 2-3 Monitor platelets by anticoagulation protocol: Yes  Relevant labs Date INR Comments 11/20 2.1 Admitted, warfarin ordered by MD 11/21 1.7 PharmD consulted, warfarin 5mg     Plan:  INR subtherapeutic today Will order Warfarin 5mg  today x 1 only INR and CBC tomorrow morning Pharmacy will follow and adjust dose as needed  Brelyn Woehl Rodriguez-Guzman PharmD, BCPS 06/05/2021 12:23 PM

## 2021-06-05 NOTE — Discharge Summary (Signed)
Discharge Summary  Dakota Thomas XLK:[id number] DOB: [birth year]  PCP: Dakota Harrier, MD  Admit date: 06/04/2021 Discharge date: 06/05/2021  Time spent: 25 minutes  Recommendations for Outpatient Follow-up:  Patient will follow up with his cardiologist in a few weeks. He will follow-up with his PCP in a few weeks. Medication change: Amiodarone changed to 400 mg p.o. twice daily x7 days, then resume normal home dose of 100 mg p.o. daily  Discharge Diagnoses:  Active Hospital Problems   Diagnosis Date Noted   Acute respiratory disease due to COVID-19 virus 06/04/2021   Non-sustained ventricular tachycardia 36/64/4034   Chronic systolic CHF (congestive heart failure) ([place]) 06/04/2021   LV (left ventricular) mural thrombus 06/04/2021   Diabetes ([place]) 11/22/2015   CAD (coronary artery disease) 11/22/2015   BPH (benign prostatic hyperplasia) 11/22/2015   A-fib ([place]) 11/22/2015    Resolved Hospital Problems  No resolved problems to display.    Discharge Condition: Improved, being discharged home  Diet recommendation: Heart healthy  Vitals:   06/05/21 1315 06/05/21 1330  BP:  114/70  Pulse:    Resp: 17 (!) 22  Temp:    SpO2:      History of present illness:  87 year old male with past medical history of systolic CHF with ejection fraction 25% and apical mural left ventricular thrombus on Coumadin plus atrial fibrillation and diabetes mellitus who presented to the emergency room on 11/20 with weakness and fall.  On route by paramedics, patient developed V. tach.  In the emergency room, patient seen by cardiology and given IV amiodarone and also found to be positive for COVID-19.  Admitted to the hospitalist service and started on steroids and Remdisivir.  Hospital Course:    Acute respiratory disease due to COVID-19 virus: Initially placed on steroids and Remdisivir.  Patient breathing comfortably on room air at 97%.  Given symptoms more cardiac related, COVID more of an  incidental finding in this patient.  Therefore, he does not need 3 days of Remdisivir.  Felt to be stable for discharge.  Cleared by cardiology.  See below. Active Problems:   Diabetes ([place]): CBG stable.     CAD (coronary artery disease): Stable.     A-fib [name]): Appreciate cardiology assistance.  Rate controlled now.  INR slightly subtherapeutic at 1.7, so patient will take 5 mg this afternoon and then resume home dose of 2.5 mg daily except for Tuesday nights.     BPH (benign prostatic hyperplasia): Continue home medications.     Non-sustained ventricular tachycardia: Appreciate cardiology help.  Known history of VT in the past.  Transitioning from IV amiodarone to p.o. loading for next week at 400mg  bid and then, resume 100 mg daily.  Follow-up with cardiology as outpatient.     Chronic systolic CHF (congestive heart failure) ([place]): No evidence of volume overload.     LV (left ventricular) mural thrombus: Continue anticoagulation.  Consultants: Cardiology   Procedures: None   Discharge Exam: BP 114/70   Pulse 64   Temp 98.9 F (37.2 C) (Oral)   Resp (!) 22   Ht 6\' 1"  (1.854 m)   Wt 83.9 kg   SpO2 92%   BMI 24.41 kg/m   General: Alert and oriented x3, no acute distress Cardiovascular: Irregular rhythm, rate controlled Respiratory: Clear to auscultation bilaterally  Discharge Instructions You were cared for by a hospitalist during your hospital stay. If you have any questions about your discharge medications or the care you received while you were in the  hospital after you are discharged, you can call the unit and asked to speak with the hospitalist on call if the hospitalist that took care of you is not available. Once you are discharged, your primary care physician will handle any further medical issues. Please note that NO REFILLS for any discharge medications will be authorized once you are discharged, as it is imperative that you return to your primary care physician (or  establish a relationship with a primary care physician if you do not have one) for your aftercare needs so that they can reassess your need for medications and monitor your lab values.  Discharge Instructions     Diet - low sodium heart healthy   Complete by: As directed    Increase activity slowly   Complete by: As directed       Allergies as of 06/05/2021       Reactions   Levaquin [levofloxacin] Swelling   Swelling of throat    Ciprofloxacin Other (See Comments)   Makes him feel horrible, flu-like symptoms   Lidocaine Other (See Comments)   IV infusion "caused problems"   Metformin Other (See Comments)   Esophageal Spasms   Tannic Acid Hives   Tamsulosin Hcl Other (See Comments)   Cough, stomach pains, low blood pressure        Medication List     STOP taking these medications    metoprolol succinate 25 MG 24 hr tablet Commonly known as: TOPROL-XL       TAKE these medications    ALPRAZolam 0.5 MG tablet Commonly known as: XANAX Take 0.5 mg by mouth 2 (two) times daily.   amiodarone 400 MG tablet Commonly known as: PACERONE Take 400 mg p.o. twice daily x7 days, then resume 100 mg p.o. daily What changed:  medication strength how much to take how to take this when to take this additional instructions   atorvastatin 40 MG tablet Commonly known as: LIPITOR Take 40 mg by mouth every evening.   empagliflozin 10 MG Tabs tablet Commonly known as: JARDIANCE Take by mouth daily.   gabapentin 600 MG tablet Commonly known as: NEURONTIN Take 600 mg by mouth 2 (two) times daily.   insulin aspart 100 UNIT/ML FlexPen Commonly known as: NOVOLOG Inject 0-6 Units into the skin 2 (two) times daily before a meal. Dose per sliding scale   insulin glargine 100 unit/mL Sopn Commonly known as: LANTUS Inject 25 Units into the skin at bedtime.   levothyroxine 100 MCG tablet Commonly known as: SYNTHROID Take 100 mcg by mouth daily before breakfast.    multivitamin with minerals Tabs tablet Take 1 tablet by mouth daily.   omeprazole 20 MG capsule Commonly known as: PRILOSEC Take 20 mg by mouth See admin instructions. Takes 20mg  every morning. Takes an additional 20mg  in the evening as needed for acid reflux   potassium gluconate 595 (99 K) MG Tabs tablet Take 1 tablet by mouth every other day.   spironolactone 25 MG tablet Commonly known as: ALDACTONE Take 25 mg by mouth daily.   testosterone cypionate 200 MG/ML injection Commonly known as: DEPOTESTOSTERONE CYPIONATE INJECT 200mg (1 ML) IN THE MUSCLE EVERY 28 DAYS   traMADol 50 MG tablet Commonly known as: ULTRAM Take by mouth as needed.   warfarin 2.5 MG tablet Commonly known as: COUMADIN Take 2.5 mg by mouth daily. Pt taking 2.5 daily except tuesdays; taking 5 mg tuesdays       Allergies  Allergen Reactions   Levaquin [Levofloxacin] Swelling  Swelling of throat    Ciprofloxacin Other (See Comments)    Makes him feel horrible, flu-like symptoms   Lidocaine Other (See Comments)    IV infusion "caused problems"   Metformin Other (See Comments)    Esophageal Spasms   Tannic Acid Hives   Tamsulosin Hcl Other (See Comments)    Cough, stomach pains, low blood pressure      The results of significant diagnostics from this hospitalization (including imaging, microbiology, ancillary and laboratory) are listed below for reference.    Significant Diagnostic Studies: DG Chest Portable 1 View  Result Date: 06/04/2021 CLINICAL DATA:  Shortness of breath EXAM: PORTABLE CHEST 1 VIEW COMPARISON:  05/22/2016 FINDINGS: Transverse diameter of heart is slightly increased. There are no signs of pulmonary edema. There is poor inspiration. Linear densities seen in the left lower lung fields. There is no focal pulmonary consolidation. Pacemaker/defibrillator battery is seen in the left infraclavicular region with tips of leads in the right atrium and right ventricle. Metallic sutures  are seen in the sternum, possibly suggesting previous coronary bypass surgery. There is calcified granuloma in the medial left lower lung fields. IMPRESSION: There are no signs of pulmonary edema or focal pulmonary consolidation. Linear densities in the left lower lung fields suggest subsegmental atelectasis. Electronically Signed   By: Elmer Picker M.D.   On: 06/04/2021 14:21    Microbiology: Recent Results (from the past 240 hour(s))  Blood culture (routine x 2)     Status: None (Preliminary result)   Collection Time: 06/04/21  1:53 PM   Specimen: BLOOD  Result Value Ref Range Status   Specimen Description BLOOD RIGHT ANTECUBITAL  Final   Special Requests   Final    BOTTLES DRAWN AEROBIC AND ANAEROBIC Blood Culture results may not be optimal due to an excessive volume of blood received in culture bottles   Culture   Final    NO GROWTH < 24 HOURS Performed at [facility], [address], [place], [place] [address]    Report Status PENDING  Incomplete  Resp Panel by RT-PCR (Flu A&B, Covid) Nasopharyngeal Swab     Status: Abnormal   Collection Time: 06/04/21  1:53 PM   Specimen: Nasopharyngeal Swab; Nasopharyngeal(NP) swabs in vial transport medium  Result Value Ref Range Status   SARS Coronavirus 2 by RT PCR POSITIVE (A) NEGATIVE Final    Comment: RESULT CALLED TO, READ BACK BY AND VERIFIED WITH: STEPHANIE RUDD AT 6222 06/04/21.PMF (NOTE) SARS-CoV-2 target nucleic acids are DETECTED.  The SARS-CoV-2 RNA is generally detectable in upper respiratory specimens during the acute phase of infection. Positive results are indicative of the presence of the identified virus, but do not rule out bacterial infection or co-infection with other pathogens not detected by the test. Clinical correlation with patient history and other diagnostic information is necessary to determine patient infection status. The expected result is Negative.  Fact Sheet for  Patients: EntrepreneurPulse.com.au  Fact Sheet for Healthcare Providers: IncredibleEmployment.be  This test is not yet approved or cleared by the [place] FDA and  has been authorized for detection and/or diagnosis of SARS-CoV-2 by FDA under an Emergency Use Authorization (EUA).  This EUA will remain in effect (meaning this test can  be used) for the duration of  the COVID-19 declaration under Section 564(b)(1) of the Act, 21 U.S.C. section 360bbb-3(b)(1), unless the authorization is terminated or revoked sooner.     Influenza A by PCR NEGATIVE NEGATIVE Final   Influenza B by PCR  NEGATIVE NEGATIVE Final    Comment: (NOTE) The Xpert Xpress SARS-CoV-2/FLU/RSV plus assay is intended as an aid in the diagnosis of influenza from Nasopharyngeal swab specimens and should not be used as a sole basis for treatment. Nasal washings and aspirates are unacceptable for Xpert Xpress SARS-CoV-2/FLU/RSV testing.  Fact Sheet for Patients: EntrepreneurPulse.com.au  Fact Sheet for Healthcare Providers: IncredibleEmployment.be  This test is not yet approved or cleared by the [place] FDA and has been authorized for detection and/or diagnosis of SARS-CoV-2 by FDA under an Emergency Use Authorization (EUA). This EUA will remain in effect (meaning this test can be used) for the duration of the COVID-19 declaration under Section 564(b)(1) of the Act, 21 U.S.C. section 360bbb-3(b)(1), unless the authorization is terminated or revoked.  Performed at [facility], [address] [place], [place] [address]   Blood culture (routine x 2)     Status: None (Preliminary result)   Collection Time: 06/04/21  4:41 PM   Specimen: BLOOD  Result Value Ref Range Status   Specimen Description BLOOD BLOOD LEFT FOREARM  Final   Special Requests   Final    BOTTLES DRAWN AEROBIC AND ANAEROBIC Blood Culture adequate volume    Culture   Final    NO GROWTH < 24 HOURS Performed at [facility], [address] [place],  [address]    Report Status PENDING  Incomplete     Labs: Basic Metabolic Panel: Recent Labs  Lab 06/04/21 1353 06/05/21 0611  NA 134* 135  K 3.8 4.2  CL 101 102  CO2 22 24  GLUCOSE 152* 183*  BUN 14 16  CREATININE 1.26* 1.14  CALCIUM 8.8* 8.6*  MG 1.9  --    Liver Function Tests: Recent Labs  Lab 06/04/21 1353  AST 34  ALT 21  ALKPHOS 76  BILITOT 1.2  PROT 7.8  ALBUMIN 4.3   No results for input(s): LIPASE, AMYLASE in the last 168 hours. No results for input(s): AMMONIA in the last 168 hours. CBC: Recent Labs  Lab 06/04/21 1353 06/05/21 0611  WBC 7.2 7.0  NEUTROABS 5.3  --   HGB 15.9 15.2  HCT 48.3 45.6  MCV 97.6 97.0  PLT 119* 108*   Cardiac Enzymes: No results for input(s): CKTOTAL, CKMB, CKMBINDEX, TROPONINI in the last 168 hours. BNP: BNP (last 3 results) Recent Labs    06/04/21 1353  BNP 84.7    ProBNP (last 3 results) No results for input(s): PROBNP in the last 8760 hours.  CBG: No results for input(s): GLUCAP in the last 168 hours.     Signed:  Annita Brod, MD [facility] 06/05/2021, 3:22 PM

## 2021-06-09 LAB — CULTURE, BLOOD (ROUTINE X 2)
Culture: NO GROWTH
Culture: NO GROWTH
Special Requests: ADEQUATE

## 2021-06-13 DIAGNOSIS — M25552 Pain in left hip: Secondary | ICD-10-CM | POA: Diagnosis not present

## 2021-06-13 DIAGNOSIS — U071 COVID-19: Secondary | ICD-10-CM | POA: Diagnosis not present

## 2021-06-13 DIAGNOSIS — I480 Paroxysmal atrial fibrillation: Secondary | ICD-10-CM | POA: Diagnosis not present

## 2021-06-13 DIAGNOSIS — J208 Acute bronchitis due to other specified organisms: Secondary | ICD-10-CM | POA: Diagnosis not present

## 2021-06-13 DIAGNOSIS — Z09 Encounter for follow-up examination after completed treatment for conditions other than malignant neoplasm: Secondary | ICD-10-CM | POA: Diagnosis not present

## 2021-06-13 DIAGNOSIS — I5022 Chronic systolic (congestive) heart failure: Secondary | ICD-10-CM | POA: Diagnosis not present

## 2021-06-20 DIAGNOSIS — I513 Intracardiac thrombosis, not elsewhere classified: Secondary | ICD-10-CM | POA: Diagnosis not present

## 2021-06-20 DIAGNOSIS — E782 Mixed hyperlipidemia: Secondary | ICD-10-CM | POA: Diagnosis not present

## 2021-06-20 DIAGNOSIS — I472 Ventricular tachycardia, unspecified: Secondary | ICD-10-CM | POA: Diagnosis not present

## 2021-06-20 DIAGNOSIS — I480 Paroxysmal atrial fibrillation: Secondary | ICD-10-CM | POA: Diagnosis not present

## 2021-06-20 DIAGNOSIS — I10 Essential (primary) hypertension: Secondary | ICD-10-CM | POA: Diagnosis not present

## 2021-06-20 DIAGNOSIS — I5022 Chronic systolic (congestive) heart failure: Secondary | ICD-10-CM | POA: Diagnosis not present

## 2021-06-20 DIAGNOSIS — I340 Nonrheumatic mitral (valve) insufficiency: Secondary | ICD-10-CM | POA: Diagnosis not present

## 2021-06-20 DIAGNOSIS — I25810 Atherosclerosis of coronary artery bypass graft(s) without angina pectoris: Secondary | ICD-10-CM | POA: Diagnosis not present

## 2021-06-20 DIAGNOSIS — I255 Ischemic cardiomyopathy: Secondary | ICD-10-CM | POA: Diagnosis not present

## 2021-06-29 DIAGNOSIS — Z7901 Long term (current) use of anticoagulants: Secondary | ICD-10-CM | POA: Diagnosis not present

## 2021-07-25 DIAGNOSIS — I420 Dilated cardiomyopathy: Secondary | ICD-10-CM | POA: Diagnosis not present

## 2021-07-28 DIAGNOSIS — H6123 Impacted cerumen, bilateral: Secondary | ICD-10-CM | POA: Diagnosis not present

## 2021-07-28 DIAGNOSIS — H8111 Benign paroxysmal vertigo, right ear: Secondary | ICD-10-CM | POA: Diagnosis not present

## 2021-07-28 DIAGNOSIS — H8112 Benign paroxysmal vertigo, left ear: Secondary | ICD-10-CM | POA: Diagnosis not present

## 2021-08-01 DIAGNOSIS — Z7901 Long term (current) use of anticoagulants: Secondary | ICD-10-CM | POA: Diagnosis not present

## 2021-08-04 NOTE — Patient Outreach (Signed)
Received a referral for Mr. Swickard from [place]. I have assigned Enzo Montgomery, RN to  outreach for any Care Coordination needs within 10 business days.     [facility], [place], [place] Management [phone]

## 2021-08-07 ENCOUNTER — Other Ambulatory Visit: Payer: Self-pay

## 2021-08-07 NOTE — Patient Outreach (Signed)
[facility]) Care Management  08/07/2021  [name] 05/18/1934 [id number]    Telephone Screen  Referral Date: 08/04/2021 Referral Source: [facility] CM  Referral Reason: "seeking guidance with would like to be screened for CM due to Diabetes with insulin therapy, heart disease with history of multiple MI's, Warfarin therapy, Pacemaker/Defibrillator, back issues that required back shots for years and was inoperable, and back in November was hospitalized for [place] and could use help of a CM to navigate the health system, education, guidance, and maximize use of my benefits. Educated on [employer], offered program referral, mbr agreed. "    Outreach attempt # 1 to patient. Successful outreach call to patient. Discussed and reviewed referral source and reason. Patient reports he would like to get signed up with [facility] services to have in case he ever has any medical issues but voices no RN CM needs or concerns at this time.    Social: He resides in his home along with spouse. States he has two daughters who are able to assist if ever needed. Patient independent with all ADLs/IADLs. Denies any recent falls. He drives himself to appts. RN CM discussed and reviewed [employer] transportation as an alternative if ever needed. Patient was appreciative of info and will keep in mind.    Conditions: Per chart review, patient has PMH that includes but not limited to DM, A-fib,CHF, HTN, HLD,CABG, CAD, BPH, AICD placement, MI,COVID-19, dumping syndrome, facial neuralgia and skin cancer. Patient very knowledgeable regarding his medical history and conditions.He is able to manage his conditions well. 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   Medications: Med review completed with patient. Patient denies any issues managing and/or affording meds at this time.   Medications Reviewed Today     Reviewed by Hayden Pedro, RN (Registered Nurse) on 08/07/21  at [place] List Status: <None>   Medication Order Taking? Sig Documenting Provider Last Dose Status Informant  ALPRAZolam (XANAX) 0.5 MG tablet [id number]  Take 0.5 mg by mouth 2 (two) times daily. [provider]  Active Self  amiodarone (PACERONE) 400 MG tablet [id number]  Take 400 mg p.o. twice daily x7 days, then resume 100 mg p.o. daily  Patient taking differently: Pt taking only (100mg )   Annita Brod, MD  Active Self  atorvastatin (LIPITOR) 40 MG tablet [id number]  Take 40 mg by mouth every evening.  [provider]  Active Self  Calcium Citrate-Vitamin D (CALCIUM CITRATE + D3 MAXIMUM PO) [id number] Yes Take 1 tablet by mouth. [provider]  Active Self  empagliflozin (JARDIANCE) 10 MG TABS tablet [id number]  Take by mouth daily. [provider]  Active Self  gabapentin (NEURONTIN) 600 MG tablet [id number]  Take 600 mg by mouth 2 (two) times daily.  [provider]  Active Self           Med Note Windle Guard, JENNIFER L   Thu Nov 24, 2015 10:02 AM)    insulin aspart (NOVOLOG) 100 UNIT/ML FlexPen [id number]  Inject 0-6 Units into the skin 2 (two) times daily before a meal. Dose per sliding scale [provider]  Active Self  insulin glargine (LANTUS) 100 unit/mL SOPN [id number]  Inject 26 Units into the skin at bedtime. Pt states taking 26 units [provider]  Active Self           Med Note Quentin Cornwall, PATRICIA A   Tue Mar 19, 2017  3:12 PM) Took 12  units.  levothyroxine (SYNTHROID, LEVOTHROID) 100 MCG tablet [id number]  Take 100 mcg by mouth daily before breakfast. [provider]  Active Self  loperamide (IMODIUM A-D) 2 MG capsule [id number] Yes Take by mouth as needed for diarrhea or loose stools. [provider]  Active Self  Multiple Vitamin (MULTIVITAMIN WITH MINERALS) TABS tablet [id number]  Take 1 tablet by mouth daily. [provider]  Active Self  omeprazole (PRILOSEC) 20 MG capsule [id number]   Take 20 mg by mouth See admin instructions. Takes 20mg  every morning. Takes an additional 20mg  in the evening as needed for acid reflux [provider]  Active Self  potassium gluconate 595 (99 K) MG TABS tablet [id number]  Take 1 tablet by mouth every other day. [provider]  Active Self  spironolactone (ALDACTONE) 25 MG tablet [id number]  Take 25 mg by mouth daily. [provider]  Active Self           Med Note Rodney Cruise   Wed Nov 21, 2016 11:48 PM)    testosterone cypionate (DEPOTESTOSTERONE CYPIONATE) 200 MG/ML injection [id number]  INJECT 200mg (1 ML) IN THE MUSCLE EVERY 28 DAYS [provider]  Active Self  traMADol (ULTRAM) 50 MG tablet [id number]  Take by mouth as needed. [provider]  Active Self  warfarin (COUMADIN) 2.5 MG tablet [id number]  Take 2.5 mg by mouth daily. Pt taking 2.5 daily except tuesdays; taking 5 mg tuesdays [provider]  Active Self            Depression screen [facility] 2/9 08/07/2021  Decreased Interest 0  Down, Depressed, Hopeless 0  PHQ - 2 Score 0    Fall Risk 05/02/2020 08/24/2020 06/04/2021 06/05/2021 08/07/2021  Falls in the past year? - - - - 1  Was there an injury with Fall? - - - - 1  Fall Risk Category Calculator - - - - 2  Fall Risk Category - - - - Moderate  Patient Fall Risk Level Low fall risk High fall risk Moderate fall risk Low fall risk Moderate fall risk  Patient at Risk for Falls Due to - - - - History of fall(s);Medication side effect  Fall risk Follow up - - - - Falls evaluation completed    SDOH Screenings   Alcohol Screen: Not on file  Depression (PHQ2-9): Low Risk    PHQ-2 Score: 0  Financial Resource Strain: Not on file  Food Insecurity: No Food Insecurity   Worried About Charity fundraiser in the Last Year: Never true   Ran Out of Food in the Last Year: Never true  Housing: Not on file  Physical Activity: Not on file  Social Connections: Not on file  Stress:  Not on file  Tobacco Use: Medium Risk   Smoking Tobacco Use: Former   Smokeless Tobacco Use: Never   Passive Exposure: Not on file  Transportation Needs: No Transportation Needs   Lack of Transportation (Medical): No   Lack of Transportation (Non-Medical): No   Care Plan : Consulting civil engineer POC  Updates made by Hayden Pedro, RN since 08/07/2021 12:00 AM     Problem: Chronic Disease Mgmt of Chronic Condition- DM   Priority: High     Long-Range Goal: Development of POC for Mgmt of Chronic Condition-DM   Start Date: 08/07/2021  Expected End Date: 08/07/2022  Priority: High  Note:   Current Barriers:  Chronic Disease Management support and education needs related to DMII   RNCM Clinical  Goal(s):  Patient will verbalize understanding of plan for management of DMII as evidenced by mgmt of chronic condition demonstrate Ongoing health management independence as evidenced by A1C level within normal range continue to work with RN Care Manager to address care management and care coordination needs related to  DMII as evidenced by adherence to CM Team Scheduled appointments through collaboration with RN Care manager, provider, and care team.   Interventions: POC sent to PCP upon initial assessment, quarterly and with any changes in patient's conditions Inter-disciplinary care team collaboration (see longitudinal plan of care) Evaluation of current treatment plan related to  self management and patient's adherence to plan as established by provider   Diabetes Interventions:  (Status:  New goal.) Long Term Goal Assessed patient's understanding of A1c goal: <7% Provided education to patient about basic DM disease process Reviewed medications with patient and discussed importance of medication adherence Lab Results  Component Value Date   HGBA1C 6.9 (H) 11/22/2015   Patient Goals/Self-Care Activities: Take all medications as prescribed Attend all scheduled provider  appointments Call provider office for new concerns or questions  check blood sugar at prescribed times: twice daily take the blood sugar log to all doctor visits  Follow Up Plan:  Telephone follow up appointment with care management team member scheduled for:  within the month of March The patient has been provided with contact information for the care management team and has been advised to call with any health related questions or concerns.         Advance Directives: Patient reports he has living will and HC{POA. No copy on file. Advised to provide medical team with copy.   Consent:[facility] services reviewed and discussed with patient. Verbal consent for services given.   Plan: RN CM discussed with patient next outreach within the month of March.Patient agrees to care plan and follow up. RN CM will send barriers letter and route encounter to PCP. RN CM will send welcome letter to patient.  Dakota Montgomery, RN,BSN,CCM [place] Management Telephonic Care Management Coordinator Direct Phone: [phone] Toll Free: [phone] Fax: [phone]

## 2021-08-15 ENCOUNTER — Other Ambulatory Visit: Payer: Self-pay

## 2021-08-15 NOTE — Patient Outreach (Signed)
[facility]) Care Management  08/15/2021  GRIFFYN KUCINSKI 05-20-1934 [id number]   Care Coordination    Voicemail message received from patient stating that he received RN CM letter in the mail but has a question. Return call to patient. Spoke with patient who stets he does not think he is eligible for services since his PCP is with [facility] and not [facility]. Explained to patient that PCP is a part of the [facility] network of physicians and that patient is eligable for services. He voiced understanding and appreciation. Patient rperot he will sign consent and mail form back for [facility] services.     Plan: RN CM will make outreach to patient as previously scheduled.  Enzo Montgomery, RN,BSN,CCM [place] Management Telephonic Care Management Coordinator Direct Phone: [phone] Toll Free: [phone] Fax: [phone]

## 2021-09-04 DIAGNOSIS — Z7901 Long term (current) use of anticoagulants: Secondary | ICD-10-CM | POA: Diagnosis not present

## 2021-09-05 DIAGNOSIS — Z0100 Encounter for examination of eyes and vision without abnormal findings: Secondary | ICD-10-CM | POA: Diagnosis not present

## 2021-09-05 DIAGNOSIS — H524 Presbyopia: Secondary | ICD-10-CM | POA: Diagnosis not present

## 2021-09-15 ENCOUNTER — Other Ambulatory Visit: Payer: Self-pay

## 2021-09-15 NOTE — Patient Outreach (Signed)
[place] [facility]) Care Management ? ?09/15/2021 ? ?Dakota Thomas ?August 27, 1933 ?[id number] ? ? ?Telephone Assessment ? ? ? ? ?Message received from [place] that patient requesting a call. Outreach call placed to patient. Patient reports that he is doing fairly well. He is not available on previously scheduled appt date with RN CM . He did notice a small "blue spot on right sole of foot this morning. He is not sure how it got there. He does check his feet daily. He reports he does not like to wear shoes or socks. Diabetic foot education provided to patient. He is aware to follow up with MD as needed. Denies any RN CM needs or concerns at this time.  ? ? ?Medications Reviewed Today   ? ? Reviewed by Hayden Pedro, RN (Registered Nurse) on 09/15/21 at [phone]  Med List Status: <None>  ? ?Medication Order Taking? Sig Documenting Provider Last Dose Status Informant  ?ALPRAZolam (XANAX) 0.5 MG tablet [id number] No Take 0.5 mg by mouth 2 (two) times daily. [provider] 06/04/2021 0900 Active Self  ?amiodarone (PACERONE) 400 MG tablet [id number]  Take 400 mg p.o. twice daily x7 days, then resume 100 mg p.o. daily  ?Patient taking differently: Pt taking only (100mg )  ? Annita Brod, MD  Active Self  ?atorvastatin (LIPITOR) 40 MG tablet [id number] No Take 40 mg by mouth every evening.  [provider] 06/03/2021 1800 Active Self  ?Calcium Citrate-Vitamin D (CALCIUM CITRATE + D3 MAXIMUM PO) [id number]  Take 1 tablet by mouth. [provider]  Active Self  ?empagliflozin (JARDIANCE) 10 MG TABS tablet [id number] No Take by mouth daily. [provider] 06/04/2021 0900 Active Self  ?gabapentin (NEURONTIN) 600 MG tablet [id number] No Take 600 mg by mouth 2 (two) times daily.  [provider] 06/04/2021 0900 Active Self  ?         ?Med Note [place] Bing Nov 24, 2015 10:02 AM)    ?insulin aspart (NOVOLOG) 100 UNIT/ML FlexPen [id number] No Inject 0-6 Units  into the skin 2 (two) times daily before a meal. Dose per sliding scale [provider] 06/04/2021 0900 Active Self  ?insulin glargine (LANTUS) 100 unit/mL SOPN [id number] No Inject 26 Units into the skin at bedtime. Pt states taking 26 units [provider] 06/03/2021 1800 Active Self  ?         ?Med Note Quentin Cornwall, PATRICIA A   Tue Mar 19, 2017  3:12 PM) Took 12 units.  ?levothyroxine (SYNTHROID, LEVOTHROID) 100 MCG tablet [id number] No Take 100 mcg by mouth daily before breakfast. [provider] 06/04/2021 0900 Active Self  ?loperamide (IMODIUM A-D) 2 MG capsule [id number]  Take by mouth as needed for diarrhea or loose stools. [provider]  Active Self  ?Multiple Vitamin (MULTIVITAMIN WITH MINERALS) TABS tablet [id number] No Take 1 tablet by mouth daily. [provider] 06/04/2021 0900 Active Self  ?omeprazole (PRILOSEC) 20 MG capsule [id number] No Take 20 mg by mouth See admin instructions. Takes 20mg  every morning. Takes an additional 20mg  in the evening as needed for acid reflux [provider] 06/04/2021 0900 Active Self  ?potassium gluconate 595 (99 K) MG TABS tablet [id number] No Take 1 tablet by mouth every other day. [provider] 06/02/2021 Active Self  ?spironolactone (ALDACTONE) 25 MG tablet [id number] No Take 25 mg by mouth daily. [provider] 06/04/2021 Active Self  ?         ?Med Note Ginette Pitman, Erlene Quan  J   Wed Nov 21, 2016 11:48 PM)    ?testosterone cypionate (DEPOTESTOSTERONE CYPIONATE) 200 MG/ML injection [id number] No INJECT 200mg (1 ML) IN THE MUSCLE EVERY 28 DAYS [provider] Past Month Active Self  ?traMADol (ULTRAM) 50 MG tablet [id number] No Take by mouth as needed. [provider] 06/04/2021 0900 Active Self  ?warfarin (COUMADIN) 2.5 MG tablet [id number] No Take 2.5 mg by mouth daily. Pt taking 2.5 daily except tuesdays; taking 5 mg tuesdays [provider] 06/03/2021 Active Self  ? ?  ?  ? ?   ?  ? ?Care Plan : RN Care Manager POC  ?Updates made by Hayden Pedro, RN since 09/15/2021 12:00 AM  ?  ? ?Problem: Chronic Disease Mgmt of Chronic Condition- DM   ?Priority: High  ?  ? ?Long-Range Goal: Development of POC for Mgmt of Chronic Condition-DM   ?Start Date: 08/07/2021  ?Expected End Date: 08/07/2022  ?This Visit's Progress: On track  ?Priority: High  ?Note:   ?Current Barriers:  ?Chronic Disease Management support and education needs related to DMII  ? ?RNCM Clinical Goal(s):  ?Patient will verbalize understanding of plan for management of DMII as evidenced by mgmt of chronic condition ?demonstrate Ongoing health management independence as evidenced by A1C level within normal range ?continue to work with RN Care Manager to address care management and care coordination needs related to  DMII as evidenced by adherence to CM Team Scheduled appointments through collaboration with RN Care manager, provider, and care team.  ? ?Interventions: ?POC sent to PCP upon initial assessment, quarterly and with any changes in patient's conditions ?Inter-disciplinary care team collaboration (see longitudinal plan of care) ?Evaluation of current treatment plan related to  self management and patient's adherence to plan as established by provider ? ? ?Diabetes Interventions:  (Status:  Goal on track:  Yes.) Long Term Goal ?Assessed patient's understanding of A1c goal: <7% ?Provided education to patient about basic DM disease process ?Reviewed medications with patient and discussed importance of medication adherence ?Lab Results  ?Component Value Date  ? HGBA1C 6.9 (H) 11/22/2015  ?09/15/21-Patient reports that his blood sugars have bene controlled. Blood sugar this morning was 110. ?Patient Goals/Self-Care Activities: ?Take all medications as prescribed ?Attend all scheduled provider appointments ?Call provider office for new concerns or questions  ?check blood sugar at prescribed times: twice daily ?take the  blood sugar log to all doctor visits ?Check feet daily for any changes ?Consider making an appt with podiatrist ? ?Follow Up Plan:  Telephone follow up appointment with care management team member scheduled for:  within the month of April ?The patient has been provided with contact information for the care management team and has been advised to call with any health related questions or concerns.   ?  ?  ?Plan: ?RN CM discussed with patient next outreach within the month of April. Patient agrees to care plan and follow up. ? ?Enzo Montgomery, RN,BSN,CCM ?[facility] Care Management ?Telephonic Care Management Coordinator ?Direct Phone: [phone] ?Toll Free: [phone] ?Fax: [phone] ? ? ?

## 2021-09-19 ENCOUNTER — Ambulatory Visit: Payer: Medicare HMO

## 2021-10-02 DIAGNOSIS — Z7901 Long term (current) use of anticoagulants: Secondary | ICD-10-CM | POA: Diagnosis not present

## 2021-10-09 DIAGNOSIS — I10 Essential (primary) hypertension: Secondary | ICD-10-CM | POA: Diagnosis not present

## 2021-10-09 DIAGNOSIS — I513 Intracardiac thrombosis, not elsewhere classified: Secondary | ICD-10-CM | POA: Diagnosis not present

## 2021-10-09 DIAGNOSIS — Z95810 Presence of automatic (implantable) cardiac defibrillator: Secondary | ICD-10-CM | POA: Diagnosis not present

## 2021-10-09 DIAGNOSIS — I472 Ventricular tachycardia, unspecified: Secondary | ICD-10-CM | POA: Diagnosis not present

## 2021-10-09 DIAGNOSIS — I25810 Atherosclerosis of coronary artery bypass graft(s) without angina pectoris: Secondary | ICD-10-CM | POA: Diagnosis not present

## 2021-10-09 DIAGNOSIS — E782 Mixed hyperlipidemia: Secondary | ICD-10-CM | POA: Diagnosis not present

## 2021-10-09 DIAGNOSIS — R0602 Shortness of breath: Secondary | ICD-10-CM | POA: Diagnosis not present

## 2021-10-09 DIAGNOSIS — I480 Paroxysmal atrial fibrillation: Secondary | ICD-10-CM | POA: Diagnosis not present

## 2021-10-09 DIAGNOSIS — I5022 Chronic systolic (congestive) heart failure: Secondary | ICD-10-CM | POA: Diagnosis not present

## 2021-10-16 DIAGNOSIS — R791 Abnormal coagulation profile: Secondary | ICD-10-CM | POA: Diagnosis not present

## 2021-10-30 DIAGNOSIS — R791 Abnormal coagulation profile: Secondary | ICD-10-CM | POA: Diagnosis not present

## 2021-11-01 ENCOUNTER — Ambulatory Visit: Payer: Self-pay

## 2021-11-02 ENCOUNTER — Other Ambulatory Visit: Payer: Self-pay

## 2021-11-02 NOTE — Patient Outreach (Signed)
[place] [facility] [facility]) Care Management ? ?11/02/2021 ? ?Dakota Thomas ?[birth year] ?[id number] ? ? ?Care Coordination ? ? ?RN CM received voicemail message from [place] at [facility] stating that patient called to verify benefit for chronic meals programs. Per [facility] rep patient is eligible and that RN CM can proceed with calling [employer] to arrange delivery.RN CM contacted patient to obtain neccessary food allergies and other info.  ? ?RN CM placed call to [employer]. Advised that patient was not eligible for chronic meals program but only post discharge meal program. Return call to patient and advised. He states that he will call [employer] and follow up. ? ? ? ? ?Plan: ?RN CM will outreach patient as previously discussed and scheduled. ? ?Enzo Montgomery, RN,BSN,CCM ?[facility] Care Management ?Telephonic Care Management Coordinator ?Direct Phone: [phone] ?Toll Free: [phone] ?Fax: [phone]' ? ?

## 2021-11-02 NOTE — Patient Outreach (Signed)
[place] [facility]) Care Management ? ?11/02/2021 ? ?Dakota Thomas ?[birth year] ?[id number] ? ? ?Telephone Assessment ? ? ? ?Successful outreach call to patient. He is pleased to report that things are going well for him . No new issues/ concerns at present. He continues to reside in home with supportive spouse. Appetite good. Wgt stable as well as blood sugars controlled at present. No recent falls. He has PCP appt next month and does not see cardiologist until end of year(Dec). Denies any RN CM needs or concerns at this time.  ? ? ?Medications Reviewed Today   ? ? Reviewed by Hayden Pedro, RN (Registered Nurse) on 11/02/21 at [phone]  Med List Status: <None>  ? ?Medication Order Taking? Sig Documenting Provider Last Dose Status Informant  ?ALPRAZolam (XANAX) 0.5 MG tablet [id number] No Take 0.5 mg by mouth 2 (two) times daily. [provider] 06/04/2021 0900 Active Self  ?amiodarone (PACERONE) 400 MG tablet [id number]  Take 400 mg p.o. twice daily x7 days, then resume 100 mg p.o. daily  ?Patient taking differently: Pt taking only ('100mg'$ )  ? Annita Brod, MD  Active Self  ?atorvastatin (LIPITOR) 40 MG tablet [id number] No Take 40 mg by mouth every evening.  [provider] 06/03/2021 1800 Active Self  ?Calcium Citrate-Vitamin D (CALCIUM CITRATE + D3 MAXIMUM PO) [id number]  Take 1 tablet by mouth. [provider]  Active Self  ?empagliflozin (JARDIANCE) 10 MG TABS tablet [id number] No Take by mouth daily. [provider] 06/04/2021 0900 Active Self  ?gabapentin (NEURONTIN) 600 MG tablet [id number] No Take 600 mg by mouth 2 (two) times daily.  [provider] 06/04/2021 0900 Active Self  ?         ?Med Note [place] Bing Nov 24, 2015 10:02 AM)    ?insulin aspart (NOVOLOG) 100 UNIT/ML FlexPen [id number] No Inject 0-6 Units into the skin 2 (two) times daily before a meal. Dose per sliding scale [provider] 06/04/2021 0900 Active  Self  ?insulin glargine (LANTUS) 100 unit/mL SOPN [id number] No Inject 26 Units into the skin at bedtime. Pt states taking 26 units [provider] 06/03/2021 1800 Active Self  ?         ?Med Note Quentin Cornwall, PATRICIA A   Tue Mar 19, 2017  3:12 PM) Took 12 units.  ?levothyroxine (SYNTHROID, LEVOTHROID) 100 MCG tablet [id number] No Take 100 mcg by mouth daily before breakfast. [provider] 06/04/2021 0900 Active Self  ?loperamide (IMODIUM A-D) 2 MG capsule [id number]  Take by mouth as needed for diarrhea or loose stools. [provider]  Active Self  ?Multiple Vitamin (MULTIVITAMIN WITH MINERALS) TABS tablet [id number] No Take 1 tablet by mouth daily. [provider] 06/04/2021 0900 Active Self  ?omeprazole (PRILOSEC) 20 MG capsule [id number] No Take 20 mg by mouth See admin instructions. Takes '20mg'$  every morning. Takes an additional '20mg'$  in the evening as needed for acid reflux [provider] 06/04/2021 0900 Active Self  ?potassium gluconate 595 (99 K) MG TABS tablet [id number] No Take 1 tablet by mouth every other day. [provider] 06/02/2021 Active Self  ?spironolactone (ALDACTONE) 25 MG tablet [id number] No Take 25 mg by mouth daily. [provider] 06/04/2021 Active Self  ?         ?Med Note Joya Salm Nov 21, 2016 11:48 PM)    ?testosterone cypionate (DEPOTESTOSTERONE CYPIONATE) 200 MG/ML injection [id number] No INJECT '200mg'$ (1 ML) IN THE MUSCLE EVERY  70 DAYS [provider] Past Month Active Self  ?traMADol (ULTRAM) 50 MG tablet [id number] No Take by mouth as needed. [provider] 06/04/2021 0900 Active Self  ?warfarin (COUMADIN) 2.5 MG tablet [id number] No Take 2.5 mg by mouth daily. Pt taking 2.5 daily except tuesdays; taking 5 mg tuesdays [provider] 06/03/2021 Active Self  ? ?  ?  ? ?  ?  ?Care Plan : RN Care Manager POC  ?Updates made by Hayden Pedro, RN since 11/02/2021 12:00 AM  ?   ? ?Problem: Chronic Disease Mgmt of Chronic Condition- DM   ?Priority: High  ?  ? ?Long-Range Goal: Development of POC for Mgmt of Chronic Condition-DM   ?Start Date: 08/07/2021  ?Expected End Date: 08/07/2022  ?This Visit's Progress: On track  ?Recent Progress: On track  ?Priority: High  ?Note:   ?Current Barriers:  ?Chronic Disease Management support and education needs related to DMII  ? ?RNCM Clinical Goal(s):  ?Patient will verbalize understanding of plan for management of DMII as evidenced by mgmt of chronic condition ?demonstrate Ongoing health management independence as evidenced by A1C level within normal range ?continue to work with RN Care Manager to address care management and care coordination needs related to  DMII as evidenced by adherence to CM Team Scheduled appointments through collaboration with RN Care manager, provider, and care team.  ? ?Interventions: ?POC sent to PCP upon initial assessment, quarterly and with any changes in patient's conditions ?Inter-disciplinary care team collaboration (see longitudinal plan of care) ?Evaluation of current treatment plan related to  self management and patient's adherence to plan as established by provider ? ? ?Diabetes Interventions:  (Status:  Goal on track:  Yes.) Long Term Goal ?Assessed patient's understanding of A1c goal: <7% ?Provided education to patient about basic DM disease process ?Reviewed medications with patient and discussed importance of medication adherence ?Lab Results  ?Component Value Date  ? HGBA1C 6.9 (H) 11/22/2015  ?09/15/21-Patient reports that his blood sugars have bene controlled. Blood sugar this morning was 110. ?11/02/21-Patient reports cbgs stale. He was just approved for pt assistance for insulins which is helping him out financially a lot.  ? ?Patient Goals/Self-Care Activities: ?Take all medications as prescribed ?Attend all scheduled provider appointments ?Call provider office for new concerns or questions  ?check blood  sugar at prescribed times: twice daily ?take the blood sugar log to all doctor visits ?Check feet daily for any changes ?Consider making an appt with podiatrist ? ?Follow Up Plan:  Telephone follow up appointment with care management team member scheduled for:  within the month of July ?The patient has been provided with contact information for the care management team and has been advised to call with any health related questions or concerns.   ?  ?  ?Plan: ?RN CM discussed with patient next outreach within the month of July. Patient agrees to care plan and follow up. ?RN CM will send quarterly update to PCP.  ? ?Enzo Montgomery, RN,BSN,CCM ?[facility] Care Management ?Telephonic Care Management Coordinator ?Direct Phone: [phone] ?Toll Free: [phone] ?Fax: [phone] ? ?

## 2021-11-13 ENCOUNTER — Other Ambulatory Visit: Payer: Self-pay

## 2021-11-13 DIAGNOSIS — R791 Abnormal coagulation profile: Secondary | ICD-10-CM | POA: Diagnosis not present

## 2021-11-13 NOTE — Patient Outreach (Signed)
[facility]) Care Management ? ?11/13/2021 ? ?Dakota Thomas ?[birth year] ?[id number] ? ? ?Care Coordination ? ? ? ?RN CM received another email from [facility] rep that patient needs help with food. Follow up call to patient. Spoke with patient who reports he is still trying to talk with [facility] personnel and plead his case as to why he should be eligible for their chronic meals programs. He has also called [employer] himself and been given same info as RN CM -that patient only eligible for post discharge program. Patient feels strongly that he should be eligible given his Diabetes.Talked with patient again that meal program is only provided as benefit for certain plans. Offered other food resources to patient (MOWs,food banks/pantries,etc) but patient declined. He voiced that he is able to get food and that's not the issue. He just wants to be eligible for the meal program provided through his [employer].Patient aware that this is not something that RN CM can do/approve but he must discuss this with [facility] team.   ?RN CM made outreach attempt to Carleene Overlie. at [facility] per email received to discuss patient case. HIPAA complaint voicemail message left.  ? ? ?Plan: ?RN CM will outreach to patient as previously discussed/scheduled. ? ?Enzo Montgomery, RN,BSN,CCM ?[facility] Care Management ?Telephonic Care Management Coordinator ?Direct Phone: [phone] ?Toll Free: [phone] ?Fax: [phone] ? ?

## 2021-12-04 DIAGNOSIS — R791 Abnormal coagulation profile: Secondary | ICD-10-CM | POA: Diagnosis not present

## 2021-12-08 DIAGNOSIS — I25810 Atherosclerosis of coronary artery bypass graft(s) without angina pectoris: Secondary | ICD-10-CM | POA: Diagnosis not present

## 2021-12-08 DIAGNOSIS — E1140 Type 2 diabetes mellitus with diabetic neuropathy, unspecified: Secondary | ICD-10-CM | POA: Diagnosis not present

## 2021-12-08 DIAGNOSIS — I255 Ischemic cardiomyopathy: Secondary | ICD-10-CM | POA: Diagnosis not present

## 2021-12-08 DIAGNOSIS — I472 Ventricular tachycardia, unspecified: Secondary | ICD-10-CM | POA: Diagnosis not present

## 2021-12-08 DIAGNOSIS — Z125 Encounter for screening for malignant neoplasm of prostate: Secondary | ICD-10-CM | POA: Diagnosis not present

## 2021-12-08 DIAGNOSIS — I5022 Chronic systolic (congestive) heart failure: Secondary | ICD-10-CM | POA: Diagnosis not present

## 2021-12-08 DIAGNOSIS — Z95810 Presence of automatic (implantable) cardiac defibrillator: Secondary | ICD-10-CM | POA: Diagnosis not present

## 2021-12-08 DIAGNOSIS — I480 Paroxysmal atrial fibrillation: Secondary | ICD-10-CM | POA: Diagnosis not present

## 2021-12-08 DIAGNOSIS — Z794 Long term (current) use of insulin: Secondary | ICD-10-CM | POA: Diagnosis not present

## 2021-12-15 DIAGNOSIS — Z1389 Encounter for screening for other disorder: Secondary | ICD-10-CM | POA: Diagnosis not present

## 2021-12-15 DIAGNOSIS — J449 Chronic obstructive pulmonary disease, unspecified: Secondary | ICD-10-CM | POA: Diagnosis not present

## 2021-12-15 DIAGNOSIS — I10 Essential (primary) hypertension: Secondary | ICD-10-CM | POA: Diagnosis not present

## 2021-12-15 DIAGNOSIS — Z794 Long term (current) use of insulin: Secondary | ICD-10-CM | POA: Diagnosis not present

## 2021-12-15 DIAGNOSIS — E039 Hypothyroidism, unspecified: Secondary | ICD-10-CM | POA: Diagnosis not present

## 2021-12-15 DIAGNOSIS — Z0000 Encounter for general adult medical examination without abnormal findings: Secondary | ICD-10-CM | POA: Diagnosis not present

## 2021-12-15 DIAGNOSIS — E119 Type 2 diabetes mellitus without complications: Secondary | ICD-10-CM | POA: Diagnosis not present

## 2021-12-15 DIAGNOSIS — I513 Intracardiac thrombosis, not elsewhere classified: Secondary | ICD-10-CM | POA: Diagnosis not present

## 2021-12-15 DIAGNOSIS — L719 Rosacea, unspecified: Secondary | ICD-10-CM | POA: Diagnosis not present

## 2021-12-18 DIAGNOSIS — R791 Abnormal coagulation profile: Secondary | ICD-10-CM | POA: Diagnosis not present

## 2021-12-22 DIAGNOSIS — H2513 Age-related nuclear cataract, bilateral: Secondary | ICD-10-CM | POA: Diagnosis not present

## 2021-12-22 DIAGNOSIS — Z0100 Encounter for examination of eyes and vision without abnormal findings: Secondary | ICD-10-CM | POA: Diagnosis not present

## 2022-01-01 DIAGNOSIS — R791 Abnormal coagulation profile: Secondary | ICD-10-CM | POA: Diagnosis not present

## 2022-01-25 DIAGNOSIS — H2511 Age-related nuclear cataract, right eye: Secondary | ICD-10-CM | POA: Diagnosis not present

## 2022-01-30 DIAGNOSIS — I420 Dilated cardiomyopathy: Secondary | ICD-10-CM | POA: Diagnosis not present

## 2022-01-30 DIAGNOSIS — Z7901 Long term (current) use of anticoagulants: Secondary | ICD-10-CM | POA: Diagnosis not present

## 2022-02-05 ENCOUNTER — Other Ambulatory Visit: Payer: Self-pay

## 2022-02-05 NOTE — Patient Outreach (Signed)
[place] [facility]) Care Management  02/05/2022  Dakota Thomas 02/17/1934 [id number]   Telephone Assessment    Voicemail message received from patient returning RN CM all. Return call placed to patient. He voices he is doing well and just returning home from four hours of golfing. He remains very active and independent . Denis any recent falls-has life alert device. Patient shares he had a good vacation/trip to visit family recently. Blood sugars remain controlled an stable. Appetite good. Denies any RN CM needs or concerns at this time.   Medications Reviewed Today     Reviewed by Hayden Pedro, RN (Registered Nurse) on 02/05/22 at 1450  Med List Status: <None>   Medication Order Taking? Sig Documenting Provider Last Dose Status Informant  ALPRAZolam (XANAX) 0.5 MG tablet [id number] No Take 0.5 mg by mouth 2 (two) times daily. [provider] 06/04/2021 0900 Active Self  amiodarone (PACERONE) 400 MG tablet [id number]  Take 400 mg p.o. twice daily x7 days, then resume 100 mg p.o. daily  Patient taking differently: Pt taking only ('100mg'$ )   Annita Brod, MD  Active Self  atorvastatin (LIPITOR) 40 MG tablet [id number] No Take 40 mg by mouth every evening.  [provider] 06/03/2021 1800 Active Self  Calcium Citrate-Vitamin D (CALCIUM CITRATE + D3 MAXIMUM PO) [id number]  Take 1 tablet by mouth. [provider]  Active Self  empagliflozin (JARDIANCE) 10 MG TABS tablet [id number] No Take by mouth daily. [provider] 06/04/2021 0900 Active Self  gabapentin (NEURONTIN) 600 MG tablet [id number] No Take 600 mg by mouth 2 (two) times daily.  [provider] 06/04/2021 0900 Active Self           Med Note [place], JENNIFER L   Thu Nov 24, 2015 10:02 AM)    insulin aspart (NOVOLOG) 100 UNIT/ML FlexPen [id number] No Inject 0-6 Units into the skin 2 (two) times daily before a meal. Dose per sliding scale [provider]  06/04/2021 0900 Active Self  insulin glargine (LANTUS) 100 unit/mL SOPN [id number] No Inject 26 Units into the skin at bedtime. Pt states taking 26 units [provider] 06/03/2021 1800 Active Self           Med Note (ROBINSON, PATRICIA A   Tue Mar 19, 2017  3:12 PM) Took 12 units.  levothyroxine (SYNTHROID, LEVOTHROID) 100 MCG tablet [id number] No Take 100 mcg by mouth daily before breakfast. [provider] 06/04/2021 0900 Active Self  loperamide (IMODIUM A-D) 2 MG capsule [id number]  Take by mouth as needed for diarrhea or loose stools. [provider]  Active Self  Multiple Vitamin (MULTIVITAMIN WITH MINERALS) TABS tablet [id number] No Take 1 tablet by mouth daily. [provider] 06/04/2021 0900 Active Self  omeprazole (PRILOSEC) 20 MG capsule [id number] No Take 20 mg by mouth See admin instructions. Takes '20mg'$  every morning. Takes an additional '20mg'$  in the evening as needed for acid reflux [provider] 06/04/2021 0900 Active Self  potassium gluconate 595 (99 K) MG TABS tablet [id number] No Take 1 tablet by mouth every other day. [provider] 06/02/2021 Active Self  spironolactone (ALDACTONE) 25 MG tablet [id number] No Take 25 mg by mouth daily. [provider] 06/04/2021 Active Self           Med Note Rodney Cruise   Wed Nov 21, 2016 11:48 PM)    testosterone cypionate (DEPOTESTOSTERONE CYPIONATE) 200 MG/ML injection [id number] No INJECT '200mg'$ (1 ML) IN THE MUSCLE EVERY 28 DAYS  [provider] Past Month Active Self  traMADol (ULTRAM) 50 MG tablet [id number] No Take by mouth as needed. [provider] 06/04/2021 0900 Active Self  warfarin (COUMADIN) 2.5 MG tablet [id number] No Take 2.5 mg by mouth daily. Pt taking 2.5 daily except tuesdays; taking 5 mg tuesdays [provider] 06/03/2021 Active Self             Care Plan : RN Care Manager POC  Updates made by Hayden Pedro, RN since  02/05/2022 12:00 AM     Problem: Chronic Disease Mgmt of Chronic Condition- DM   Priority: High     Long-Range Goal: Development of POC for Mgmt of Chronic Condition-DM   Start Date: 08/07/2021  Expected End Date: 08/07/2022  This Visit's Progress: On track  Recent Progress: On track  Priority: High  Note:   Current Barriers:  Chronic Disease Management support and education needs related to DMII   RNCM Clinical Goal(s):  Patient will verbalize understanding of plan for management of DMII as evidenced by mgmt of chronic condition demonstrate Ongoing health management independence as evidenced by A1C level within normal range continue to work with RN Care Manager to address care management and care coordination needs related to  DMII as evidenced by adherence to CM Team Scheduled appointments through collaboration with RN Care manager, provider, and care team.   Interventions: POC sent to PCP upon initial assessment, quarterly and with any changes in patient's conditions Inter-disciplinary care team collaboration (see longitudinal plan of care) Evaluation of current treatment plan related to  self management and patient's adherence to plan as established by provider   Diabetes Interventions:  (Status:  Goal on track:  Yes.) Long Term Goal Assessed patient's understanding of A1c goal: <7% Provided education to patient about basic DM disease process Reviewed medications with patient and discussed importance of medication adherence Lab Results  Component Value Date   HGBA1C 6.9 (H) 11/22/2015  09/15/21-Patient reports that his blood sugars have been controlled. Blood sugar this morning was 110. 11/02/21-Patient reports cbgs stable. He was just approved for pt assistance for insulins which is helping him out financially a lot.  02/05/22-Blood sugars remain stable-patient states Dm wants his goal cbgs to be round 150s to avoid hypoglycemic events.  Patient Goals/Self-Care Activities: Take  all medications as prescribed Attend all scheduled provider appointments Call provider office for new concerns or questions  check blood sugar at prescribed times: twice daily take the blood sugar log to all doctor visits Check feet daily for any changes Consider making an appt with podiatrist  Follow Up Plan:  Telephone follow up appointment with care management team member scheduled for:  within the month of Oct The patient has been provided with contact information for the care management team and has been advised to call with any health related questions or concerns.        Plan: RN CM discussed with patient next outreach within the month of Oct. Patient agrees to care plan and follow up. RN CM will send quarterly update to PCP.   Enzo Montgomery, RN,BSN,CCM [place] Management Telephonic Care Management Coordinator Direct Phone: [phone] Toll Free: [phone] Fax: [phone]

## 2022-02-05 NOTE — Patient Outreach (Signed)
[facility]) Care Management  02/05/2022  SAHIB PELLA 30-Jul-1933 [id number]   Telephone Assessment   Unsuccessful outreach attempt to patient.      Plan: RN CM will make outreach attempt to patient within the month of Aug if no return call.  Enzo Montgomery, RN,BSN,CCM [place] Management Telephonic Care Management Coordinator Direct Phone: [phone] Toll Free: [phone] Fax: [phone]

## 2022-02-07 ENCOUNTER — Encounter: Admission: RE | Payer: Self-pay | Source: Home / Self Care

## 2022-02-07 ENCOUNTER — Ambulatory Visit: Admission: RE | Admit: 2022-02-07 | Payer: Medicare HMO | Source: Home / Self Care | Admitting: Ophthalmology

## 2022-02-07 SURGERY — PHACOEMULSIFICATION, CATARACT, WITH IOL INSERTION
Anesthesia: Topical | Laterality: Left

## 2022-02-20 ENCOUNTER — Ambulatory Visit: Admit: 2022-02-20 | Payer: Medicare HMO | Admitting: Ophthalmology

## 2022-02-20 SURGERY — PHACOEMULSIFICATION, CATARACT, WITH IOL INSERTION
Anesthesia: Topical | Laterality: Right

## 2022-03-05 DIAGNOSIS — Z7901 Long term (current) use of anticoagulants: Secondary | ICD-10-CM | POA: Diagnosis not present

## 2022-03-14 ENCOUNTER — Other Ambulatory Visit: Payer: Self-pay

## 2022-03-14 NOTE — Patient Outreach (Signed)
[place] [facility]) Care Management  03/14/2022  ARNULFO BATSON 06-17-1934 [id number]   Case Closure    Case is being transferred to [place] services. Assigned RN CM will outreach and follow up with patient.     Enzo Montgomery, RN,BSN,CCM [place] Management Telephonic Care Management Coordinator Direct Phone: [phone] Toll Free: [phone] Fax: [phone]

## 2022-03-26 ENCOUNTER — Ambulatory Visit: Payer: Medicare HMO | Admitting: Dermatology

## 2022-03-26 DIAGNOSIS — L820 Inflamed seborrheic keratosis: Secondary | ICD-10-CM

## 2022-03-26 DIAGNOSIS — L821 Other seborrheic keratosis: Secondary | ICD-10-CM

## 2022-03-26 DIAGNOSIS — L570 Actinic keratosis: Secondary | ICD-10-CM

## 2022-03-26 DIAGNOSIS — Z85828 Personal history of other malignant neoplasm of skin: Secondary | ICD-10-CM

## 2022-03-26 DIAGNOSIS — D692 Other nonthrombocytopenic purpura: Secondary | ICD-10-CM

## 2022-03-26 DIAGNOSIS — D1800 Hemangioma unspecified site: Secondary | ICD-10-CM

## 2022-03-26 DIAGNOSIS — Z1283 Encounter for screening for malignant neoplasm of skin: Secondary | ICD-10-CM

## 2022-03-26 DIAGNOSIS — L578 Other skin changes due to chronic exposure to nonionizing radiation: Secondary | ICD-10-CM | POA: Diagnosis not present

## 2022-03-26 DIAGNOSIS — D229 Melanocytic nevi, unspecified: Secondary | ICD-10-CM | POA: Diagnosis not present

## 2022-03-26 DIAGNOSIS — L814 Other melanin hyperpigmentation: Secondary | ICD-10-CM | POA: Diagnosis not present

## 2022-03-26 NOTE — Progress Notes (Unsigned)
Follow-Up Visit   Subjective  Dakota Thomas is a 88 y.o. male who presents for the following: Annual Exam (History of BCC and SCC - The patient presents for Total-Body Skin Exam (TBSE) for skin cancer screening and mole check.  The patient has spots, moles and lesions to be evaluated, some may be new or changing and the patient has concerns that these could be cancer./).  The following portions of the chart were reviewed this encounter and updated as appropriate:   Tobacco  Allergies  Meds  Problems  Med Hx  Surg Hx  Fam Hx     Review of Systems:  No other skin or systemic complaints except as noted in HPI or Assessment and Plan.  Objective  Well appearing patient in no apparent distress; mood and affect are within normal limits.  A full examination was performed including scalp, head, eyes, ears, nose, lips, neck, chest, axillae, abdomen, back, buttocks, bilateral upper extremities, bilateral lower extremities, hands, feet, fingers, toes, fingernails, and toenails. All findings within normal limits unless otherwise noted below.  Right cheek x 2, abdomen x 1 (3) Erythematous stuck-on, waxy papule or plaque  Right Tip of Nose Erythematous thin papules/macules with gritty scale.    Assessment & Plan   History of Basal Cell Carcinoma of the Skin - No evidence of recurrence today - Recommend regular full body skin exams - Recommend daily broad spectrum sunscreen SPF 30+ to sun-exposed areas, reapply every 2 hours as needed.  - Call if any new or changing lesions are noted between office visits  History of Squamous Cell Carcinoma of the Skin - No evidence of recurrence today - No lymphadenopathy - Recommend regular full body skin exams - Recommend daily broad spectrum sunscreen SPF 30+ to sun-exposed areas, reapply every 2 hours as needed.  - Call if any new or changing lesions are noted between office visits  Purpura - Chronic; persistent and recurrent.  Treatable, but  not curable. - Violaceous macules and patches - Benign - Related to trauma, age, sun damage and/or use of blood thinners, chronic use of topical and/or oral steroids - Observe - Can use OTC arnica containing moisturizer such as Dermend Bruise Formula if desired - Call for worsening or other concerns  Lentigines - Scattered tan macules - Due to sun exposure - Benign-appearing, observe - Recommend daily broad spectrum sunscreen SPF 30+ to sun-exposed areas, reapply every 2 hours as needed. - Call for any changes  Seborrheic Keratoses - Stuck-on, waxy, tan-brown papules and/or plaques  - Benign-appearing - Discussed benign etiology and prognosis. - Observe - Call for any changes  Melanocytic Nevi - Tan-brown and/or pink-flesh-colored symmetric macules and papules - Benign appearing on exam today - Observation - Call clinic for new or changing moles - Recommend daily use of broad spectrum spf 30+ sunscreen to sun-exposed areas.   Hemangiomas - Red papules - Discussed benign nature - Observe - Call for any changes  Actinic Damage - Chronic condition, secondary to cumulative UV/sun exposure - diffuse scaly erythematous macules with underlying dyspigmentation - Recommend daily broad spectrum sunscreen SPF 30+ to sun-exposed areas, reapply every 2 hours as needed.  - Staying in the shade or wearing long sleeves, sun glasses (UVA+UVB protection) and wide brim hats (4-inch brim around the entire circumference of the hat) are also recommended for sun protection.  - Call for new or changing lesions.  Skin cancer screening performed today.  Inflamed seborrheic keratosis (3) Right cheek x 2, abdomen  x 1  Destruction of lesion - Right cheek x 2, abdomen x 1 Complexity: simple   Destruction method: cryotherapy   Informed consent: discussed and consent obtained   Timeout:  patient name, date of birth, surgical site, and procedure verified Lesion destroyed using liquid nitrogen: Yes    Region frozen until ice ball extended beyond lesion: Yes   Outcome: patient tolerated procedure well with no complications   Post-procedure details: wound care instructions given    AK (actinic keratosis) Right Tip of Nose  Destruction of lesion - Right Tip of Nose Complexity: simple   Destruction method: cryotherapy   Informed consent: discussed and consent obtained   Timeout:  patient name, date of birth, surgical site, and procedure verified Lesion destroyed using liquid nitrogen: Yes   Region frozen until ice ball extended beyond lesion: Yes   Outcome: patient tolerated procedure well with no complications   Post-procedure details: wound care instructions given     Return if symptoms worsen or fail to improve.  I, Ashok Cordia, CMA, am acting as scribe for Sarina Ser, MD . Documentation: I have reviewed the above documentation for accuracy and completeness, and I agree with the above.  Sarina Ser, MD

## 2022-03-26 NOTE — Patient Instructions (Signed)
Cryotherapy Aftercare  Wash gently with soap and water everyday.   Apply Vaseline and Band-Aid daily until healed.     Due to recent changes in healthcare laws, you may see results of your pathology and/or laboratory studies on MyChart before the doctors have had a chance to review them. We understand that in some cases there may be results that are confusing or concerning to you. Please understand that not all results are received at the same time and often the doctors may need to interpret multiple results in order to provide you with the best plan of care or course of treatment. Therefore, we ask that you please give us 2 business days to thoroughly review all your results before contacting the office for clarification. Should we see a critical lab result, you will be contacted sooner.   If You Need Anything After Your Visit  If you have any questions or concerns for your doctor, please call our main line at [phone] and press option 4 to reach your doctor's medical assistant. If no one answers, please leave a voicemail as directed and we will return your call as soon as possible. Messages left after 4 pm will be answered the following business day.   You may also send us a message via MyChart. We typically respond to MyChart messages within 1-2 business days.  For prescription refills, please ask your pharmacy to contact our office. Our fax number is [phone].  If you have an urgent issue when the clinic is closed that cannot wait until the next business day, you can page your doctor at the number below.    Please note that while we do our best to be available for urgent issues outside of office hours, we are not available 24/7.   If you have an urgent issue and are unable to reach us, you may choose to seek medical care at your doctor's office, retail clinic, urgent care center, or emergency room.  If you have a medical emergency, please immediately call 911 or go to the  emergency department.  Pager Numbers  - Dr. Kowalski: [phone]  - Dr. Moye: [phone]  - Dr. Stewart: [phone]  In the event of inclement weather, please call our main line at [phone] for an update on the status of any delays or closures.  Dermatology Medication Tips: Please keep the boxes that topical medications come in in order to help keep track of the instructions about where and how to use these. Pharmacies typically print the medication instructions only on the boxes and not directly on the medication tubes.   If your medication is too expensive, please contact our office at [phone] option 4 or send us a message through MyChart.   We are unable to tell what your co-pay for medications will be in advance as this is different depending on your insurance coverage. However, we may be able to find a substitute medication at lower cost or fill out paperwork to get insurance to cover a needed medication.   If a prior authorization is required to get your medication covered by your insurance company, please allow us 1-2 business days to complete this process.  Drug prices often vary depending on where the prescription is filled and some pharmacies may offer cheaper prices.  The website www.goodrx.com contains coupons for medications through different pharmacies. The prices here do not account for what the cost may be with help from insurance (it may be cheaper with your insurance), but the website can   give you the price if you did not use any insurance.  - You can print the associated coupon and take it with your prescription to the pharmacy.  - You may also stop by our office during regular business hours and pick up a GoodRx coupon card.  - If you need your prescription sent electronically to a different pharmacy, notify our office through [facility] MyChart or by phone at [phone] option 4.     Si Usted Necesita Algo Despus de Su Visita  Tambin puede  enviarnos un mensaje a travs de MyChart. Por lo general respondemos a los mensajes de MyChart en el transcurso de 1 a 2 das hbiles.  Para renovar recetas, por favor pida a su farmacia que se ponga en contacto con nuestra oficina. Nuestro nmero de fax es el [phone].  Si tiene un asunto urgente cuando la clnica est cerrada y que no puede esperar hasta el siguiente da hbil, puede llamar/localizar a su doctor(a) al nmero que aparece a continuacin.   Por favor, tenga en cuenta que aunque hacemos todo lo posible para estar disponibles para asuntos urgentes fuera del horario de oficina, no estamos disponibles las 24 horas del da, los 7 das de la semana.   Si tiene un problema urgente y no puede comunicarse con nosotros, puede optar por buscar atencin mdica  en el consultorio de su doctor(a), en una clnica privada, en un centro de atencin urgente o en una sala de emergencias.  Si tiene una emergencia mdica, por favor llame inmediatamente al 911 o vaya a la sala de emergencias.  Nmeros de bper  - Dr. Kowalski: [phone]  - Dra. Moye: [phone]  - Dra. Stewart: [phone]  En caso de inclemencias del tiempo, por favor llame a nuestra lnea principal al [phone] para una actualizacin sobre el estado de cualquier retraso o cierre.  Consejos para la medicacin en dermatologa: Por favor, guarde las cajas en las que vienen los medicamentos de uso tpico para ayudarle a seguir las instrucciones sobre dnde y cmo usarlos. Las farmacias generalmente imprimen las instrucciones del medicamento slo en las cajas y no directamente en los tubos del medicamento.   Si su medicamento es muy caro, por favor, pngase en contacto con nuestra oficina llamando al [phone] y presione la opcin 4 o envenos un mensaje a travs de MyChart.   No podemos decirle cul ser su copago por los medicamentos por adelantado ya que esto es diferente dependiendo de la cobertura de su seguro.  Sin embargo, es posible que podamos encontrar un medicamento sustituto a menor costo o llenar un formulario para que el seguro cubra el medicamento que se considera necesario.   Si se requiere una autorizacin previa para que su compaa de seguros cubra su medicamento, por favor permtanos de 1 a 2 das hbiles para completar este proceso.  Los precios de los medicamentos varan con frecuencia dependiendo del lugar de dnde se surte la receta y alguna farmacias pueden ofrecer precios ms baratos.  El sitio web www.goodrx.com tiene cupones para medicamentos de diferentes farmacias. Los precios aqu no tienen en cuenta lo que podra costar con la ayuda del seguro (puede ser ms barato con su seguro), pero el sitio web puede darle el precio si no utiliz ningn seguro.  - Puede imprimir el cupn correspondiente y llevarlo con su receta a la farmacia.  - Tambin puede pasar por nuestra oficina durante el horario de atencin regular y recoger una tarjeta de cupones de GoodRx.  -   Si necesita que su receta se enve electrnicamente a una farmacia diferente, informe a nuestra oficina a travs de MyChart de [place] o por telfono llamando al [phone] y presione la opcin 4.  

## 2022-03-28 ENCOUNTER — Encounter: Payer: Self-pay | Admitting: Dermatology

## 2022-04-04 DIAGNOSIS — Z7901 Long term (current) use of anticoagulants: Secondary | ICD-10-CM | POA: Diagnosis not present

## 2022-05-01 ENCOUNTER — Ambulatory Visit: Payer: Medicare HMO

## 2022-05-01 DIAGNOSIS — I420 Dilated cardiomyopathy: Secondary | ICD-10-CM | POA: Diagnosis not present

## 2022-05-09 DIAGNOSIS — Z95810 Presence of automatic (implantable) cardiac defibrillator: Secondary | ICD-10-CM | POA: Diagnosis not present

## 2022-05-10 DIAGNOSIS — Z794 Long term (current) use of insulin: Secondary | ICD-10-CM | POA: Diagnosis not present

## 2022-05-10 DIAGNOSIS — Z7901 Long term (current) use of anticoagulants: Secondary | ICD-10-CM | POA: Diagnosis not present

## 2022-05-10 DIAGNOSIS — I2510 Atherosclerotic heart disease of native coronary artery without angina pectoris: Secondary | ICD-10-CM | POA: Diagnosis not present

## 2022-05-10 DIAGNOSIS — E039 Hypothyroidism, unspecified: Secondary | ICD-10-CM | POA: Diagnosis not present

## 2022-05-10 DIAGNOSIS — R251 Tremor, unspecified: Secondary | ICD-10-CM | POA: Diagnosis not present

## 2022-05-10 DIAGNOSIS — F419 Anxiety disorder, unspecified: Secondary | ICD-10-CM | POA: Diagnosis not present

## 2022-05-10 DIAGNOSIS — I10 Essential (primary) hypertension: Secondary | ICD-10-CM | POA: Diagnosis not present

## 2022-05-10 DIAGNOSIS — E785 Hyperlipidemia, unspecified: Secondary | ICD-10-CM | POA: Diagnosis not present

## 2022-05-10 DIAGNOSIS — E119 Type 2 diabetes mellitus without complications: Secondary | ICD-10-CM | POA: Diagnosis not present

## 2022-05-31 ENCOUNTER — Encounter: Payer: Medicare HMO | Admitting: *Deleted

## 2022-06-04 ENCOUNTER — Ambulatory Visit: Payer: Self-pay | Admitting: *Deleted

## 2022-06-04 NOTE — Patient Outreach (Signed)
  Care Coordination   06/04/2022 Name: JOVI ZAVADIL MRN: [id number] DOB: December 15, 1933   Care Coordination Outreach Attempts:  An unsuccessful telephone outreach was attempted for a scheduled appointment today.  Follow Up Plan:  Additional outreach attempts will be made to offer the patient care coordination information and services.   Encounter Outcome:  No Answer  Care Coordination Interventions Activated:  No   Care Coordination Interventions:  No, not indicated    Valente David, RN, MSN, [facility] Gastroenterology Care Management Care Management Coordinator [phone]

## 2022-06-15 ENCOUNTER — Telehealth: Payer: Self-pay | Admitting: *Deleted

## 2022-06-15 NOTE — Progress Notes (Signed)
  Care Coordination Note  06/15/2022 Name: Dakota Thomas MRN: [id number] DOB: May 19, 1934  [name] is a 88 y.o. year old male who is a primary care patient of Hande, Cherlyn Labella, MD and is actively engaged with the care management team. I reached out to Wyatt Mage by phone today to assist with re-scheduling a follow up visit with the RN Case Manager  Follow up plan: Patient declines further follow up and engagement by the care management team. Appropriate care team members and provider have been notified via electronic communication.   Pt has relocated to [place] and has since switched pcp's   [name], [place] Direct Dial: [phone]
